# Patient Record
Sex: Female | Born: 1950 | Race: White | Hispanic: No | Marital: Married | Smoking: Former smoker
Health system: Southern US, Community
[De-identification: ages and names within clinical notes are randomized; demographics above are authoritative.]

## PROBLEM LIST (undated history)

## (undated) DIAGNOSIS — J309 Allergic rhinitis, unspecified: Secondary | ICD-10-CM

## (undated) DIAGNOSIS — F329 Major depressive disorder, single episode, unspecified: Secondary | ICD-10-CM

## (undated) DIAGNOSIS — G4700 Insomnia, unspecified: Secondary | ICD-10-CM

## (undated) DIAGNOSIS — I480 Paroxysmal atrial fibrillation: Secondary | ICD-10-CM

## (undated) DIAGNOSIS — E7800 Pure hypercholesterolemia, unspecified: Secondary | ICD-10-CM

## (undated) DIAGNOSIS — K449 Diaphragmatic hernia without obstruction or gangrene: Secondary | ICD-10-CM

## (undated) DIAGNOSIS — M1990 Unspecified osteoarthritis, unspecified site: Secondary | ICD-10-CM

## (undated) DIAGNOSIS — D649 Anemia, unspecified: Secondary | ICD-10-CM

## (undated) DIAGNOSIS — I272 Pulmonary hypertension, unspecified: Secondary | ICD-10-CM

## (undated) DIAGNOSIS — L981 Factitial dermatitis: Secondary | ICD-10-CM

## (undated) DIAGNOSIS — E785 Hyperlipidemia, unspecified: Secondary | ICD-10-CM

## (undated) DIAGNOSIS — M810 Age-related osteoporosis without current pathological fracture: Secondary | ICD-10-CM

## (undated) DIAGNOSIS — R079 Chest pain, unspecified: Secondary | ICD-10-CM

## (undated) DIAGNOSIS — F17200 Nicotine dependence, unspecified, uncomplicated: Secondary | ICD-10-CM

## (undated) DIAGNOSIS — E039 Hypothyroidism, unspecified: Secondary | ICD-10-CM

## (undated) DIAGNOSIS — M51379 Other intervertebral disc degeneration, lumbosacral region without mention of lumbar back pain or lower extremity pain: Secondary | ICD-10-CM

## (undated) DIAGNOSIS — N390 Urinary tract infection, site not specified: Secondary | ICD-10-CM

## (undated) DIAGNOSIS — J189 Pneumonia, unspecified organism: Secondary | ICD-10-CM

## (undated) DIAGNOSIS — D126 Benign neoplasm of colon, unspecified: Secondary | ICD-10-CM

## (undated) DIAGNOSIS — F419 Anxiety disorder, unspecified: Secondary | ICD-10-CM

## (undated) DIAGNOSIS — F3289 Other specified depressive episodes: Secondary | ICD-10-CM

## (undated) DIAGNOSIS — G894 Chronic pain syndrome: Secondary | ICD-10-CM

## (undated) DIAGNOSIS — R319 Hematuria, unspecified: Secondary | ICD-10-CM

## (undated) DIAGNOSIS — L281 Prurigo nodularis: Secondary | ICD-10-CM

## (undated) DIAGNOSIS — R609 Edema, unspecified: Secondary | ICD-10-CM

## (undated) DIAGNOSIS — R739 Hyperglycemia, unspecified: Secondary | ICD-10-CM

## (undated) DIAGNOSIS — E559 Vitamin D deficiency, unspecified: Secondary | ICD-10-CM

## (undated) DIAGNOSIS — IMO0002 Reserved for concepts with insufficient information to code with codable children: Secondary | ICD-10-CM

## (undated) DIAGNOSIS — Z87442 Personal history of urinary calculi: Secondary | ICD-10-CM

## (undated) DIAGNOSIS — K219 Gastro-esophageal reflux disease without esophagitis: Secondary | ICD-10-CM

## (undated) DIAGNOSIS — M5137 Other intervertebral disc degeneration, lumbosacral region: Secondary | ICD-10-CM

## (undated) DIAGNOSIS — M4120 Other idiopathic scoliosis, site unspecified: Secondary | ICD-10-CM

## (undated) DIAGNOSIS — M47817 Spondylosis without myelopathy or radiculopathy, lumbosacral region: Secondary | ICD-10-CM

## (undated) DIAGNOSIS — T148XXA Other injury of unspecified body region, initial encounter: Secondary | ICD-10-CM

## (undated) HISTORY — DX: Other idiopathic scoliosis, site unspecified: M41.20

## (undated) HISTORY — DX: Other specified depressive episodes: F32.89

## (undated) HISTORY — DX: Anxiety disorder, unspecified: F41.9

## (undated) HISTORY — DX: Edema, unspecified: R60.9

## (undated) HISTORY — DX: Paroxysmal atrial fibrillation: I48.0

## (undated) HISTORY — DX: Pulmonary hypertension, unspecified: I27.20

## (undated) HISTORY — DX: Spondylosis without myelopathy or radiculopathy, lumbosacral region: M47.817

## (undated) HISTORY — DX: Personal history of urinary calculi: Z87.442

## (undated) HISTORY — PX: CATARACT EXTRACTION, BILATERAL: SHX1313

## (undated) HISTORY — DX: Benign neoplasm of colon, unspecified: D12.6

## (undated) HISTORY — DX: Pneumonia, unspecified organism: J18.9

## (undated) HISTORY — PX: EYE SURGERY: SHX253

## (undated) HISTORY — DX: Other intervertebral disc degeneration, lumbosacral region: M51.37

## (undated) HISTORY — PX: OTHER SURGICAL HISTORY: SHX169

## (undated) HISTORY — DX: Vitamin D deficiency, unspecified: E55.9

## (undated) HISTORY — DX: Factitial dermatitis: L98.1

## (undated) HISTORY — DX: Hyperlipidemia, unspecified: E78.5

## (undated) HISTORY — DX: Anemia, unspecified: D64.9

## (undated) HISTORY — DX: Other injury of unspecified body region, initial encounter: T14.8XXA

## (undated) HISTORY — DX: Other intervertebral disc degeneration, lumbosacral region without mention of lumbar back pain or lower extremity pain: M51.379

## (undated) HISTORY — DX: Reserved for concepts with insufficient information to code with codable children: IMO0002

## (undated) HISTORY — DX: Hematuria, unspecified: R31.9

## (undated) HISTORY — DX: Gastro-esophageal reflux disease without esophagitis: K21.9

## (undated) HISTORY — DX: Insomnia, unspecified: G47.00

## (undated) HISTORY — DX: Age-related osteoporosis without current pathological fracture: M81.0

## (undated) HISTORY — DX: Unspecified osteoarthritis, unspecified site: M19.90

## (undated) HISTORY — DX: Urinary tract infection, site not specified: N39.0

## (undated) HISTORY — DX: Hyperglycemia, unspecified: R73.9

## (undated) HISTORY — DX: Pure hypercholesterolemia, unspecified: E78.00

## (undated) HISTORY — DX: Prurigo nodularis: L28.1

## (undated) HISTORY — DX: Chronic pain syndrome: G89.4

## (undated) HISTORY — DX: Hypothyroidism, unspecified: E03.9

## (undated) HISTORY — DX: Diaphragmatic hernia without obstruction or gangrene: K44.9

## (undated) HISTORY — PX: ABDOMINAL HYSTERECTOMY: SHX81

## (undated) HISTORY — DX: Chest pain, unspecified: R07.9

## (undated) HISTORY — DX: Major depressive disorder, single episode, unspecified: F32.9

## (undated) HISTORY — DX: Nicotine dependence, unspecified, uncomplicated: F17.200

## (undated) HISTORY — DX: Allergic rhinitis, unspecified: J30.9

---
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
## 1978-02-24 HISTORY — PX: CERVICAL SPINE SURGERY: SHX589

## 1978-02-24 HISTORY — PX: OTHER SURGICAL HISTORY: SHX169

## 1988-02-25 HISTORY — PX: VAGINAL HYSTERECTOMY: SHX2639

## 2004-02-25 HISTORY — PX: BUNIONECTOMY: SHX129

## 2009-01-08 ENCOUNTER — Ambulatory Visit: Payer: Self-pay | Admitting: Anesthesiology

## 2009-01-12 ENCOUNTER — Ambulatory Visit: Payer: Self-pay | Admitting: Anesthesiology

## 2009-01-29 ENCOUNTER — Ambulatory Visit: Payer: Self-pay | Admitting: Anesthesiology

## 2009-03-01 ENCOUNTER — Ambulatory Visit: Payer: Self-pay | Admitting: Anesthesiology

## 2009-03-30 ENCOUNTER — Ambulatory Visit: Payer: Self-pay | Admitting: Anesthesiology

## 2009-04-17 LAB — HM DEXA SCAN

## 2009-04-25 ENCOUNTER — Ambulatory Visit: Payer: Self-pay | Admitting: Family Medicine

## 2009-04-27 ENCOUNTER — Ambulatory Visit: Payer: Self-pay | Admitting: Anesthesiology

## 2009-05-15 ENCOUNTER — Ambulatory Visit: Payer: Self-pay | Admitting: Anesthesiology

## 2009-06-21 ENCOUNTER — Ambulatory Visit: Payer: Self-pay | Admitting: Anesthesiology

## 2009-07-02 ENCOUNTER — Ambulatory Visit: Payer: Self-pay | Admitting: Anesthesiology

## 2009-07-12 ENCOUNTER — Ambulatory Visit: Payer: Self-pay | Admitting: Family Medicine

## 2009-08-02 ENCOUNTER — Ambulatory Visit: Payer: Self-pay | Admitting: General Practice

## 2009-08-02 ENCOUNTER — Encounter: Payer: Self-pay | Admitting: Cardiovascular Disease

## 2009-08-08 ENCOUNTER — Encounter: Payer: Self-pay | Admitting: Cardiovascular Disease

## 2009-08-10 ENCOUNTER — Encounter: Payer: Self-pay | Admitting: Cardiovascular Disease

## 2009-08-10 ENCOUNTER — Ambulatory Visit: Payer: Self-pay | Admitting: Specialist

## 2009-08-22 ENCOUNTER — Ambulatory Visit: Payer: Self-pay | Admitting: Anesthesiology

## 2009-08-23 ENCOUNTER — Ambulatory Visit: Payer: Self-pay | Admitting: Specialist

## 2009-09-26 ENCOUNTER — Encounter: Payer: Self-pay | Admitting: Cardiovascular Disease

## 2009-10-04 ENCOUNTER — Ambulatory Visit: Payer: Self-pay | Admitting: Anesthesiology

## 2009-10-22 ENCOUNTER — Ambulatory Visit: Payer: Self-pay | Admitting: Family Medicine

## 2009-10-22 ENCOUNTER — Encounter: Payer: Self-pay | Admitting: Cardiovascular Disease

## 2009-11-13 ENCOUNTER — Ambulatory Visit: Payer: Self-pay | Admitting: Cardiovascular Disease

## 2009-11-13 DIAGNOSIS — R609 Edema, unspecified: Secondary | ICD-10-CM

## 2009-11-13 DIAGNOSIS — R079 Chest pain, unspecified: Secondary | ICD-10-CM

## 2009-11-13 DIAGNOSIS — R0602 Shortness of breath: Secondary | ICD-10-CM | POA: Insufficient documentation

## 2009-11-13 DIAGNOSIS — R5383 Other fatigue: Secondary | ICD-10-CM

## 2009-11-13 DIAGNOSIS — R5381 Other malaise: Secondary | ICD-10-CM | POA: Insufficient documentation

## 2009-11-13 DIAGNOSIS — E785 Hyperlipidemia, unspecified: Secondary | ICD-10-CM | POA: Insufficient documentation

## 2009-11-14 ENCOUNTER — Ambulatory Visit: Payer: Self-pay | Admitting: Family Medicine

## 2009-11-14 ENCOUNTER — Ambulatory Visit: Payer: Self-pay | Admitting: Anesthesiology

## 2009-12-03 ENCOUNTER — Ambulatory Visit: Payer: Self-pay | Admitting: Specialist

## 2009-12-20 ENCOUNTER — Ambulatory Visit: Payer: Self-pay | Admitting: Anesthesiology

## 2010-01-29 ENCOUNTER — Ambulatory Visit: Payer: Self-pay | Admitting: Family Medicine

## 2010-02-11 ENCOUNTER — Ambulatory Visit: Payer: Self-pay

## 2010-02-24 HISTORY — PX: OTHER SURGICAL HISTORY: SHX169

## 2010-03-26 NOTE — Letter (Signed)
Summary: [facility] - CT Abdomen & Pelvis W/WO  [facility] - CT Abdomen & Pelvis W/WO   Imported By: Marylou Mccoy 11/28/2009 12:01:11  _____________________________________________________________________  External Attachment:    Type:   Image     Comment:   External Document

## 2010-03-26 NOTE — Letter (Signed)
Summary: [facility] - [facility] Chest PA and LAT  [facility] - [facility] Chest PA and LAT   Imported By: Marylou Mccoy 11/28/2009 11:58:16  _____________________________________________________________________  External Attachment:    Type:   Image     Comment:   External Document

## 2010-03-26 NOTE — Letter (Signed)
Summary: [name] - Stress   [facility] - Stress   Imported By: Marylou Mccoy 11/28/2009 12:00:03  _____________________________________________________________________  External Attachment:    Type:   Image     Comment:   External Document

## 2010-03-26 NOTE — Assessment & Plan Note (Signed)
Summary: NEW PT   Visit Type:  Initial Consult Primary Provider:  Krisit Smith,M.D.  CC:  c/o swelling in legs and feet and ankles with shortness of breath.  Has chest pain that awakes her in Sonya night. .  History of Present Illness: Sonya [name] is a 60 year old woman with a long history of smoking who stopped 4 years ago, chronic back pain with Harrington rods placed, anemia with iron deficiency, emphysema seen on CT scan, recent pneumonia x2 in May and August of this year per her report with recent symptoms of swelling in her legs, previous workup with echocardiogram and stress test who presents for evaluation for fatigue, shortness of breath and edema.  She reports that her edema has improved on Lasix b.i.d. She does drink a significant amount of fluid. She's had a difficult year. She is seen by [facility] pain clinic and has cortisone shots in her back and neck. Her weight is up 30 pounds from her usual weight. She is very fatigued, has shortness of breath with exertion. She's also had rare episodes of chest pain lasting sometimes up to 10 minutes to come on while she is sleeping. She does not have any symptoms of chest pain with exertion. She reports having PFTs in Sonya past with Dr. Meredeth Ide though she is uncertain of Sonya results.  She does not do very much in Sonya daytime. When asked if she might be depressed, she certainly says that that could be possible.  Recent labs from August 3 showed total cholesterol 144, HDL 49, LDL 79, triglycerides 79  EKG shows normal sinus rhythm with rate of 82 beats per minute, no significant ST-T wave changes  stress test September 26 2009 showed no ischemia, poor exercise tolerance for her age, ejection fraction 71%  Echocardiogram done June 2011 shows normal systolic function, mild MR, normal right systolic function and pressure    Preventive Screening-Counseling & Management  Alcohol-Tobacco     Smoking Status: quit  Caffeine-Diet-Exercise     Does [name]  Exercise: yes  Current Medications (verified): 1)  Boniva 150 Mg Tabs (Ibandronate Sodium) .... Once A Month 2)  Synthroid 88 Mcg Tabs (Levothyroxine Sodium) .... One Tablet Once Daily 3)  Nexium 40 Mg Cpdr (Esomeprazole Magnesium) .... One Tablet Once Daily 4)  Furosemide 40 Mg Tabs (Furosemide) .... Two Tablets Once Daily 5)  Crestor 40 Mg Tabs (Rosuvastatin Calcium) .... One Tablet At Bedtime 6)  Klor-Con M20 20 Meq Cr-Tabs (Potassium Chloride Crys Cr) .... One Tablet Once Daily 7)  Iron 325 (65 Fe) Mg Tabs (Ferrous Sulfate) .... One Tablet Once Daily 8)  Caltrate 600+d 600-400 Mg-Unit Chew (Calcium Carbonate-Vitamin D) .... One Tablet Once Daily 9)  Stool Softener 100 Mg Caps (Docusate Sodium) .... One Tablet Once Daily 10)  Lyrica 75 Mg Caps (Pregabalin) .... One Tablet At Bedtime 11)  Klonopin 1 Mg Tabs (Clonazepam) .... Three Times A Day or Every 8 Hours 12)  Ultram 50 Mg Tabs (Tramadol Hcl) .... Two Tablets Four Times A Day 13)  Oxycontin 30 Mg Xr12h-Tab (Oxycodone Hcl) .... One Tablet Three Times A Day For Back Pain 14)  Amirix 15 Mg  Allergies (verified): 1)  ! Sulfa 2)  ! Levaquin  Past History:  Family History: Last updated: November 19, 2009 Father: CHF age 44 deceased Mother:living;pacemaker and CHF.   Siblings: 4 sisters living; 1 has a stent.                4 brothers living; 3 hx. of  CAD; 1 with CABG x 4 and Sonya others all have stents.  Social History: Last updated: 11/13/2009 Retired  Married  Tobacco Use - Former. quit Nov.2007. Regular Exercise - yes--recently not able to. Alcohol Use - no  Risk Factors: Exercise: yes (11/13/2009)  Risk Factors: Smoking Status: quit (11/13/2009)  Past Medical History: Hyperlipidemia emphysema anxiety arthritis GERD anemia hiatal hernia frequent UTI's  Past Surgical History: hysterectomy two harrington rods in back 1980  Family History: Father: CHF age 22 deceased Mother:living;pacemaker and  CHF.   Siblings: 4 sisters living; 1 has a stent.                4 brothers living; 3 hx. of CAD; 1 with CABG x 4 and Sonya others all have stents.  Social History: Retired  Married  Tobacco Use - Former. quit Nov.2007. Regular Exercise - yes--recently not able to. Alcohol Use - no Smoking Status:  quit Does [name] Exercise:  yes  Review of Systems       Sonya [name] complains of weight gain, chest pain, dyspnea on exertion, and peripheral edema.  Sonya [name] denies fever, weight loss, vision loss, decreased hearing, hoarseness, syncope, prolonged cough, abdominal pain, incontinence, muscle weakness, depression, and enlarged lymph nodes.    Vital Signs:  [name] profile:   60 year old [name] Height:      63 inches Weight:      179 pounds BMI:     31.82 Pulse rate:   83 / minute BP sitting:   117 / 79  (left arm) Cuff size:   regular  Vitals Entered By: Bishop Dublin, CMA (November 13, 2009 3:20 PM)  Physical Exam  General:  Well developed, well nourished, in no acute distress. Head:  normocephalic and atraumatic Neck:  Neck supple, no JVD. No masses, thyromegaly or abnormal cervical nodes. Lungs:  Clear bilaterally to auscultation and percussion. Heart:  Non-displaced PMI, chest non-tender; regular rate and rhythm, S1, S2 with I-II/VI SEM RSB, no rubs or gallops. Carotid upstroke normal, no bruit.  Pedals normal pulses. No edema, no varicosities. Abdomen:  abdomen soft and non-tender without masses Msk:  Back normal, normal gait. Muscle strength and tone normal. Pulses:  pulses normal in all 4 extremities Extremities:  No clubbing or cyanosis. Neurologic:  Alert and oriented x 3. Skin:  Intact without lesions or rashes. Psych:  Normal affect.   Impression & Recommendations:  Problem # 1:  FATIGUE / MALAISE (ICD-780.79) after a long discussion of her symptoms, her previous history and her care over Sonya past year, I suspect that there is no underlying cardiac issues  that needs to be addressed.  I am concerned about her weight gain of 30 pounds and how this has affected her conditioning, her breathing in Sonya setting of underlying emphysema. She is not exercising. She has had long periods of sedentary activity given her recent pneumonias. I've encouraged her to increase her activity as tolerated now that Sonya weather has improved. I believe Sonya hot weather may have also affected her breathing. She does have some symptoms concerning for depression and I will defer this to Dr. Katrinka Blazing.  Her cholesterol is well controlled on Crestor 40 mg daily. She did mention that she would like to change back to Lipitor. We will start her on aspirin 81 mg daily.   Problem # 2:  CHEST PAIN-UNSPECIFIED (ICD-786.50) etiology of her chest pain is likely noncardiac. She has no symptoms with exertion. She has had rare episodes of chest  pain coming on at rest waking her from sleep it lasted for up to 10 minutes. We have given her a prescription for nitroglycerin to be taken p.r.n. Asked her to contact me if she has more frequent  episodes of Chest discomfort.  Her updated medication list for this problem includes:    Nitrostat 0.4 Mg Subl (Nitroglycerin) .Marland Kitchen... 1 tablet under tongue at onset of chest pain; you may repeat every 5 minutes for up to 3 doses.    Aspirin 81 Mg Tbec (Aspirin) .Marland Kitchen... Take one tablet by mouth daily  Problem # 3:  EDEMA (ICD-782.3) she does drink a significant amount of fluid and I states that she cut back on her walker intake. Currently she has no edema today. She made be able to take one Lasix today with a second Lasix p.r.n. for edema  Problem # 4:  DYSPNEA (ICD-786.05) I did talk to her about her shortness of breath. I feel she is deconditioned, has underlying emphysema, has had a 30 pound weight gain which is affecting her conditioning. I've encouraged her to start walking more now that Sonya weather is improved. She may need a long-acting steroid or beta  inhaler.  Her updated medication list for this problem includes:    Furosemide 40 Mg Tabs (Furosemide) .Marland Kitchen..Marland Kitchen Two tablets once daily    Aspirin 81 Mg Tbec (Aspirin) .Marland Kitchen... Take one tablet by mouth daily  [name] Instructions: 1)  Your physician has recommended you make Sonya following change in your medication: Nitro for chest pain, START aspirin 81mg   2)  Your physician wants you to follow-up in:   6 months You will receive a reminder letter in Sonya mail two months in advance. If you don't receive a letter, please call our office to schedule Sonya follow-up appointment. Prescriptions: NITROSTAT 0.4 MG SUBL (NITROGLYCERIN) 1 tablet under tongue at onset of chest pain; you may repeat every 5 minutes for up to 3 doses.  #25 x 2   Entered by:   Benedict Needy, RN   Authorized by:   Dossie Arbour MD   Signed by:   Benedict Needy, RN on 11/13/2009   Method used:   Print then Give to [name]   RxID:   [id number] CRESTOR 40 MG TABS (ROSUVASTATIN CALCIUM) one tablet at bedtime  #30 x 12   Entered by:   Benedict Needy, RN   Authorized by:   Dossie Arbour MD   Signed by:   Benedict Needy, RN on 11/13/2009   Method used:   Print then Give to [name]   RxID:   [id number]

## 2010-03-26 NOTE — Letter (Signed)
Summary: [facility] - [place] Color Flow Doppler Low Extrem  [facility] - [place] Color Flow Doppler Low Extrem   Imported By: Marylou Mccoy 11/28/2009 11:59:11  _____________________________________________________________________  External Attachment:    Type:   Image     Comment:   External Document

## 2010-03-26 NOTE — Letter (Signed)
Summary: [name] - [place]  [facility] - [place]   Imported By: Marylou Mccoy 11/28/2009 14:30:00  _____________________________________________________________________  External Attachment:    Type:   Image     Comment:   External Document

## 2010-03-27 ENCOUNTER — Ambulatory Visit: Payer: Self-pay | Admitting: Specialist

## 2010-04-11 LAB — HM PAP SMEAR

## 2010-04-30 ENCOUNTER — Ambulatory Visit: Payer: Self-pay | Admitting: Family Medicine

## 2010-06-24 ENCOUNTER — Ambulatory Visit: Payer: Self-pay | Admitting: Unknown Physician Specialty

## 2010-09-18 ENCOUNTER — Encounter: Payer: Self-pay | Admitting: Cardiovascular Disease

## 2011-06-16 ENCOUNTER — Ambulatory Visit: Payer: Self-pay | Admitting: Physical Medicine and Rehabilitation

## 2011-07-31 LAB — HM DEXA SCAN

## 2011-07-31 LAB — HEMOGLOBIN A1C: Hgb A1c MFr Bld: 5.9 % (ref 4.0–6.0)

## 2011-07-31 LAB — LIPID PANEL
Cholesterol: 180 mg/dL (ref 0–200)
HDL: 52 mg/dL (ref 35–70)

## 2011-09-04 ENCOUNTER — Ambulatory Visit: Payer: Self-pay | Admitting: Family Medicine

## 2011-09-04 LAB — HM MAMMOGRAPHY

## 2011-11-07 ENCOUNTER — Encounter: Payer: Self-pay | Admitting: *Deleted

## 2011-11-10 ENCOUNTER — Encounter: Payer: Self-pay | Admitting: Family Medicine

## 2011-11-10 ENCOUNTER — Ambulatory Visit (INDEPENDENT_AMBULATORY_CARE_PROVIDER_SITE_OTHER): Payer: BC Managed Care – PPO | Admitting: Family Medicine

## 2011-11-10 VITALS — BP 140/83 | HR 84 | Temp 97.8°F | Resp 18 | Ht 62.0 in | Wt 189.0 lb

## 2011-11-10 DIAGNOSIS — J309 Allergic rhinitis, unspecified: Secondary | ICD-10-CM

## 2011-11-10 DIAGNOSIS — M8580 Other specified disorders of bone density and structure, unspecified site: Secondary | ICD-10-CM

## 2011-11-10 DIAGNOSIS — R7302 Impaired glucose tolerance (oral): Secondary | ICD-10-CM

## 2011-11-10 DIAGNOSIS — E039 Hypothyroidism, unspecified: Secondary | ICD-10-CM

## 2011-11-10 DIAGNOSIS — L981 Factitial dermatitis: Secondary | ICD-10-CM

## 2011-11-10 DIAGNOSIS — Z23 Encounter for immunization: Secondary | ICD-10-CM

## 2011-11-10 DIAGNOSIS — J449 Chronic obstructive pulmonary disease, unspecified: Secondary | ICD-10-CM | POA: Insufficient documentation

## 2011-11-10 DIAGNOSIS — R609 Edema, unspecified: Secondary | ICD-10-CM

## 2011-11-10 DIAGNOSIS — E785 Hyperlipidemia, unspecified: Secondary | ICD-10-CM

## 2011-11-10 NOTE — Progress Notes (Signed)
Subjective:    Patient ID: Sonya [name], female    DOB: 09/10/1950, 61 y.o.   MRN: [id number]  HPIThis 61 y.o. female presents for evaluation of the following:  1.  Neurotic Excoriations:  Appointment with dermatology this week.  Started developing lesions on BLE.  Lucila Maine was climbing on patient over the weekend; wrapped legs last.  Feeling like getting worse.  Painful.  Initial consult with [facility].  Now has transferred to [facility].  Very tender and raw.   Concerned about melanoma.  Usually keeps arms wrapped.  When returned from [place] two weeks ago, L arm almost clear.  Will bump arms and large wound will develop.  Kendell Bane is closer.  Started six months to 12 months ago.   No itching.  +drainage.  Applying vaseline and then wrapping.  Not currently taking Doxycycline.   2.  Edema: swollen today.  No swelling yesterday.  +SOB this morning. Usually taking Lasix once daily; with bad day, will take second dose.   Did go out and mow yesterday.    3.  COPD:  Follow-up with Meredeth Ide on December 01, 2011.  Feels really stuffy and short of breath.  Agreeable to flu vaccine.  Followed every eight months.    4.  Constipation: having horrible constipation.  Did have small bowel movement today; first bm in four days.  Taking stool softener scheduled.  No laxative use.  Drinking plenty of water.   Chronic issue with constipation with recent worsening.  No abdominal pain but +bloating.  5. DDD Lumbar Spine:  Follow up with [facility] this week.  Followed every three months.  Stable.  6.  Anxiety and depression:  Next appointment with [facility] this month.  Every three months.  Stable.  7.  Hypercholesterolemia: three month follow-up; no change in management at last visit.  Good compliance with medication, good tolerance to medication; good symptom control.  Denies chest pain, palpitations, dizziness, focal weakness, or paresthesias.  8. 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      Review of Systems  Constitutional: Positive for fatigue. Negative for fever, chills and diaphoresis.  HENT: Positive for congestion, sneezing and postnasal drip. Negative for rhinorrhea.   Respiratory: Positive for cough and shortness of breath. Negative for wheezing.   Cardiovascular: Positive for leg swelling. Negative for chest pain and palpitations.  Gastrointestinal: Positive for constipation and abdominal distention. Negative for nausea, vomiting, abdominal pain and diarrhea.  Musculoskeletal: Positive for back pain.  Skin: Positive for color change, rash and wound.  Neurological: Negative for dizziness, facial asymmetry, light-headedness, numbness and headaches.  Hematological: Bruises/bleeds easily.  Psychiatric/Behavioral: The patient is nervous/anxious.        Objective:   Physical Exam  Nursing note and vitals reviewed. Constitutional: She is oriented to person, place, and time. She appears well-developed and well-nourished. No distress.  HENT:  Head: Normocephalic and atraumatic.  Eyes: Conjunctivae normal are normal. Pupils are equal, round, and reactive to light.  Neck: Normal range of motion. Neck supple. No thyromegaly present.  Cardiovascular: Normal rate, regular rhythm, normal heart sounds and intact distal pulses.   No murmur heard. Pulmonary/Chest: Effort normal and breath sounds normal. No respiratory distress. She has no wheezes. She has no rales.  Abdominal: Soft. Bowel sounds are normal. She exhibits no distension. There is no tenderness. There is no rebound and no guarding.  Lymphadenopathy:    She has no cervical adenopathy.  Neurological: She is alert and oriented to  person, place, and time. No cranial nerve deficit. She exhibits normal muscle tone.  Skin: Skin is warm and dry. She is not diaphoretic.       B FOREARMS:  2CM X 3CM AREA OF HEALING ESCHAR WITH MINIMAL ERYTHEMA PRESENT.  MULTIPLE SCATTERED LESIONS B FOREARMS.     LLE:  4 DISCRETE AREAS DIAMETER OF HEALING ESCHAR IN ANNULAR DISTRIBUTION.   RLE: LINEAR EXCORIATIONS MULTIPLE LATERALLY.  Psychiatric: She has a normal mood and affect. Her behavior is normal. Judgment and thought content normal.   FLU VACCINE ADMINISTERED.     Assessment & Plan:   1. Need for influenza vaccination  Flu vaccine greater than or equal to 3yo preservative free IM  2. COPD (chronic obstructive pulmonary disease)    3. Neurotic excoriations    4. Edema    5. Allergic rhinitis, cause unspecified    6.  Hypercholesterolemia. 7. Glucose Intolerance  1.  COPD:  Stable; followed by pulmonology; no change in management at this time; stable pulse oximetry at 95%.   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  Pt concerned of other etiology; insists that she is not scratching or picking skin. Follow up with dermatology this week. Continue to wrap forearms and legs.  3.  Edema: Chronic with worsening; to increase Lasix to bid for next several days.   4.  Allergic Rhinitis: worsening; to continue with Allegra and Flonase; to add nasal saline spray PRN. 5.  S/p influenza vaccine. 6.  Hypercholesterolemia: controlled at last visit; RTC three months for fasting labs. 7.  Glucose Intolerance:  Stable; repeat labs at next visit; continue with attempts at weight loss, exercise, low-carb food choices.

## 2011-11-11 ENCOUNTER — Encounter: Payer: Self-pay | Admitting: Family Medicine

## 2011-11-11 NOTE — Progress Notes (Signed)
Reviewed and agree.

## 2011-11-13 ENCOUNTER — Encounter: Payer: Self-pay | Admitting: *Deleted

## 2011-11-26 ENCOUNTER — Encounter: Payer: Self-pay | Admitting: Family Medicine

## 2012-01-01 ENCOUNTER — Telehealth: Payer: Self-pay

## 2012-01-01 MED ORDER — POTASSIUM CHLORIDE CRYS ER 20 MEQ PO TBCR: 20.0000 meq | EXTENDED_RELEASE_TABLET | Freq: Every day | ORAL | Status: DC

## 2012-01-01 NOTE — Telephone Encounter (Signed)
Rx sent 

## 2012-01-01 NOTE — Telephone Encounter (Signed)
PATIENT NEEDS A REFILL ON potassium chloride SA (K-DUR,KLOR-CON) 20 MEQ tablet TO FAX TO [facility]. PATIENT SEES DR.K SMITH. THANK YOU!

## 2012-01-03 NOTE — Telephone Encounter (Signed)
Advised pt that rx was sent in

## 2012-01-14 ENCOUNTER — Ambulatory Visit: Payer: Self-pay | Admitting: Orthopedic Surgery

## 2012-01-19 ENCOUNTER — Ambulatory Visit: Payer: Self-pay | Admitting: Specialist

## 2012-02-06 ENCOUNTER — Ambulatory Visit: Payer: Self-pay | Admitting: Orthopedic Surgery

## 2012-02-09 ENCOUNTER — Encounter: Payer: Self-pay | Admitting: Family Medicine

## 2012-02-09 ENCOUNTER — Ambulatory Visit (INDEPENDENT_AMBULATORY_CARE_PROVIDER_SITE_OTHER): Payer: BC Managed Care – PPO | Admitting: Family Medicine

## 2012-02-09 VITALS — BP 110/70 | HR 88 | Temp 98.2°F | Resp 16 | Ht 62.0 in | Wt 188.0 lb

## 2012-02-09 DIAGNOSIS — R7309 Other abnormal glucose: Secondary | ICD-10-CM

## 2012-02-09 DIAGNOSIS — R609 Edema, unspecified: Secondary | ICD-10-CM

## 2012-02-09 DIAGNOSIS — R7302 Impaired glucose tolerance (oral): Secondary | ICD-10-CM

## 2012-02-09 DIAGNOSIS — E7800 Pure hypercholesterolemia, unspecified: Secondary | ICD-10-CM

## 2012-02-09 DIAGNOSIS — J449 Chronic obstructive pulmonary disease, unspecified: Secondary | ICD-10-CM

## 2012-02-09 DIAGNOSIS — M1710 Unilateral primary osteoarthritis, unspecified knee: Secondary | ICD-10-CM

## 2012-02-09 DIAGNOSIS — M170 Bilateral primary osteoarthritis of knee: Secondary | ICD-10-CM

## 2012-02-09 DIAGNOSIS — E785 Hyperlipidemia, unspecified: Secondary | ICD-10-CM

## 2012-02-09 DIAGNOSIS — L981 Factitial dermatitis: Secondary | ICD-10-CM

## 2012-02-09 LAB — LIPID PANEL
HDL: 45 mg/dL (ref >39)
LDL Cholesterol: 106 mg/dL — ABNORMAL HIGH (ref 0–99)
Triglycerides: 145 mg/dL (ref <150)

## 2012-02-09 LAB — CBC WITH DIFFERENTIAL/PLATELET
Basophils Absolute: 0 10*3/uL (ref 0.0–0.1)
HCT: 42.1 % (ref 36.0–46.0)
Lymphocytes Relative: 19 % (ref 12–46)
Neutro Abs: 6.1 10*3/uL (ref 1.7–7.7)
Platelets: 343 10*3/uL (ref 150–400)
RBC: 4.73 MIL/uL (ref 3.87–5.11)
RDW: 13.9 % (ref 11.5–15.5)
WBC: 8.5 10*3/uL (ref 4.0–10.5)

## 2012-02-09 LAB — COMPREHENSIVE METABOLIC PANEL
ALT: 23 U/L (ref 0–35)
AST: 27 U/L (ref 0–37)
Albumin: 4.2 g/dL (ref 3.5–5.2)
Alkaline Phosphatase: 92 U/L (ref 39–117)
Potassium: 4.1 mEq/L (ref 3.5–5.3)
Sodium: 141 mEq/L (ref 135–145)
Total Protein: 6 g/dL (ref 6.0–8.3)

## 2012-02-09 MED ORDER — POTASSIUM CHLORIDE CRYS ER 20 MEQ PO TBCR: 20.0000 meq | EXTENDED_RELEASE_TABLET | Freq: Every day | ORAL | Status: DC

## 2012-02-09 MED ORDER — LEVOTHYROXINE SODIUM 88 MCG PO TABS: 88.0000 ug | ORAL_TABLET | Freq: Every day | ORAL | Status: DC

## 2012-02-09 MED ORDER — FUROSEMIDE 40 MG PO TABS: 40.0000 mg | ORAL_TABLET | Freq: Every day | ORAL | Status: DC

## 2012-02-09 MED ORDER — ESOMEPRAZOLE MAGNESIUM 40 MG PO CPDR: 40.0000 mg | DELAYED_RELEASE_CAPSULE | Freq: Every day | ORAL | Status: DC

## 2012-02-09 MED ORDER — ATORVASTATIN CALCIUM 40 MG PO TABS: 40.0000 mg | ORAL_TABLET | Freq: Every day | ORAL | Status: DC

## 2012-02-09 NOTE — Progress Notes (Signed)
[address]   [place], [place]  [address]   [phone]  Subjective:    Patient ID: Sonya [name], female    DOB: 08-Jul-1950, 61 y.o.   MRN: [id number]  HPIThis 61 y.o. female presents for three month follow-up:  1.  Hyperlipidemia:  Fasting.  Six month follow-up; no changes to management made at last visit.  Reports compliance with medication; good tolerance to medication; good symptom control. Denies chest pain, palpitations, worsening SOB, worsening leg swelling. Denies HA, dizziness, focal weakness, paresthesias.  2.  Glucose Intolerance:  Fasting.  Six month follow-up; continues to gain weight.    3. COPD:  S/p evaluation by Meredeth Ide in past three months; repeated sleep study; did nocturnal oximetry with concerning findings.  No evidence of OSA; no need for CPAP.  No changes to management made at pulmonology visit.  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  Healing wounds LLE, RLE.   S/p dermatology follow-up since last visit; has upcoming appointment as well.  5. DDD Lumbar:  Appointment with [facility] today at 12:45.  Issues stable.  6. Edema:  Stable since last visit without worsening.  Denies orthopnea, chest pain, palpitations.  7. OA knees:  Going to have TKR as soon as can be scheduled; also has torn meniscus.   S/p Synvisc in B knees.  Scared to have knee replacement by Rosita Kea.  Needs pre-operative clearance.  No chest pains, palpitations.  +SOB due to COPD; DOE depends on the day. Has steep steps at home; sixteen steps in home; double two story steps; able to walk up steps without stopping.  Recent follow-up with Meredeth Ide for COPD.  S/p MRI knees.  No spirometry since 02/2011.     Review of Systems  Constitutional: Negative for fever, chills, diaphoresis and fatigue.  Respiratory: Positive for shortness of breath. Negative for apnea, cough, chest tightness, wheezing and stridor.   Cardiovascular: Positive for leg swelling. Negative for chest  pain and palpitations.  Gastrointestinal: Positive for constipation. Negative for nausea, vomiting, abdominal pain, diarrhea, blood in stool, abdominal distention and anal bleeding.  Musculoskeletal: Positive for back pain and arthralgias.  Skin: Positive for rash and wound.        Past Medical History  Diagnosis Date  . HLD (hyperlipidemia)   . Emphysema   . Anxiety   . Arthritis   . GERD (gastroesophageal reflux disease)   . Anemia   . Hiatal hernia   . UTI (urinary tract infection)     frequent  . Unspecified hypothyroidism   . Hyperglycemia   . Neurotic excoriations   . Squamous cell carcinoma   . Depressive disorder, not elsewhere classified   . Bruising   . Benign neoplasm of colon   . Degeneration of lumbar or lumbosacral intervertebral disc   . Personal history of urinary calculi   . Scoliosis (and kyphoscoliosis), idiopathic   . Tobacco use disorder   . Osteoporosis, unspecified   . Insomnia, unspecified   . Edema   . Pure hypercholesterolemia   . Chronic pain syndrome   . Lumbosacral spondylosis without myelopathy   . Allergic rhinitis, cause unspecified   . Hematuria, unspecified   . Unspecified vitamin D deficiency   . Prurigo nodularis   . Pneumonia     Past Surgical History  Procedure Date  . Vaginal hysterectomy 1990  . 2 harrington rods in back 1980  . Tonsillectomy and adenoidectomy 1964  . [place]ervical spine surgery 1980  [place]  . [place] spine surgery 1980    [place]  . Squamous cell carcinoma resection 02/2010    [facility]  . Bunionectomy 2006    both feet    Prior to Admission medications   Medication Sig Start Date End Date Taking? Authorizing Provider  albuterol (PROVENTIL HFA;VENTOLIN HFA) 108 (90 BASE) MCG/ACT inhaler Inhale 2 puffs into the lungs every 6 (six) hours as needed.   Yes Historical Provider, MD  albuterol (PROVENTIL) (2.5 MG/3ML) 0.083% nebulizer solution Take 2.5 mg by nebulization every 6 (six) hours as needed.    Yes Historical Provider, MD  atorvastatin (LIPITOR) 40 MG tablet Take 40 mg by mouth at bedtime.   Yes Historical Provider, MD  clonazePAM (KLONOPIN) 1 MG tablet Take 1 mg by mouth 2 (two) times daily as needed.    Yes Historical Provider, MD  docusate sodium (COLACE) 100 MG capsule Take 100 mg by mouth daily.     Yes Historical Provider, MD  DULoxetine (CYMBALTA) 60 MG capsule Take 30 mg by mouth daily. 3 tabs qd   Yes Historical Provider, MD  esomeprazole (NEXIUM) 40 MG capsule Take 40 mg by mouth daily.     Yes Historical Provider, MD  fexofenadine (ALLEGRA) 180 MG tablet Take 180 mg by mouth daily.   Yes Historical Provider, MD  fluticasone (FLONASE) 50 MCG/ACT nasal spray Place 2 sprays into the nose daily.   Yes Historical Provider, MD  furosemide (LASIX) 40 MG tablet Take 40 mg by mouth daily.     Yes Historical Provider, MD  HYDROcodone-acetaminophen (NORCO) 10-325 MG per tablet Take 1 tablet by mouth every 6 (six) hours as needed.   Yes Historical Provider, MD  levothyroxine (SYNTHROID, LEVOTHROID) 88 MCG tablet Take 88 mcg by mouth daily.     Yes Historical Provider, MD  meloxicam (MOBIC) 15 MG tablet Take 15 mg by mouth daily.   Yes Historical Provider, MD  nitroGLYCERIN (NITROSTAT) 0.4 MG SL tablet Place 0.4 mg under the tongue every 5 (five) minutes as needed.     Yes Historical Provider, MD  OXYGEN-HELIUM IN Inhale 2 L into the lungs at bedtime.   Yes Historical Provider, MD  potassium chloride SA (K-DUR,KLOR-CON) 20 MEQ tablet Take 1 tablet (20 mEq total) by mouth daily. 01/01/12  Yes Chelle S Jeffery, PA-C  traMADol (ULTRAM) 50 MG tablet Take 100 mg by mouth 4 (four) times daily.     Yes Historical Provider, MD  zolpidem (AMBIEN) 10 MG tablet Take 10 mg by mouth at bedtime as needed.   Yes Historical Provider, MD  Calcium Carbonate-Vitamin D (CALTRATE 600+D) 600-400 MG-UNIT per chew tablet Chew 1 tablet by mouth daily.      Historical Provider, MD  cyclobenzaprine (AMRIX) 15 MG 24 hr  capsule Take 15 mg by mouth daily.      Historical Provider, MD  doxycycline (VIBRAMYCIN) 100 MG capsule Take 100 mg by mouth 2 (two) times daily.    Historical Provider, MD  ibandronate (BONIVA) 150 MG tablet Take 150 mg by mouth every 30 (thirty) days. Take in the morning with a full glass of water, on an empty stomach, and do not take anything else by mouth or lie down for the next 30 min.     Historical Provider, MD  oxycodone (OXYCONTIN) 30 MG TB12 Take 30 mg by mouth 3 (three) times daily.      Historical Provider, MD  pregabalin (LYRICA) 75 MG capsule Take 75 mg by mouth at bedtime.  Historical Provider, MD  rosuvastatin (CRESTOR) 40 MG tablet Take 40 mg by mouth at bedtime.      Historical Provider, MD  triamcinolone cream (KENALOG) 0.1 % Apply topically 2 (two) times daily.    Historical Provider, MD    Allergies  Allergen Reactions  . Levofloxacin   . Sulfonamide Derivatives     History   Social History  . Marital Status: Married    Spouse Name: N/A    Number of Children: 1  . Years of Education: 12   Occupational History  . disabled     24   DDD   Social History Main Topics  . Smoking status: Former Smoker -- 1.0 packs/day for 25 years    Types: Cigarettes  . Smokeless tobacco: Not on file     Comment: quit 11/07  . Alcohol Use: No  . Drug Use: Not on file  . Sexually Active: Not on file   Other Topics Concern  . Not on file   Social History Narrative   Married, retired, gets regular exercise - recently not able to. Lives with spouse;married 1992 (second marriage) happily married; no abuse. 1 daughter, 3 stepchildren, 8 grandchildren, 1 gg. Caffeine use cosumes a moderate amount 1 serving per day. Always uses seat belts. No guns in home. Smoke alarm and carbon monoxide detectors in the home. No Living Will.    Family History  Problem Relation Age of Onset  . Coronary artery disease Brother     stents    SIster stents also  . Heart failure Brother      CABG  AMI @ 58  . Heart failure Mother     pacemaker  . Heart failure Father     AMI  . Cancer Father     colon  . GI problems      Family history malignancy, GI tract    Objective:   Physical Exam  Nursing note and vitals reviewed. Constitutional: She is oriented to person, place, and time. She appears well-developed and well-nourished. No distress.  HENT:  Head: Normocephalic and atraumatic.  Mouth/Throat: Oropharynx is clear and moist.  Eyes: Conjunctivae normal and EOM are normal. Pupils are equal, round, and reactive to light.  Neck: Normal range of motion. Neck supple. No thyromegaly present.  Cardiovascular: Normal rate, regular rhythm, normal heart sounds and intact distal pulses.  Exam reveals no gallop and no friction rub.   No murmur heard. Pulmonary/Chest: Effort normal and breath sounds normal. She has no wheezes. She has no rales.  Abdominal: Soft. Bowel sounds are normal. She exhibits no distension and no mass. There is no tenderness. There is no rebound and no guarding.  Lymphadenopathy:    She has no cervical adenopathy.  Neurological: She is alert and oriented to person, place, and time. No cranial nerve deficit. She exhibits normal muscle tone. Coordination normal.  Skin: Skin is warm and dry. She is not diaphoretic.       Healing eschars B legs.  Psychiatric: She has a normal mood and affect. Her behavior is normal. Judgment and thought content normal.   EKG:  NSR; NO ST CHANGES.     Assessment & Plan:   1. Pure hypercholesterolemia  CK, Lipid panel  2. Other abnormal glucose  CBC with Differential, Comprehensive metabolic panel, Hemoglobin A1c  3. Osteoarthritis of both knees    4. Edema    5. COPD (chronic obstructive pulmonary disease)

## 2012-02-09 NOTE — Assessment & Plan Note (Signed)
Stable.  To receive clearance from [facility] for upcoming total knee replacement.

## 2012-02-09 NOTE — Assessment & Plan Note (Signed)
Stable; refills provided.

## 2012-02-09 NOTE — Assessment & Plan Note (Signed)
Persistent; followed closely by [facility] dermatology.

## 2012-02-09 NOTE — Assessment & Plan Note (Signed)
Stable; obtain labs.   Recommend dietary modification.  Continues to gain weight due to OA, DDD and lack of exercise.

## 2012-02-09 NOTE — Patient Instructions (Addendum)
1. Pure hypercholesterolemia  CK, Lipid panel  2. Other abnormal glucose  CBC with Differential, Comprehensive metabolic panel, Hemoglobin A1c

## 2012-02-09 NOTE — Assessment & Plan Note (Signed)
Worsening; to be scheduled for knee replacement by Rosita Kea.  Surgical clearance today; EKG stable; able to ambulate up two flights of stairs with moderate dyspnea.  To be further cleared from respiratory standpoint by [facility].  S/p recent sleep study.

## 2012-02-09 NOTE — Assessment & Plan Note (Signed)
Controlled; obtain labs; continue current medications. 

## 2012-02-10 LAB — HEMOGLOBIN A1C: Hgb A1c MFr Bld: 5.8 % — ABNORMAL HIGH (ref <5.7)

## 2012-03-01 ENCOUNTER — Telehealth: Payer: Self-pay

## 2012-03-01 ENCOUNTER — Ambulatory Visit: Payer: Self-pay | Admitting: Orthopedic Surgery

## 2012-03-01 LAB — CBC
HCT: 39 % (ref 35.0–47.0)
MCH: 30.8 pg (ref 26.0–34.0)
MCHC: 33.5 g/dL (ref 32.0–36.0)
RBC: 4.24 10*6/uL (ref 3.80–5.20)
RDW: 14.4 % (ref 11.5–14.5)
WBC: 7 10*3/uL (ref 3.6–11.0)

## 2012-03-01 LAB — BASIC METABOLIC PANEL
BUN: 14 mg/dL (ref 7–18)
Chloride: 108 mmol/L — ABNORMAL HIGH (ref 98–107)
Co2: 28 mmol/L (ref 21–32)
Creatinine: 0.93 mg/dL (ref 0.60–1.30)
EGFR (African American): >60
EGFR (Non-African Amer.): >60
Glucose: 90 mg/dL (ref 65–99)
Osmolality: 287 (ref 275–301)

## 2012-03-01 LAB — MRSA PCR SCREENING

## 2012-03-01 LAB — SEDIMENTATION RATE: Erythrocyte Sed Rate: 10 mm/hr (ref 0–30)

## 2012-03-01 NOTE — Telephone Encounter (Signed)
[facility] FROM [facility] STATES THEY ARE A PART OF [facility], SO THEY NEED [place] TO FAX PT'S RECORDS TO THEM BECAUSE SHE IS HAVING SURGERY NEXT WEEK. PLEASE FAX TO [phone] AND THE PHONE NUMBER IS [phone]

## 2012-03-01 NOTE — Telephone Encounter (Signed)
Records sent thru Epic °

## 2012-03-16 ENCOUNTER — Inpatient Hospital Stay: Payer: Self-pay | Admitting: Orthopedic Surgery

## 2012-03-16 HISTORY — PX: JOINT REPLACEMENT: SHX530

## 2012-03-17 LAB — BASIC METABOLIC PANEL
Anion Gap: 7 (ref 7–16)
BUN: 7 mg/dL (ref 7–18)
Calcium, Total: 8.3 mg/dL — ABNORMAL LOW (ref 8.5–10.1)
Co2: 27 mmol/L (ref 21–32)
Creatinine: 0.85 mg/dL (ref 0.60–1.30)
EGFR (Non-African Amer.): >60
Glucose: 120 mg/dL — ABNORMAL HIGH (ref 65–99)
Osmolality: 280 (ref 275–301)

## 2012-03-17 LAB — HEMOGLOBIN: HGB: 11 g/dL — ABNORMAL LOW (ref 12.0–16.0)

## 2012-03-17 LAB — PLATELET COUNT: Platelet: 217 10*3/uL (ref 150–440)

## 2012-03-18 LAB — URINALYSIS, COMPLETE
Bilirubin,UR: NEGATIVE
Glucose,UR: NEGATIVE mg/dL (ref 0–75)
Ketone: NEGATIVE
Nitrite: NEGATIVE
WBC UR: 4 /HPF (ref 0–5)

## 2012-03-19 LAB — CBC WITH DIFFERENTIAL/PLATELET
Basophil #: 0.1 10*3/uL (ref 0.0–0.1)
Basophil %: 0.8 %
HCT: 29.8 % — ABNORMAL LOW (ref 35.0–47.0)
Lymphocyte #: 1.3 10*3/uL (ref 1.0–3.6)
MCH: 30.3 pg (ref 26.0–34.0)
MCV: 90 fL (ref 80–100)

## 2012-03-19 LAB — RAPID INFLUENZA A&B ANTIGENS

## 2012-03-20 LAB — CBC WITH DIFFERENTIAL/PLATELET
Basophil #: 0 10*3/uL (ref 0.0–0.1)
Eosinophil #: 0.1 10*3/uL (ref 0.0–0.7)
Eosinophil %: 1.2 %
HCT: 26.8 % — ABNORMAL LOW (ref 35.0–47.0)
Lymphocyte #: 1.2 10*3/uL (ref 1.0–3.6)
Lymphocyte %: 14.9 %
MCH: 30.9 pg (ref 26.0–34.0)
MCHC: 34.2 g/dL (ref 32.0–36.0)
MCV: 90 fL (ref 80–100)
Monocyte %: 8.5 %
Neutrophil %: 75.1 %
Platelet: 209 10*3/uL (ref 150–440)
WBC: 8 10*3/uL (ref 3.6–11.0)

## 2012-03-22 LAB — CBC WITH DIFFERENTIAL/PLATELET
Basophil %: 1 %
Eosinophil #: 0.2 10*3/uL (ref 0.0–0.7)
HCT: 25.8 % — ABNORMAL LOW (ref 35.0–47.0)
Lymphocyte %: 12.9 %
MCH: 30.2 pg (ref 26.0–34.0)
MCHC: 33.4 g/dL (ref 32.0–36.0)
Monocyte %: 7.9 %
Neutrophil %: 76.4 %
Platelet: 229 10*3/uL (ref 150–440)
RBC: 2.85 10*6/uL — ABNORMAL LOW (ref 3.80–5.20)
RDW: 14.1 % (ref 11.5–14.5)

## 2012-03-22 LAB — URINALYSIS, COMPLETE
Bacteria: NONE SEEN
Bilirubin,UR: NEGATIVE
Glucose,UR: NEGATIVE mg/dL (ref 0–75)
Ketone: NEGATIVE
Nitrite: NEGATIVE
Protein: NEGATIVE
RBC,UR: <1 /HPF (ref 0–5)
Squamous Epithelial: 1

## 2012-03-22 LAB — BASIC METABOLIC PANEL
Anion Gap: 12 (ref 7–16)
BUN: 8 mg/dL (ref 7–18)
Chloride: 103 mmol/L (ref 98–107)
Co2: 22 mmol/L (ref 21–32)
Creatinine: 0.64 mg/dL (ref 0.60–1.30)
EGFR (African American): >60
EGFR (Non-African Amer.): >60
Glucose: 103 mg/dL — ABNORMAL HIGH (ref 65–99)
Osmolality: 272 (ref 275–301)
Potassium: 3.5 mmol/L (ref 3.5–5.1)

## 2012-03-25 LAB — CULTURE, BLOOD (SINGLE)

## 2012-03-27 LAB — STOOL CULTURE

## 2012-04-13 ENCOUNTER — Encounter: Payer: Self-pay | Admitting: Family Medicine

## 2012-06-01 ENCOUNTER — Ambulatory Visit: Payer: Self-pay | Admitting: Orthopedic Surgery

## 2012-06-14 ENCOUNTER — Encounter: Payer: Self-pay | Admitting: Family Medicine

## 2012-06-14 ENCOUNTER — Ambulatory Visit (INDEPENDENT_AMBULATORY_CARE_PROVIDER_SITE_OTHER): Payer: BC Managed Care – PPO | Admitting: Family Medicine

## 2012-06-14 VITALS — BP 120/72 | HR 77 | Temp 98.4°F | Resp 16 | Ht 62.0 in | Wt 189.4 lb

## 2012-06-14 DIAGNOSIS — I878 Other specified disorders of veins: Secondary | ICD-10-CM

## 2012-06-14 DIAGNOSIS — M170 Bilateral primary osteoarthritis of knee: Secondary | ICD-10-CM

## 2012-06-14 DIAGNOSIS — I872 Venous insufficiency (chronic) (peripheral): Secondary | ICD-10-CM

## 2012-06-14 DIAGNOSIS — J449 Chronic obstructive pulmonary disease, unspecified: Secondary | ICD-10-CM

## 2012-06-14 DIAGNOSIS — E7800 Pure hypercholesterolemia, unspecified: Secondary | ICD-10-CM

## 2012-06-14 DIAGNOSIS — L981 Factitial dermatitis: Secondary | ICD-10-CM

## 2012-06-14 DIAGNOSIS — R7309 Other abnormal glucose: Secondary | ICD-10-CM

## 2012-06-14 LAB — CK: Total CK: 43 U/L (ref 7–177)

## 2012-06-14 LAB — CBC WITH DIFFERENTIAL/PLATELET
Basophils Absolute: 0 10*3/uL (ref 0.0–0.1)
Basophils Relative: 0 % (ref 0–1)
Eosinophils Relative: 1 % (ref 0–5)
HCT: 39.9 % (ref 36.0–46.0)
MCHC: 33.1 g/dL (ref 30.0–36.0)
Monocytes Absolute: 0.5 10*3/uL (ref 0.1–1.0)
Neutro Abs: 5 10*3/uL (ref 1.7–7.7)
RDW: 14.8 % (ref 11.5–15.5)

## 2012-06-14 LAB — COMPREHENSIVE METABOLIC PANEL
ALT: 18 U/L (ref 0–35)
Alkaline Phosphatase: 93 U/L (ref 39–117)
Glucose, Bld: 99 mg/dL (ref 70–99)
Sodium: 137 mEq/L (ref 135–145)
Total Bilirubin: 0.5 mg/dL (ref 0.3–1.2)
Total Protein: 6.1 g/dL (ref 6.0–8.3)

## 2012-06-14 LAB — LIPID PANEL
LDL Cholesterol: 95 mg/dL (ref 0–99)
Total CHOL/HDL Ratio: 5.1 Ratio
Triglycerides: 224 mg/dL — ABNORMAL HIGH (ref <150)
VLDL: 45 mg/dL — ABNORMAL HIGH (ref 0–40)

## 2012-06-14 MED ORDER — ALBUTEROL SULFATE HFA 108 (90 BASE) MCG/ACT IN AERS: 2.0000 | INHALATION_SPRAY | Freq: Four times a day (QID) | RESPIRATORY_TRACT | Status: DC | PRN

## 2012-06-14 NOTE — Progress Notes (Signed)
[address]   [place], [place]  [address]   [phone]  Subjective:    Patient ID: Sonya [name], female    DOB: 03/26/1950, 62 y.o.   MRN: [id number]  HPI This 62 y.o. female presents for evaluation of   1.  OA knee: s/p L TKR 03/16/12; did really well; used walker in hospital; contracted fever so had to stay 4 days.  [facility] consulted; concerned about PNA.  Used walker to get into house and never used it again since.  Home PT evaluated and went to PT daily.  Husband provided care.  R knee is scheduled for Jun 29, 2012.   Severe pain; pain as severe as back surgery pain.  Very painful.  Morphine did not touch pain.  Nucynta rx two tablets 75mg .  Sitting on chair with IPAD.   Unable to garden again on knees.  Has returned to gardening; trying to clean up flower bed in front.  Has not returned to walking yet.  Has not walked for exercise for past year.  Foot fracture.   Dreading next surgery.  2.  COPD: stable; followed by Meredeth Ide every six months; seeing this week.  Ventolin; needs rx; uses twice per week.  No other rx; has oxygen at nighttime; has nebulizer but has not needed it.    3.  Rash:  Diffuse scars on forearms; sees dermatologist this week; much better.  Legs are good.  Has new lesions on B anterior shins.    4.  Venous stasis:  Stable; not too bad for decreased activity; did wear compression stockings.  Staples removed at 2 weeks; one month later, able to d/c stockings.  If on feet swelling.  Taking one Lasix daily; will take second pill sometimes.    5.  Depression/anxiety: seeing [facility] every three months; stable.  Really wants to go to [place] in 09/2012.    6. Hyperlipidemia:  Stable; no changes to management made at last visit; reports compliance with medication; good tolerance to medication; good symptom control.    Review of Systems  Constitutional: Negative for fever, chills and fatigue.  Respiratory: Positive for shortness of breath and wheezing. Negative for cough  and stridor.   Cardiovascular: Positive for leg swelling. Negative for chest pain and palpitations.  Musculoskeletal: Positive for myalgias, back pain, joint swelling and arthralgias.  Skin: Positive for color change, rash and wound.  Neurological: Negative for dizziness, tremors, seizures, syncope, facial asymmetry, speech difficulty, weakness, light-headedness, numbness and headaches.  Psychiatric/Behavioral: Positive for dysphoric mood. Negative for suicidal ideas, sleep disturbance and self-injury. The patient is nervous/anxious.    Past Medical History  Diagnosis Date  . HLD (hyperlipidemia)   . Emphysema   . Anxiety   . Arthritis   . GERD (gastroesophageal reflux disease)   . Anemia   . Hiatal hernia   . UTI (urinary tract infection)     frequent  . Unspecified hypothyroidism   . Hyperglycemia   . Neurotic excoriations   . Squamous cell carcinoma   . Depressive disorder, not elsewhere classified   . Bruising   . Benign neoplasm of colon   . Degeneration of lumbar or lumbosacral intervertebral disc   . Personal history of urinary calculi   . Scoliosis (and kyphoscoliosis), idiopathic   . Tobacco use disorder   . Osteoporosis, unspecified   . Insomnia, unspecified   . Edema   . Pure hypercholesterolemia   . Chronic pain syndrome   . Lumbosacral spondylosis without myelopathy   .  Allergic rhinitis, cause unspecified   . Hematuria, unspecified   . Unspecified vitamin D deficiency   . Prurigo nodularis   . Pneumonia    Past Surgical History  Procedure Laterality Date  . Vaginal hysterectomy  1990  . 2 harrington rods in back  1980  . Tonsillectomy and adenoidectomy  1964  . Cervical spine surgery  1980    [place]  . Lumbar spine surgery  1980    [place]  . Squamous cell carcinoma resection  02/2010    [facility]  . Bunionectomy  2006    both feet  . Joint replacement  03/16/12    L TKR   Current Outpatient Prescriptions on File Prior to Visit  Medication  Sig Dispense Refill  . albuterol (PROVENTIL HFA;VENTOLIN HFA) 108 (90 BASE) MCG/ACT inhaler Inhale 2 puffs into the lungs every 6 (six) hours as needed.      Marland Kitchen albuterol (PROVENTIL) (2.5 MG/3ML) 0.083% nebulizer solution Take 2.5 mg by nebulization every 6 (six) hours as needed.      Marland Kitchen atorvastatin (LIPITOR) 40 MG tablet Take 1 tablet (40 mg total) by mouth at bedtime.  90 tablet  3  . clonazePAM (KLONOPIN) 1 MG tablet Take 1 mg by mouth 2 (two) times daily as needed.       . docusate sodium (COLACE) 100 MG capsule Take 100 mg by mouth daily.        . DULoxetine (CYMBALTA) 60 MG capsule Take 30 mg by mouth daily. 3 tabs qd      . esomeprazole (NEXIUM) 40 MG capsule Take 1 capsule (40 mg total) by mouth daily.  90 capsule  3  . fexofenadine (ALLEGRA) 180 MG tablet Take 180 mg by mouth daily.      . fluticasone (FLONASE) 50 MCG/ACT nasal spray Place 2 sprays into the nose daily.      . furosemide (LASIX) 40 MG tablet Take 1 tablet (40 mg total) by mouth daily.  90 tablet  3  . HYDROcodone-acetaminophen (NORCO) 10-325 MG per tablet Take 1 tablet by mouth every 6 (six) hours as needed.      Marland Kitchen levothyroxine (SYNTHROID, LEVOTHROID) 88 MCG tablet Take 1 tablet (88 mcg total) by mouth daily.  90 tablet  3  . meloxicam (MOBIC) 15 MG tablet Take 15 mg by mouth daily.      . OXYGEN-HELIUM IN Inhale 2 L into the lungs at bedtime.      . potassium chloride SA (K-DUR,KLOR-CON) 20 MEQ tablet Take 1 tablet (20 mEq total) by mouth daily.  90 tablet  3  . traMADol (ULTRAM) 50 MG tablet Take 100 mg by mouth 4 (four) times daily.        Marland Kitchen zolpidem (AMBIEN) 10 MG tablet Take 10 mg by mouth at bedtime as needed.      . Calcium Carbonate-Vitamin D (CALTRATE 600+D) 600-400 MG-UNIT per chew tablet Chew 1 tablet by mouth daily.        . cyclobenzaprine (AMRIX) 15 MG 24 hr capsule Take 15 mg by mouth daily.        Marland Kitchen doxycycline (VIBRAMYCIN) 100 MG capsule Take 100 mg by mouth 2 (two) times daily.      Marland Kitchen ibandronate  (BONIVA) 150 MG tablet Take 150 mg by mouth every 30 (thirty) days. Take in the morning with a full glass of water, on an empty stomach, and do not take anything else by mouth or lie down for the next 30 min.       Marland Kitchen  nitroGLYCERIN (NITROSTAT) 0.4 MG SL tablet Place 0.4 mg under the tongue every 5 (five) minutes as needed.        Marland Kitchen oxycodone (OXYCONTIN) 30 MG TB12 Take 30 mg by mouth 3 (three) times daily.        . pregabalin (LYRICA) 75 MG capsule Take 75 mg by mouth at bedtime.        . rosuvastatin (CRESTOR) 40 MG tablet Take 40 mg by mouth at bedtime.        . triamcinolone cream (KENALOG) 0.1 % Apply topically 2 (two) times daily.       No current facility-administered medications on file prior to visit.       Objective:   Physical Exam  Nursing note and vitals reviewed. Constitutional: She is oriented to person, place, and time. She appears well-developed and well-nourished. No distress.  HENT:  Head: Normocephalic and atraumatic.  Mouth/Throat: Oropharynx is clear and moist.  Eyes: Conjunctivae and EOM are normal. Pupils are equal, round, and reactive to light.  Neck: Normal range of motion. Neck supple. No JVD present. No thyromegaly present.  Cardiovascular: Normal rate, regular rhythm, normal heart sounds and intact distal pulses.  Exam reveals no gallop and no friction rub.   No murmur heard. Pulmonary/Chest: Effort normal and breath sounds normal. She has no wheezes. She has no rales.  Lymphadenopathy:    She has no cervical adenopathy.  Neurological: She is alert and oriented to person, place, and time. No cranial nerve deficit. She exhibits normal muscle tone. Coordination normal.  Skin: She is not diaphoretic.  Well healed scars B forearms; scattered healing lesions B anterior legs.  Psychiatric: She has a normal mood and affect. Her behavior is normal. Judgment and thought content normal.       Assessment & Plan:  Pure hypercholesterolemia - Plan: CBC with  Differential, CK, Lipid panel  Other abnormal glucose - Plan: CBC with Differential, Comprehensive metabolic panel, Hemoglobin A1c  Venous stasis   1. Hypercholesterolemia:  Controlled; obtain labs; continue current medications. 2.  Glucose intolerance; stable; obtain labs; continue with dietary modifications. 3.  Venous Stasis: stable; continue Lasix; obtain labs. 4.  OA knees B:  Improving; s/p L TKR; scheduled for R TKR in upcoming month.   5.  COPD; stable; followed by Meredeth Ide; no recent issues. 6. Anxiety with depression: worsening with recent surgery and decreased independence yet coping well. 7. Neurotic Excoriations; improved since last visit; appointment this week with dermatology.  Meds ordered this encounter  Medications  . albuterol (PROVENTIL HFA;VENTOLIN HFA) 108 (90 BASE) MCG/ACT inhaler    Sig: Inhale 2 puffs into the lungs every 6 (six) hours as needed for wheezing (cough, shortness of breath or wheezing.).    Dispense:  3 Inhaler    Refill:  3

## 2012-06-22 ENCOUNTER — Ambulatory Visit: Payer: Self-pay | Admitting: Orthopedic Surgery

## 2012-06-22 LAB — BASIC METABOLIC PANEL
Anion Gap: 8 (ref 7–16)
BUN: 7 mg/dL (ref 7–18)
Calcium, Total: 8.9 mg/dL (ref 8.5–10.1)
Chloride: 103 mmol/L (ref 98–107)
Co2: 27 mmol/L (ref 21–32)
Creatinine: 0.67 mg/dL (ref 0.60–1.30)
Glucose: 111 mg/dL — ABNORMAL HIGH (ref 65–99)
Potassium: 3 mmol/L — ABNORMAL LOW (ref 3.5–5.1)
Sodium: 138 mmol/L (ref 136–145)

## 2012-06-22 LAB — CBC
MCH: 28.8 pg (ref 26.0–34.0)
MCV: 86 fL (ref 80–100)
Platelet: 236 10*3/uL (ref 150–440)

## 2012-06-22 LAB — APTT: Activated PTT: 31.5 secs (ref 23.6–35.9)

## 2012-06-22 LAB — MRSA PCR SCREENING

## 2012-06-28 ENCOUNTER — Encounter: Payer: Self-pay | Admitting: Family Medicine

## 2012-07-01 ENCOUNTER — Inpatient Hospital Stay: Payer: Self-pay | Admitting: Orthopedic Surgery

## 2012-07-02 LAB — HEMOGLOBIN: HGB: 11.1 g/dL — ABNORMAL LOW (ref 12.0–16.0)

## 2012-07-02 LAB — PLATELET COUNT: Platelet: 203 10*3/uL (ref 150–440)

## 2012-07-02 LAB — BASIC METABOLIC PANEL
Anion Gap: 6 — ABNORMAL LOW (ref 7–16)
BUN: 5 mg/dL — ABNORMAL LOW (ref 7–18)
Chloride: 106 mmol/L (ref 98–107)
Co2: 26 mmol/L (ref 21–32)
Osmolality: 274 (ref 275–301)

## 2012-12-20 ENCOUNTER — Ambulatory Visit (INDEPENDENT_AMBULATORY_CARE_PROVIDER_SITE_OTHER): Payer: BC Managed Care – PPO | Admitting: Family Medicine

## 2012-12-20 ENCOUNTER — Encounter: Payer: Self-pay | Admitting: Family Medicine

## 2012-12-20 VITALS — BP 120/82 | HR 76 | Temp 98.3°F | Resp 16 | Ht 62.0 in | Wt 191.4 lb

## 2012-12-20 DIAGNOSIS — Z0000 Encounter for general adult medical examination without abnormal findings: Secondary | ICD-10-CM

## 2012-12-20 DIAGNOSIS — Z01419 Encounter for gynecological examination (general) (routine) without abnormal findings: Secondary | ICD-10-CM

## 2012-12-20 DIAGNOSIS — E7800 Pure hypercholesterolemia, unspecified: Secondary | ICD-10-CM

## 2012-12-20 DIAGNOSIS — Z23 Encounter for immunization: Secondary | ICD-10-CM

## 2012-12-20 LAB — POCT URINALYSIS DIPSTICK
Bilirubin, UA: NEGATIVE
Glucose, UA: NEGATIVE
Leukocytes, UA: NEGATIVE
Nitrite, UA: NEGATIVE
Protein, UA: NEGATIVE
Urobilinogen, UA: 0.2
pH, UA: 6

## 2012-12-20 LAB — LIPID PANEL
HDL: 28 mg/dL — ABNORMAL LOW (ref >39)
Total CHOL/HDL Ratio: 9 Ratio
Triglycerides: 858 mg/dL — ABNORMAL HIGH (ref <150)

## 2012-12-20 LAB — CBC WITH DIFFERENTIAL/PLATELET
Basophils Absolute: 0 10*3/uL (ref 0.0–0.1)
Basophils Relative: 0 % (ref 0–1)
Eosinophils Absolute: 0.2 10*3/uL (ref 0.0–0.7)
Eosinophils Relative: 3 % (ref 0–5)
Lymphocytes Relative: 42 % (ref 12–46)
MCHC: 32.8 g/dL (ref 30.0–36.0)
MCV: 84.7 fL (ref 78.0–100.0)
Platelets: 241 10*3/uL (ref 150–400)
RDW: 15.2 % (ref 11.5–15.5)
WBC: 4.5 10*3/uL (ref 4.0–10.5)

## 2012-12-20 LAB — COMPREHENSIVE METABOLIC PANEL
AST: 26 U/L (ref 0–37)
Alkaline Phosphatase: 107 U/L (ref 39–117)
BUN: 15 mg/dL (ref 6–23)
Creat: 0.89 mg/dL (ref 0.50–1.10)
Glucose, Bld: 85 mg/dL (ref 70–99)
Total Bilirubin: 0.3 mg/dL (ref 0.3–1.2)

## 2012-12-20 LAB — HEMOGLOBIN A1C
Hgb A1c MFr Bld: 5.9 % — ABNORMAL HIGH (ref <5.7)
Mean Plasma Glucose: 123 mg/dL — ABNORMAL HIGH (ref <117)

## 2012-12-20 LAB — TSH: TSH: 1.858 u[IU]/mL (ref 0.350–4.500)

## 2012-12-20 MED ORDER — FEXOFENADINE HCL 180 MG PO TABS: 180.0000 mg | ORAL_TABLET | Freq: Every day | ORAL | Status: DC

## 2012-12-20 NOTE — Progress Notes (Signed)
[address]   [place], [place]  [address]   [phone]  Subjective:    Patient ID: Sonya [name], female    DOB: [birth year], 62 y.o.   MRN: [id number]  HPI This 62 y.o. female presents for Complete Physical Exam. Patient presents today for CPE Last CPE- 6/13 PAP- 2012 (total hysterectomy) WNL. Endometriosis; ovaries removed.  No cervical or uterine cancer. Mammo-6/13 [facility]) Dexa- 6/13 Colonoscopy- 2010, due 2015 for repeat. Tdap- 2012 Pneumovax 2011 Flu- today Shingles vaccine: never; recommended Eye exam- will go 1/15, wears reading glasses Dentist- usually every 6 months, missed this year due to knee surgery/travel  03/16/12-left knee replacement 07/01/12- right knee replacement  Still having a lot of skin issues- lesions that aren't healing. Has blood blisters that pop, then skin is fragile. Been seen by two dermatologists. Dermatologist says from swelling. Last saw Dr. Cheree Ditto 9/14. Has been taking zinc supplements and ester c- seeing some improvement. Has tried several other creams without improvement.  Had URI with sore throat several weeks ago. Negative strep. Was wheezing. Referred from [facility]- treated with zpack and prednisone.  Saw Dr. Mayo Ao last week who ordered overnight O2 level testing. Not able to come off of home O2. Sats in low 80s.   Recently saw Dr. Council Mechanic. No change in meds. Started walking 3x week with a neighbor.  Psychiatrist has recently suggested she see a therapist. Patient doesn't want to see another doctor.   Stopped Lipitor, started fish oil. Interested in see lipid levels. Feels better off Lipitor. Did better on Crestor, but insurance wouldn't pay.  Review of Systems  Constitutional: Negative for fever, chills, diaphoresis and fatigue.  HENT: Positive for hearing loss. Negative for ear pain, facial swelling, mouth sores, nosebleeds, postnasal drip, rhinorrhea, sinus pressure, sneezing, sore throat,  tinnitus, trouble swallowing and voice change.   Eyes: Negative for photophobia and visual disturbance.  Respiratory: Negative for cough, shortness of breath, wheezing and stridor.   Cardiovascular: Positive for palpitations and leg swelling. Negative for chest pain.  Gastrointestinal: Positive for constipation. Negative for nausea, vomiting, abdominal pain, diarrhea, blood in stool, abdominal distention, anal bleeding and rectal pain.  Endocrine: Negative for cold intolerance, heat intolerance, polydipsia, polyphagia and polyuria.  Genitourinary: Negative for dysuria, urgency, frequency, hematuria, vaginal bleeding, vaginal discharge, genital sores, vaginal pain and pelvic pain.  Musculoskeletal: Positive for arthralgias, back pain, joint swelling, myalgias, neck pain and neck stiffness. Negative for gait problem.  Skin: Positive for color change, rash and wound. Negative for pallor.  Neurological: Negative for dizziness, tremors, seizures, syncope, facial asymmetry, speech difficulty, weakness, light-headedness, numbness and headaches.  Hematological: Negative for adenopathy. Does not bruise/bleed easily.  Psychiatric/Behavioral: Positive for sleep disturbance and dysphoric mood. The patient is nervous/anxious.    Left ear decreased hearing (chronic), no ringing in ears, no dizziness, no headache, no mouth sores, no neck swelling, no CP, no cough. Neck pain same. Takes stool softener every day and Miralax if no BM in 2-3 days. Nocturia- 3x, unchanged. Abdominal pain unchanged. Doesn't sleep well. Didn't sleep well last night.    Past Medical History  Diagnosis Date  . HLD (hyperlipidemia)   . Emphysema   . Anxiety   . Arthritis   . GERD (gastroesophageal reflux disease)   . Anemia   . Hiatal hernia   . UTI (urinary tract infection)     frequent  . Unspecified hypothyroidism   . Hyperglycemia   . Neurotic excoriations   .  Squamous cell carcinoma   . Depressive disorder, not  elsewhere classified   . Bruising   . Benign neoplasm of colon   . Degeneration of lumbar or lumbosacral intervertebral disc   . Personal history of urinary calculi   . Scoliosis (and kyphoscoliosis), idiopathic   . Tobacco use disorder   . Osteoporosis, unspecified   . Insomnia, unspecified   . Edema   . Pure hypercholesterolemia   . Chronic pain syndrome   . Lumbosacral spondylosis without myelopathy   . Allergic rhinitis, cause unspecified   . Hematuria, unspecified   . Unspecified vitamin D deficiency   . Prurigo nodularis   . Pneumonia    Past Surgical History  Procedure Laterality Date  . Vaginal hysterectomy  1990  . 2 harrington rods in back  1980  . Tonsillectomy and adenoidectomy  1964  . [place]ervical spine surgery  1980    [place]  . Lumbar spine surgery  1980    [place]  . Squamous cell carcinoma resection  02/2010    [facility]  . Bunionectomy  2006    both feet  . Abdominal hysterectomy      endometriosis; ovaries resected; no cancer.  . Joint replacement  03/16/12    L TKR; R TKR   Allergies  Allergen Reactions  . Levofloxacin   . Sulfonamide Derivatives    Current Outpatient Prescriptions on File Prior to Visit  Medication Sig Dispense Refill  . albuterol (PROVENTIL HFA;VENTOLIN HFA) 108 (90 BASE) MCG/ACT inhaler Inhale 2 puffs into the lungs every 6 (six) hours as needed.      Marland Kitchen albuterol (PROVENTIL HFA;VENTOLIN HFA) 108 (90 BASE) MCG/ACT inhaler Inhale 2 puffs into the lungs every 6 (six) hours as needed for wheezing (cough, shortness of breath or wheezing.).  3 Inhaler  3  . albuterol (PROVENTIL) (2.5 MG/3ML) 0.083% nebulizer solution Take 2.5 mg by nebulization every 6 (six) hours as needed.      . clonazePAM (KLONOPIN) 1 MG tablet Take 1 mg by mouth 2 (two) times daily as needed.       . docusate sodium (COLACE) 100 MG capsule Take 100 mg by mouth daily.        . DULoxetine (CYMBALTA) 60 MG capsule Take 30 mg by mouth daily. 3 tabs qd      .  esomeprazole (NEXIUM) 40 MG capsule Take 1 capsule (40 mg total) by mouth daily.  90 capsule  3  . fluticasone (FLONASE) 50 MCG/ACT nasal spray Place 2 sprays into the nose daily.      . furosemide (LASIX) 40 MG tablet Take 1 tablet (40 mg total) by mouth daily.  90 tablet  3  . HYDROcodone-acetaminophen (NORCO) 10-325 MG per tablet Take 1 tablet by mouth every 6 (six) hours as needed.      Marland Kitchen levothyroxine (SYNTHROID, LEVOTHROID) 88 MCG tablet Take 1 tablet (88 mcg total) by mouth daily.  90 tablet  3  . meloxicam (MOBIC) 15 MG tablet Take 15 mg by mouth daily.      . nitroGLYCERIN (NITROSTAT) 0.4 MG SL tablet Place 0.4 mg under the tongue every 5 (five) minutes as needed.        . OXYGEN-HELIUM IN Inhale 2 L into the lungs at bedtime.      . potassium chloride SA (K-DUR,KLOR-CON) 20 MEQ tablet Take 1 tablet (20 mEq total) by mouth daily.  90 tablet  3  . traMADol (ULTRAM) 50 MG tablet Take 100 mg by mouth 4 (four)  times daily.        Marland Kitchen zolpidem (AMBIEN) 10 MG tablet Take 10 mg by mouth at bedtime as needed.      Marland Kitchen atorvastatin (LIPITOR) 40 MG tablet Take 1 tablet (40 mg total) by mouth at bedtime.  90 tablet  3  . Calcium Carbonate-Vitamin D (CALTRATE 600+D) 600-400 MG-UNIT per chew tablet Chew 1 tablet by mouth daily.        . cyclobenzaprine (AMRIX) 15 MG 24 hr capsule Take 15 mg by mouth daily.        Marland Kitchen ibandronate (BONIVA) 150 MG tablet Take 150 mg by mouth every 30 (thirty) days. Take in the morning with a full glass of water, on an empty stomach, and do not take anything else by mouth or lie down for the next 30 min.       Marland Kitchen oxycodone (OXYCONTIN) 30 MG TB12 Take 30 mg by mouth 3 (three) times daily.        . pregabalin (LYRICA) 75 MG capsule Take 75 mg by mouth at bedtime.        . rosuvastatin (CRESTOR) 40 MG tablet Take 40 mg by mouth at bedtime.        . triamcinolone cream (KENALOG) 0.1 % Apply topically 2 (two) times daily.       No current facility-administered medications on file  prior to visit.   History   Social History  . Marital Status: Married    Spouse Name: N/A    Number of Children: 1  . Years of Education: 12   Occupational History  . disabled     2   DDD   Social History Main Topics  . Smoking status: Former Smoker -- 1.00 packs/day for 25 years    Types: Cigarettes  . Smokeless tobacco: Not on file     Comment: quit 11/07  . Alcohol Use: No  . Drug Use: Not on file  . Sexual Activity: Yes   Other Topics Concern  . Not on file   Social History Narrative   Married, retired, gets regular exercise - recently not able to. Lives with spouse;married 1992 (second marriage) happily married; no abuse. 1 daughter, 3 stepchildren, 8 grandchildren, 1 gg. Caffeine use cosumes a moderate amount 1 serving per day. Always uses seat belts. No guns in home. Smoke alarm and carbon monoxide detectors in the home. No Living Will.   Family History  Problem Relation Age of Onset  . Coronary artery disease Brother     stents    SIster stents also  . Heart failure Brother     CABG  AMI @ 36  . Heart failure Mother     pacemaker  . Heart failure Father     AMI  . Cancer Father     colon  . GI problems      Family history malignancy, GI tract  . Cancer Maternal Grandmother     Objective:   Physical Exam  Nursing note and vitals reviewed. Constitutional: She is oriented to person, place, and time. She appears well-developed and well-nourished. No distress.  HENT:  Head: Normocephalic and atraumatic.  Right Ear: Tympanic membrane, external ear and ear canal normal.  Left Ear: Tympanic membrane, external ear and ear canal normal.  Nose: Nose normal.  Mouth/Throat: Oropharynx is clear and moist.  Eyes: Conjunctivae and EOM are normal. Pupils are equal, round, and reactive to light. Right eye exhibits no discharge. Left eye exhibits no discharge.  Neck: Normal  range of motion. Neck supple. No JVD present. No thyromegaly present.  Cardiovascular: Normal  rate, regular rhythm, normal heart sounds and intact distal pulses.  Exam reveals no gallop and no friction rub.   No murmur heard. Pulmonary/Chest: Effort normal and breath sounds normal. No respiratory distress. She has no wheezes. She has no rales. She exhibits no tenderness.  Abdominal: Soft. Bowel sounds are normal.  Genitourinary: Vagina normal. No breast swelling, tenderness, discharge or bleeding. There is no rash, tenderness or lesion on the right labia. There is no rash, tenderness or lesion on the left labia. No vaginal discharge found.  Musculoskeletal: Normal range of motion. She exhibits no edema and no tenderness.  Lymphadenopathy:    She has no cervical adenopathy.  Neurological: She is alert and oriented to person, place, and time. She has normal reflexes.  Skin: Skin is warm and dry. Rash noted. She is not diaphoretic.  Scars B forearms; multiple abrasions/excoriations forearms and lower legs B.  Psychiatric: She has a normal mood and affect. Her behavior is normal. Judgment and thought content normal.      Results for orders placed in visit on 12/20/12  POCT URINALYSIS DIPSTICK      Result Value Range   Color, UA yellow     Clarity, UA clear     Glucose, UA neg     Bilirubin, UA neg     Ketones, UA neg     Spec Grav, UA 1.020     Blood, UA neg     pH, UA 6.0     Protein, UA neg     Urobilinogen, UA 0.2     Nitrite, UA neg     Leukocytes, UA Negative     EKG: NSR Assessment & Plan:  Routine general medical examination at a health care facility - Plan: CBC with Differential, CK, Comprehensive metabolic panel, Lipid panel, TSH, Vitamin B12, Vit D  25 hydroxy (rtn osteoporosis monitoring), Hemoglobin A1c, POCT urinalysis dipstick, EKG 12-Lead  Routine gynecological examination - Plan: MM Digital Screening  Pure hypercholesterolemia - Plan: Lipid panel  Need for prophylactic vaccination and inoculation against influenza - Plan: Flu Vaccine QUAD 36+ mos IM   1.   CPE: anticipatory guidance --- weight loss, exercise.  Pap smear UTD in 2012 and no longer warranted.  Refer for mammogram at [facility]. Bone Density scan UTD.  Colonoscopy UTD.  Immunizations UTD; to check with insurance regarding Zostavax coverage.  S/p flu vaccine.  Obtain labs.  2.  Gynecological exam: completed; refer for mammogram; Pap smear UTD 2012. 3.  S/p flu vaccine 4. Hypercholesterolemia: uncontrolled due to non-compliance with Lipitor.  Obtain labs.  5.  COPD: stable; followed by Meredeth Ide. 6.  Anxiety with depression: stable; followed by psychiatry who has recommended psychotherapy. 7.  Chronic Lower back pain: stable.   Meds ordered this encounter  Medications  . fexofenadine (ALLEGRA) 180 MG tablet    Sig: Take 1 tablet (180 mg total) by mouth daily.    Dispense:  90 tablet    Refill:  3

## 2013-04-26 ENCOUNTER — Encounter: Payer: Self-pay | Admitting: Family Medicine

## 2013-04-26 ENCOUNTER — Ambulatory Visit (INDEPENDENT_AMBULATORY_CARE_PROVIDER_SITE_OTHER): Payer: BC Managed Care – PPO | Admitting: Family Medicine

## 2013-04-26 VITALS — BP 123/76 | HR 83 | Temp 98.1°F | Resp 16 | Ht 62.0 in

## 2013-04-26 DIAGNOSIS — R0609 Other forms of dyspnea: Secondary | ICD-10-CM

## 2013-04-26 DIAGNOSIS — J029 Acute pharyngitis, unspecified: Secondary | ICD-10-CM

## 2013-04-26 DIAGNOSIS — R0989 Other specified symptoms and signs involving the circulatory and respiratory systems: Secondary | ICD-10-CM

## 2013-04-26 DIAGNOSIS — E039 Hypothyroidism, unspecified: Secondary | ICD-10-CM

## 2013-04-26 DIAGNOSIS — E7800 Pure hypercholesterolemia, unspecified: Secondary | ICD-10-CM

## 2013-04-26 DIAGNOSIS — G4726 Circadian rhythm sleep disorder, shift work type: Secondary | ICD-10-CM

## 2013-04-26 DIAGNOSIS — J309 Allergic rhinitis, unspecified: Secondary | ICD-10-CM

## 2013-04-26 DIAGNOSIS — F341 Dysthymic disorder: Secondary | ICD-10-CM

## 2013-04-26 DIAGNOSIS — R5381 Other malaise: Secondary | ICD-10-CM

## 2013-04-26 DIAGNOSIS — R0789 Other chest pain: Secondary | ICD-10-CM

## 2013-04-26 DIAGNOSIS — R5383 Other fatigue: Secondary | ICD-10-CM

## 2013-04-26 DIAGNOSIS — R7309 Other abnormal glucose: Secondary | ICD-10-CM

## 2013-04-26 LAB — COMPLETE METABOLIC PANEL WITH GFR
ALT: 26 U/L (ref 0–35)
AST: 24 U/L (ref 0–37)
Albumin: 4.1 g/dL (ref 3.5–5.2)
Alkaline Phosphatase: 96 U/L (ref 39–117)
BUN: 14 mg/dL (ref 6–23)
CHLORIDE: 103 meq/L (ref 96–112)
CO2: 27 meq/L (ref 19–32)
CREATININE: 0.75 mg/dL (ref 0.50–1.10)
Calcium: 9.1 mg/dL (ref 8.4–10.5)
GFR, EST NON AFRICAN AMERICAN: 86 mL/min
GLUCOSE: 105 mg/dL — AB (ref 70–99)
Potassium: 4.4 mEq/L (ref 3.5–5.3)
Sodium: 138 mEq/L (ref 135–145)
Total Bilirubin: 0.5 mg/dL (ref 0.2–1.2)
Total Protein: 6.6 g/dL (ref 6.0–8.3)

## 2013-04-26 LAB — CBC WITH DIFFERENTIAL/PLATELET
Basophils Absolute: 0 10*3/uL (ref 0.0–0.1)
Basophils Relative: 0 % (ref 0–1)
EOS PCT: 2 % (ref 0–5)
Eosinophils Absolute: 0.1 10*3/uL (ref 0.0–0.7)
HEMATOCRIT: 41.7 % (ref 36.0–46.0)
Hemoglobin: 14 g/dL (ref 12.0–15.0)
LYMPHS ABS: 1.2 10*3/uL (ref 0.7–4.0)
LYMPHS PCT: 21 % (ref 12–46)
MCH: 29.5 pg (ref 26.0–34.0)
MCHC: 33.6 g/dL (ref 30.0–36.0)
MCV: 88 fL (ref 78.0–100.0)
MONO ABS: 0.5 10*3/uL (ref 0.1–1.0)
MONOS PCT: 8 % (ref 3–12)
Neutro Abs: 3.9 10*3/uL (ref 1.7–7.7)
Neutrophils Relative %: 69 % (ref 43–77)
Platelets: 266 10*3/uL (ref 150–400)
RBC: 4.74 MIL/uL (ref 3.87–5.11)
RDW: 14.2 % (ref 11.5–15.5)
WBC: 5.7 10*3/uL (ref 4.0–10.5)

## 2013-04-26 LAB — HEMOGLOBIN A1C
Hgb A1c MFr Bld: 5.1 % (ref <5.7)
Mean Plasma Glucose: 100 mg/dL (ref <117)

## 2013-04-26 LAB — LIPID PANEL
Cholesterol: 173 mg/dL (ref 0–200)
HDL: 47 mg/dL (ref >39)
LDL Cholesterol: 100 mg/dL — ABNORMAL HIGH (ref 0–99)
TRIGLYCERIDES: 130 mg/dL (ref <150)
Total CHOL/HDL Ratio: 3.7 Ratio
VLDL: 26 mg/dL (ref 0–40)

## 2013-04-26 LAB — TSH: TSH: 0.458 u[IU]/mL (ref 0.350–4.500)

## 2013-04-26 MED ORDER — LEVOTHYROXINE SODIUM 88 MCG PO TABS: 88.0000 ug | ORAL_TABLET | Freq: Every day | ORAL | Status: DC

## 2013-04-26 MED ORDER — FEXOFENADINE HCL 180 MG PO TABS: 180.0000 mg | ORAL_TABLET | Freq: Every day | ORAL | Status: DC

## 2013-04-26 MED ORDER — ROSUVASTATIN CALCIUM 20 MG PO TABS: 20.0000 mg | ORAL_TABLET | Freq: Every day | ORAL | Status: DC

## 2013-04-26 MED ORDER — POTASSIUM CHLORIDE CRYS ER 20 MEQ PO TBCR: 20.0000 meq | EXTENDED_RELEASE_TABLET | Freq: Every day | ORAL | Status: DC

## 2013-04-26 MED ORDER — ESOMEPRAZOLE MAGNESIUM 40 MG PO CPDR: 40.0000 mg | DELAYED_RELEASE_CAPSULE | Freq: Every day | ORAL | Status: DC

## 2013-04-26 MED ORDER — FUROSEMIDE 40 MG PO TABS: 40.0000 mg | ORAL_TABLET | Freq: Every day | ORAL | Status: DC

## 2013-04-26 MED ORDER — FLUTICASONE PROPIONATE 50 MCG/ACT NA SUSP: 2.0000 | Freq: Every day | NASAL | Status: DC

## 2013-04-26 NOTE — Progress Notes (Signed)
This chart was scribed for Wardell Honour, MD by Eston Mould, ED Scribe. This patient was seen in room Room/bed 25 and the patient's care was started at 10:34 AM. Subjective:    Patient ID: Sonya [name], female    DOB: Aug 28, 1950, 63 y.o.   MRN: [id number]  04/26/2013  Follow-up and Medication Refill  HPI Sonya [name] is a 63 y.o. female who presents to the [facility] for 6 month F/U appointment for high cholesterol, chronic venous stasis and hypothyroidism. At her physical in 2013, her Vit D level was low and cholesterol was extremely elevated. I recommend her increase her Vit D supplement by 800 units daily. I also recommend to restart Lipitor half tablet daily, 40 mg. I scheduled her for mammogram and I do no see the results.  Pt states she has not had a good past 6 months. She states she is still concerned and unsure why her triglycerides and cholesterol were elevated during her last lab work. Pt states she has had an increase in fatigue and sates she is no longer taking narcotics. She believes she stopped taking her narcotics December 2014/January 2015. She states she is still in pain but states she does not want to take her medications. Pt states her Tramadol was recently changed. Pt states she would like to be taken off of  Klonopin. She states she tried to discontinue the medication on her own and states she was going through strong withdrawals and states she feels her body was going to shut down on her. She suspects her increase in fatigue is due to her thyroid. Pt states she is frustrated and is unsure what to do next with her health. Pt states she has had a sleep study done that was borderline and states she is unsure when the study was done. Pt states she will be seeing "him" this month but is unsure of the specific date.  Pt states she has been feeling drained for a long duration of time but states she has noticed an increase in fatigue and feeling drained since December  2014. She denies having any recent changes. Pt states she still has intermittent pain and complications with bilateral knees. She states she "wishes she never had her surgery". Pt states she drinks apple with cinnamon in her water daily.   Pt states she can get very emotional very quickly. She states she believes due to the increase of fatigue, her emotions can change even quicker. Pt states she has been resting enough and reports getting adequate sleep. Pt denies waking up in her sleep due to pain or any other reason. Pt states she is being seen by all of her doctors in March 2015 due to being in [place] in April 2015.  Pt denies taking or increasing Vitamin D intake. She states "she forgot". Pt states she is taking One-a-Day Vitamin and EsterC. She denies having 3 servings a day. Pt states she takes stool softeners and Miralax PRN. Pt states she has been taking honey and other natural supplements. She states she has been trying natural supplement and remedies to avoid using medications. Pt states her sisters husband recently died unexpectedly in 01-Apr-2013 and she states she hates seeing her sisters sad. She states she took a trip to [facility] with her sister shortly after his death.  Pt states she feels she has noticed "a knot" in her throat that has caused her throat to be sore for the past month. She states she has been congested and  slight cough with an increase of sputum. She reports having an increase of sputum that is above normal for her. Pt states she feels "tight today" and believes this is due to the weather. She states her indigestion is normal as long as she is taking her Nexium. She states she has been using her Flonase. Pt denies taking Allegra. Pt denies fever, rhinorrhea, nausea, and emesis. Pt states she drinks plenty of fluids. She states she "was drinking an increase amount of Pepsi but states she has reduced it to about 1 a week".  Pt states she had her eye checked yesterday evening. She  states she is concerned for her skin sores she gets periodically that are now spreading.  Pt states she has had CP. She states prior to going to [facility] with her sister 1 year ago. She states she had a stabbing sensation to her back and states she was unable to breath. Pt states her family checked her BP and states she began to take oxygen. She states the pain lasted for about 15 mins. Pt states she had a short-lived moment of CP recently. She states she was informed by Cardiologist to F/U in 1 year.   Review of Systems  Constitutional: Positive for fatigue. Negative for fever and appetite change.  HENT: Positive for postnasal drip, sore throat and voice change. Negative for mouth sores and rhinorrhea.   Respiratory: Positive for cough.   Cardiovascular: Positive for chest pain.  Gastrointestinal: Negative for nausea, vomiting, diarrhea and constipation.  Musculoskeletal: Negative for back pain and neck pain.  Skin: Positive for color change.  Neurological: Negative for dizziness, weakness and numbness.  Psychiatric/Behavioral: Negative for sleep disturbance.    Past Medical History  Diagnosis Date  . HLD (hyperlipidemia)   . Emphysema   . Anxiety   . Arthritis   . GERD (gastroesophageal reflux disease)   . Anemia   . Hiatal hernia   . UTI (urinary tract infection)     frequent  . Unspecified hypothyroidism   . Hyperglycemia   . Neurotic excoriations   . Squamous cell carcinoma   . Depressive disorder, not elsewhere classified   . Bruising   . Benign neoplasm of colon   . Degeneration of lumbar or lumbosacral intervertebral disc   . Personal history of urinary calculi   . Scoliosis (and kyphoscoliosis), idiopathic   . Tobacco use disorder   . Osteoporosis, unspecified   . Insomnia, unspecified   . Edema   . Pure hypercholesterolemia   . Chronic pain syndrome   . Lumbosacral spondylosis without myelopathy   . Allergic rhinitis, cause unspecified   . Hematuria, unspecified   .  Unspecified vitamin D deficiency   . Prurigo nodularis   . Pneumonia    Allergies  Allergen Reactions  . Levofloxacin   . Sulfonamide Derivatives    Current Outpatient Prescriptions  Medication Sig Dispense Refill  . albuterol (PROVENTIL HFA;VENTOLIN HFA) 108 (90 BASE) MCG/ACT inhaler Inhale 2 puffs into the lungs every 6 (six) hours as needed.    Marland Kitchen albuterol (PROVENTIL HFA;VENTOLIN HFA) 108 (90 BASE) MCG/ACT inhaler Inhale 2 puffs into the lungs every 6 (six) hours as needed for wheezing (cough, shortness of breath or wheezing.). 3 Inhaler 3  . clonazePAM (KLONOPIN) 1 MG tablet Take 1 mg by mouth 2 (two) times daily as needed.     . docusate sodium (COLACE) 100 MG capsule Take 100 mg by mouth daily.      . DULoxetine (CYMBALTA) 60  MG capsule Take 30 mg by mouth daily. 3 tabs qd    . esomeprazole (NEXIUM) 40 MG capsule Take 1 capsule (40 mg total) by mouth daily. 90 capsule 3  . fexofenadine (ALLEGRA) 180 MG tablet Take 1 tablet (180 mg total) by mouth daily. 90 tablet 3  . fluticasone (FLONASE) 50 MCG/ACT nasal spray Place 2 sprays into both nostrils daily. 16 g 11  . furosemide (LASIX) 40 MG tablet Take 1 tablet (40 mg total) by mouth daily. 90 tablet 3  . levothyroxine (SYNTHROID, LEVOTHROID) 88 MCG tablet Take 1 tablet (88 mcg total) by mouth daily. 90 tablet 3  . meloxicam (MOBIC) 15 MG tablet Take 15 mg by mouth daily.    . OXYGEN-HELIUM IN Inhale 2 L into the lungs at bedtime.    . potassium chloride SA (K-DUR,KLOR-CON) 20 MEQ tablet Take 1 tablet (20 mEq total) by mouth daily. 90 tablet 3  . traMADol (ULTRAM) 50 MG tablet Take 100 mg by mouth 4 (four) times daily.      . celecoxib (CELEBREX) 200 MG capsule Take 200 mg by mouth daily.    . nitroGLYCERIN (NITROSTAT) 0.4 MG SL tablet Place 1 tablet (0.4 mg total) under the tongue every 5 (five) minutes as needed. 25 tablet 6  . ranitidine (ZANTAC) 150 MG tablet Take 1 tablet (150 mg total) by mouth at bedtime. 30 tablet 2  .  rosuvastatin (CRESTOR) 20 MG tablet Take 1 tablet (20 mg total) by mouth daily. 90 tablet 3  . Tapentadol HCl 75 MG TABS 1 po qid prn    . zoster vaccine live, PF, (ZOSTAVAX) 40086 UNT/0.65ML injection Inject 19,400 Units into the skin once. 0.65 mL 0   No current facility-administered medications for this visit.       Objective:    BP 123/76 mmHg  Pulse 83  Temp(Src) 98.1 F (36.7 C)  Resp 16  Ht 5' 2"  (1.575 m)  Wt   SpO2 97%  Physical Exam  Nursing note and vitals reviewed. Constitutional: She is oriented to person, place, and time. She appears well-developed and well-nourished. No distress.  HENT:  Head: Normocephalic and atraumatic.  Right Ear: External ear normal.  Left Ear: External ear normal.  Drainage to back of throat and bilateral nares.  Eyes: Conjunctivae and EOM are normal. Pupils are equal, round, and reactive to light.  Neck: Normal range of motion. Neck supple. No tracheal deviation present. No thyromegaly present.  Cardiovascular: Normal rate, regular rhythm and normal heart sounds.  Exam reveals no gallop and no friction rub.   No murmur heard. Pulmonary/Chest: Effort normal and breath sounds normal. No respiratory distress. She has no wheezes. She has no rales. She exhibits no tenderness.  Lungs are clear.  Abdominal: Soft. Bowel sounds are normal. She exhibits no mass. There is no tenderness. There is no rebound and no guarding.  Musculoskeletal: Normal range of motion. She exhibits no tenderness.  Lymphadenopathy:    She has no cervical adenopathy.  Neurological: She is alert and oriented to person, place, and time. She has normal reflexes.  Skin: Skin is warm and dry. She is not diaphoretic.  Psychiatric: She has a normal mood and affect. Her behavior is normal. Judgment and thought content normal.   Results for orders placed or performed in visit on 04/26/13  CBC with Differential  Result Value Ref Range   WBC 5.7 4.0 - 10.5 K/uL   RBC 4.74 3.87 -  5.11 MIL/uL   Hemoglobin 14.0  12.0 - 15.0 g/dL   HCT 41.7 36.0 - 46.0 %   MCV 88.0 78.0 - 100.0 fL   MCH 29.5 26.0 - 34.0 pg   MCHC 33.6 30.0 - 36.0 g/dL   RDW 14.2 11.5 - 15.5 %   Platelets 266 150 - 400 K/uL   Neutrophils Relative % 69 43 - 77 %   Neutro Abs 3.9 1.7 - 7.7 K/uL   Lymphocytes Relative 21 12 - 46 %   Lymphs Abs 1.2 0.7 - 4.0 K/uL   Monocytes Relative 8 3 - 12 %   Monocytes Absolute 0.5 0.1 - 1.0 K/uL   Eosinophils Relative 2 0 - 5 %   Eosinophils Absolute 0.1 0.0 - 0.7 K/uL   Basophils Relative 0 0 - 1 %   Basophils Absolute 0.0 0.0 - 0.1 K/uL   Smear Review Criteria for review not met   COMPLETE METABOLIC PANEL WITH GFR  Result Value Ref Range   Sodium 138 135 - 145 mEq/L   Potassium 4.4 3.5 - 5.3 mEq/L   Chloride 103 96 - 112 mEq/L   CO2 27 19 - 32 mEq/L   Glucose, Bld 105 (H) 70 - 99 mg/dL   BUN 14 6 - 23 mg/dL   Creat 0.75 0.50 - 1.10 mg/dL   Total Bilirubin 0.5 0.2 - 1.2 mg/dL   Alkaline Phosphatase 96 39 - 117 U/L   AST 24 0 - 37 U/L   ALT 26 0 - 35 U/L   Total Protein 6.6 6.0 - 8.3 g/dL   Albumin 4.1 3.5 - 5.2 g/dL   Calcium 9.1 8.4 - 10.5 mg/dL   GFR, Est African American >89 mL/min   GFR, Est Non African American 86 mL/min  Lipid panel  Result Value Ref Range   Cholesterol 173 0 - 200 mg/dL   Triglycerides 130 <150 mg/dL   HDL 47 >39 mg/dL   Total CHOL/HDL Ratio 3.7 Ratio   VLDL 26 0 - 40 mg/dL   LDL Cholesterol 100 (H) 0 - 99 mg/dL  Hemoglobin A1c  Result Value Ref Range   Hgb A1c MFr Bld 5.1 <5.7 %   Mean Plasma Glucose 100 <117 mg/dL  TSH  Result Value Ref Range   TSH 0.458 0.350 - 4.500 uIU/mL       Assessment & Plan:  Hypothyroidism - Plan: CBC with Differential, COMPLETE METABOLIC PANEL WITH GFR, TSH  Pure hypercholesterolemia - Plan: CBC with Differential, COMPLETE METABOLIC PANEL WITH GFR, Lipid panel  Other abnormal glucose - Plan: Hemoglobin A1c  Other malaise and fatigue  Allergic rhinitis, cause unspecified  Sore  throat  Other chest pain   1. Hypothyroidism: controlled; obtain labs; refill of medication provided; no change in management. 2.  Hyperlipidemia: uncontrolled at last visit; repeat labs today; adjust medication as indicated. 3.  Glucose intolerance: stable; non-compliance with low sugar diet; obtain labs; recommend weight loss, exercise, low-carb and low-sugar food choices. 4.  Malaise and fatigue: New. Obtain labs; likely secondary to weaning off of chronic narcotics in past two months; now suffering with more pain which has affected patient emotionally and physically. 5.  Sore throat: new. Secondary to PND; recommend adding Allegra to Flonase.   6.  Venous stasis/leg swelling: stable; refill of Lasix provided. 7. Chest pain atypical: New since last visit; recommend follow-up with cardiology.  Pain free today; no exertional component.  If recurs before cardiology evaluation, present to ED. 8. Anxiety and depression: worsening due to worsening pain; recommend close  follow-up with psyciatry.    Meds ordered this encounter  Medications  . esomeprazole (NEXIUM) 40 MG capsule    Sig: Take 1 capsule (40 mg total) by mouth daily.    Dispense:  90 capsule    Refill:  3  . fexofenadine (ALLEGRA) 180 MG tablet    Sig: Take 1 tablet (180 mg total) by mouth daily.    Dispense:  90 tablet    Refill:  3  . fluticasone (FLONASE) 50 MCG/ACT nasal spray    Sig: Place 2 sprays into both nostrils daily.    Dispense:  16 g    Refill:  11    Disp: 3 month supply  . furosemide (LASIX) 40 MG tablet    Sig: Take 1 tablet (40 mg total) by mouth daily.    Dispense:  90 tablet    Refill:  3  . levothyroxine (SYNTHROID, LEVOTHROID) 88 MCG tablet    Sig: Take 1 tablet (88 mcg total) by mouth daily.    Dispense:  90 tablet    Refill:  3  . potassium chloride SA (K-DUR,KLOR-CON) 20 MEQ tablet    Sig: Take 1 tablet (20 mEq total) by mouth daily.    Dispense:  90 tablet    Refill:  3    Order Specific  Question:  Supervising Provider    Answer:  DOOLITTLE, ROBERT P [[id number]]  . rosuvastatin (CRESTOR) 20 MG tablet    Sig: Take 1 tablet (20 mg total) by mouth daily.    Dispense:  90 tablet    Refill:  3    Return in about 1 month (around 05/27/2013) for recheck FATIGUE, SORE THROAT.   I personally performed the services described in this documentation, which was scribed in my presence.  The recorded information has been reviewed and is accurate.  Reginia Forts, M.D.  [facility] [address] [place], [place]  [address] [phone] phone [phone] fax

## 2013-04-26 NOTE — Patient Instructions (Addendum)
1.  START VITAMIN D 1000 UNITS DAILY. 2.  RECOMMEND 3 SERVINGS OF DAIRY DAILY OR CALCIUM 600MG  ONE TABLET TWICE DAILY. 3.  CALL [facility] FOR A MAMMOGRAM. 4.  CALL DR. GOLLAN OF [facility] FOR APPOINTMENT.

## 2013-04-27 ENCOUNTER — Encounter: Payer: Self-pay | Admitting: Family Medicine

## 2013-04-29 ENCOUNTER — Encounter: Payer: Self-pay | Admitting: Family Medicine

## 2013-05-10 ENCOUNTER — Ambulatory Visit (INDEPENDENT_AMBULATORY_CARE_PROVIDER_SITE_OTHER): Payer: BC Managed Care – PPO | Admitting: Cardiovascular Disease

## 2013-05-10 ENCOUNTER — Encounter: Payer: Self-pay | Admitting: Cardiovascular Disease

## 2013-05-10 VITALS — BP 110/78 | HR 80 | Ht 62.0 in | Wt 189.8 lb

## 2013-05-10 DIAGNOSIS — J449 Chronic obstructive pulmonary disease, unspecified: Secondary | ICD-10-CM

## 2013-05-10 DIAGNOSIS — R0602 Shortness of breath: Secondary | ICD-10-CM

## 2013-05-10 DIAGNOSIS — J4489 Other specified chronic obstructive pulmonary disease: Secondary | ICD-10-CM

## 2013-05-10 DIAGNOSIS — R079 Chest pain, unspecified: Secondary | ICD-10-CM

## 2013-05-10 DIAGNOSIS — E785 Hyperlipidemia, unspecified: Secondary | ICD-10-CM

## 2013-05-10 MED ORDER — NITROGLYCERIN 0.4 MG SL SUBL: 0.4000 mg | SUBLINGUAL_TABLET | SUBLINGUAL | Status: DC | PRN

## 2013-05-10 NOTE — Progress Notes (Signed)
Patient ID: Sonya [name], female    DOB: [birth year], 63 y.o.   MRN: [id number]  HPI Comments: Sonya [name] is a 63 year old woman with a long history of smoking who stopped 4 years ago, chronic back pain with Harrington rods placed, anemia with iron deficiency, emphysema seen on CT scan, recent pneumonia x2 in May and August 2011, symptoms of swelling in her legs, workup with echocardiogram and stress test, previous symptoms of fatigue, shortness of breath and edema. She presents today for followup after episodes of chest pain.  In general she has been well managed. No significant diabetes, no smoking for many years, with relatively well-controlled cholesterol on a statin. She has chronic back pain, history of total knee replacements bilaterally in 2014, shortness of breath with exertion, chronic fatigue. She reports 2 episodes of chest pain one February 16 and 17th lasting for 20 minutes after she got out of bed associated with diaphoresis, back pain, facial numbness. Symptoms resolved without intervention. Second episode of chest pain several weeks ago again while at rest described as a stabbing pain with no other associated symptoms.   EKG shows normal sinus rhythm with rate of 80 beats per minute, no significant ST-T wave changes   stress test September 26 2009 showed no ischemia, poor exercise tolerance for her age, ejection fraction 71%   Echocardiogram done June 2011 shows normal systolic function, mild MR, normal right systolic function and pressure     Outpatient Encounter Prescriptions as of 05/10/2013  Medication Sig  . albuterol (PROVENTIL HFA;VENTOLIN HFA) 108 (90 BASE) MCG/ACT inhaler Inhale 2 puffs into the lungs every 6 (six) hours as needed.  Marland Kitchen albuterol (PROVENTIL HFA;VENTOLIN HFA) 108 (90 BASE) MCG/ACT inhaler Inhale 2 puffs into the lungs every 6 (six) hours as needed for wheezing (cough, shortness of breath or wheezing.).  Marland Kitchen clonazePAM (KLONOPIN) 1 MG tablet Take 1 mg by  mouth 2 (two) times daily as needed.   . docusate sodium (COLACE) 100 MG capsule Take 100 mg by mouth daily.    . DULoxetine (CYMBALTA) 60 MG capsule Take 30 mg by mouth daily. 3 tabs qd  . esomeprazole (NEXIUM) 40 MG capsule Take 1 capsule (40 mg total) by mouth daily.  . fexofenadine (ALLEGRA) 180 MG tablet Take 1 tablet (180 mg total) by mouth daily.  . fluticasone (FLONASE) 50 MCG/ACT nasal spray Place 2 sprays into both nostrils daily.  . furosemide (LASIX) 40 MG tablet Take 1 tablet (40 mg total) by mouth daily.  Marland Kitchen levothyroxine (SYNTHROID, LEVOTHROID) 88 MCG tablet Take 1 tablet (88 mcg total) by mouth daily.  . meloxicam (MOBIC) 15 MG tablet Take 15 mg by mouth daily.  . nitroGLYCERIN (NITROSTAT) 0.4 MG SL tablet Place 0.4 mg under the tongue every 5 (five) minutes as needed.    . OXYGEN-HELIUM IN Inhale 2 L into the lungs at bedtime.  . potassium chloride SA (K-DUR,KLOR-CON) 20 MEQ tablet Take 1 tablet (20 mEq total) by mouth daily.  . rosuvastatin (CRESTOR) 20 MG tablet Take 1 tablet (20 mg total) by mouth daily.  . traMADol (ULTRAM) 50 MG tablet Take 100 mg by mouth 4 (four) times daily.      Review of Systems  Constitutional: Positive for fatigue.  HENT: Negative.   Eyes: Negative.   Respiratory: Positive for chest tightness and shortness of breath.   Cardiovascular: Positive for chest pain.  Gastrointestinal: Negative.   Endocrine: Negative.   Musculoskeletal: Positive for gait problem.  Skin: Negative.   Allergic/Immunologic:  Negative.   Neurological: Negative.   Hematological: Negative.   Psychiatric/Behavioral: Negative.   All other systems reviewed and are negative.    BP 110/78  Pulse 80  Ht 5\' 2"  (1.575 m)  Wt 189 lb 12 oz (86.07 kg)  BMI 34.70 kg/m2  Physical Exam  Nursing note and vitals reviewed. Constitutional: She is oriented to person, place, and time. She appears well-developed and well-nourished.  HENT:  Head: Normocephalic.  Nose: Nose normal.   Mouth/Throat: Oropharynx is clear and moist.  Eyes: Conjunctivae are normal. Pupils are equal, round, and reactive to light.  Neck: Normal range of motion. Neck supple. No JVD present.  Cardiovascular: Normal rate, regular rhythm, S1 normal, S2 normal, normal heart sounds and intact distal pulses.  Exam reveals no gallop and no friction rub.   No murmur heard. Pulmonary/Chest: Effort normal and breath sounds normal. No respiratory distress. She has no wheezes. She has no rales. She exhibits no tenderness.  Abdominal: Soft. Bowel sounds are normal. She exhibits no distension. There is no tenderness.  Musculoskeletal: Normal range of motion. She exhibits no edema and no tenderness.  Lymphadenopathy:    She has no cervical adenopathy.  Neurological: She is alert and oriented to person, place, and time. Coordination normal.  Skin: Skin is warm and dry. No rash noted. No erythema.  Psychiatric: She has a normal mood and affect. Her behavior is normal. Judgment and thought content normal.    Assessment and Plan

## 2013-05-10 NOTE — Assessment & Plan Note (Signed)
Cholesterol is at goal on the current lipid regimen. No changes to the medications were made.  

## 2013-05-10 NOTE — Assessment & Plan Note (Signed)
I suspect her shortness of breath is likely multifactorial including underlying lung disease, deconditioning and obesity

## 2013-05-10 NOTE — Assessment & Plan Note (Signed)
She reports having significant snoring at nighttime, fatigued when she wakes up. She currently sleeps with oxygen

## 2013-05-10 NOTE — Patient Instructions (Addendum)
You are doing well. No medication changes were made.  We will schedule you for a lexiscan stress test for chest pain and shortness of breath  Please call us if you have new issues that need to be addressed before your next appt.   [place]  Your caregiver has ordered a Stress Test with nuclear imaging. The purpose of this test is to evaluate the blood supply to your heart muscle. This procedure is referred to as a "Non-Invasive Stress Test." This is because other than having an IV started in your vein, nothing is inserted or "invades" your body. Cardiac stress tests are done to find areas of poor blood flow to the heart by determining the extent of coronary artery disease (CAD). Some patients exercise on a treadmill, which naturally increases the blood flow to your heart, while others who are  unable to walk on a treadmill due to physical limitations have a pharmacologic/chemical stress agent called Lexiscan . This medicine will mimic walking on a treadmill by temporarily increasing your coronary blood flow.   Please note: these test may take anywhere between 2-4 hours to complete  PLEASE REPORT TO [facility] AT THE FIRST DESK WILL DIRECT YOU WHERE TO GO  Date of Procedure:_____Thursday, March 19___________  Arrival Time for Procedure:______7:30am______________  PLEASE NOTIFY THE OFFICE AT LEAST 24 HOURS IN ADVANCE IF YOU ARE UNABLE TO KEEP YOUR APPOINTMENT.  [phone] AND  PLEASE NOTIFY NUCLEAR MEDICINE AT [facility] AT LEAST 24 HOURS IN ADVANCE IF YOU ARE UNABLE TO KEEP YOUR APPOINTMENT. [phone]  How to prepare for your Myoview test:  1. Do not eat or drink after midnight 2. No caffeine for 24 hours prior to test 3. No smoking 24 hours prior to test. 4. Your medication may be taken with water.  If your doctor stopped a medication because of this test, do not take that medication. 5. Ladies, please do not wear dresses.  Skirts or pants are  appropriate. Please wear a short sleeve shirt. 6. No perfume, cologne or lotion. 7. Wear comfortable walking shoes. No heels!

## 2013-05-10 NOTE — Assessment & Plan Note (Signed)
Atypical chest pain, in general well managed on her medications. No significant EKG changes. She reports that she is unable to treadmill given her back pain, knee pain bilaterally, shortness of breath with exertion. As she is unable to exercise, we will order a lexiscan Myoview. She is concerned as she has significant family history of CAD

## 2013-05-11 ENCOUNTER — Ambulatory Visit: Payer: Self-pay | Admitting: Family Medicine

## 2013-05-12 ENCOUNTER — Ambulatory Visit: Payer: Self-pay | Admitting: Cardiovascular Disease

## 2013-05-12 ENCOUNTER — Other Ambulatory Visit: Payer: Self-pay

## 2013-05-12 DIAGNOSIS — R079 Chest pain, unspecified: Secondary | ICD-10-CM

## 2013-05-12 DIAGNOSIS — R0602 Shortness of breath: Secondary | ICD-10-CM

## 2013-05-20 ENCOUNTER — Encounter: Payer: Self-pay | Admitting: *Deleted

## 2013-05-20 DIAGNOSIS — Z1231 Encounter for screening mammogram for malignant neoplasm of breast: Secondary | ICD-10-CM

## 2013-06-01 ENCOUNTER — Ambulatory Visit: Payer: BC Managed Care – PPO | Admitting: Family Medicine

## 2013-06-20 ENCOUNTER — Ambulatory Visit: Payer: BC Managed Care – PPO | Admitting: Family Medicine

## 2013-07-04 DIAGNOSIS — E559 Vitamin D deficiency, unspecified: Secondary | ICD-10-CM | POA: Insufficient documentation

## 2013-07-05 DIAGNOSIS — M47814 Spondylosis without myelopathy or radiculopathy, thoracic region: Secondary | ICD-10-CM | POA: Insufficient documentation

## 2013-07-05 DIAGNOSIS — M47816 Spondylosis without myelopathy or radiculopathy, lumbar region: Secondary | ICD-10-CM | POA: Insufficient documentation

## 2013-07-14 ENCOUNTER — Encounter: Payer: Self-pay | Admitting: Family Medicine

## 2013-09-29 ENCOUNTER — Telehealth: Payer: Self-pay

## 2013-09-29 NOTE — Telephone Encounter (Signed)
SMITH - Pt is requesting an antibiotic for the infection in her body that is causing her to have skins sores all over her. I asked her if you had treated her for this and she says no.  However, she says you are aware of the situation and that you know about her going to the determatologist.  She says she will not be going back to them and that they are about to go out of town.  Can you call her in something for this?  [phone]

## 2013-09-29 NOTE — Telephone Encounter (Signed)
Spoke to pt, she states she will not return to derm. " they dont know what they are talking about".  Sh e states she will be going out of town on Monday.  i explained she would need to be seen in office for this issue before we could treat her, she was given Dr. Darliss Ridgel hours for 09/29/2013, and stated she would come ion to be seen today.

## 2013-10-26 ENCOUNTER — Other Ambulatory Visit: Payer: Self-pay | Admitting: Family Medicine

## 2013-10-26 ENCOUNTER — Ambulatory Visit (INDEPENDENT_AMBULATORY_CARE_PROVIDER_SITE_OTHER): Payer: BC Managed Care – PPO | Admitting: Family Medicine

## 2013-10-26 ENCOUNTER — Encounter: Payer: Self-pay | Admitting: Family Medicine

## 2013-10-26 VITALS — BP 120/90 | HR 81 | Temp 97.8°F | Resp 16 | Ht 62.0 in | Wt 186.8 lb

## 2013-10-26 DIAGNOSIS — E7800 Pure hypercholesterolemia, unspecified: Secondary | ICD-10-CM

## 2013-10-26 DIAGNOSIS — Z23 Encounter for immunization: Secondary | ICD-10-CM

## 2013-10-26 DIAGNOSIS — K219 Gastro-esophageal reflux disease without esophagitis: Secondary | ICD-10-CM

## 2013-10-26 DIAGNOSIS — R21 Rash and other nonspecific skin eruption: Secondary | ICD-10-CM

## 2013-10-26 DIAGNOSIS — E039 Hypothyroidism, unspecified: Secondary | ICD-10-CM

## 2013-10-26 DIAGNOSIS — R7309 Other abnormal glucose: Secondary | ICD-10-CM

## 2013-10-26 MED ORDER — ZOSTER VACCINE LIVE 19400 UNT/0.65ML ~~LOC~~ SOLR: 0.6500 mL | Freq: Once | SUBCUTANEOUS | Status: DC

## 2013-10-26 MED ORDER — RANITIDINE HCL 150 MG PO TABS: 150.0000 mg | ORAL_TABLET | Freq: Every day | ORAL | Status: DC

## 2013-10-26 NOTE — Progress Notes (Signed)
Subjective:    Patient ID: Sonya [name], female    DOB: 1951/01/04, 63 y.o.   MRN: [id number] This chart was scribed for Wardell Honour, MD by Cathie Hoops, ED Scribe. The patient was seen in Room 22. The patient's care was started at 11:25 AM.   10/26/2013  Follow-up  HPI HPI Comments: Sonya [name] is a 63 y.o. female who presents to the [facility] for a six month follow-up visit.  Neurotic Excoriations:  Pt states she is having a rash on her upper and lower extremities bilaterally onset 4 years ago. Pt notes it starts as a small bump and notes her rash doesn't heal well. Pt notes significant scarring associated with her rash. Pt is concerned if her Tramadol is causing her symptoms. Pt states she wants to see Dr. Ella Bodo for endocrinology. Pt states she was told by two dermatologist that she is scratching herself. Pt notes knee inflammation bilaterally s/p TKR B in 2014. Pt states she saw a surgeon and he saw inflammation but does not want to do surgery due to a weak immune system. Pt reports subjective fever but denies fever when she checks her body temperature. Pt also reports lower abdominal pain, indigestion, constipation, diarrhea or nausea. Pt denies vomiting or blood in stool. Pt notes she sees pain management every 3 months. Pt states she sees the knee doctor/ortho as needed. She sees psychiatry every 6 months. Pt denies seeing dermatologist recently.   Sonya [name] is a 6 month follow-up. I saw her on 04/26/13. Her cholesterol is under good control, thyroid function was normal, glucose was normal. She started Vitamin D 1000 units daily, 3 servings of calcium daily at her last visit, I recommended she call [facility] for her mammogram and to call Dr. Ida Rogue of cardiology. She saw cardiology on 3/17 and he ordered a stress test that was negative. She had her mammogram in March of this year.   Review of Systems  Constitutional: Positive  for fever. Negative for chills, diaphoresis and fatigue.  Eyes: Negative for visual disturbance.  Respiratory: Negative for cough, shortness of breath and wheezing.   Cardiovascular: Negative for chest pain, palpitations and leg swelling.  Gastrointestinal: Positive for nausea, abdominal pain, diarrhea and constipation. Negative for vomiting and blood in stool.  Endocrine: Negative for cold intolerance, heat intolerance, polydipsia, polyphagia and polyuria.  Musculoskeletal: Positive for arthralgias and back pain.  Skin: Positive for rash.  Neurological: Negative for dizziness, tremors, seizures, syncope, facial asymmetry, speech difficulty, weakness, light-headedness, numbness and headaches.  Psychiatric/Behavioral: Positive for dysphoric mood. Negative for suicidal ideas and self-injury. The patient is nervous/anxious.     Past Medical History  Diagnosis Date  . HLD (hyperlipidemia)   . Emphysema   . Anxiety   . Arthritis   . GERD (gastroesophageal reflux disease)   . Anemia   . Hiatal hernia   . UTI (urinary tract infection)     frequent  . Unspecified hypothyroidism   . Hyperglycemia   . Neurotic excoriations   . Squamous cell carcinoma   . Depressive disorder, not elsewhere classified   . Bruising   . Benign neoplasm of colon   . Degeneration of lumbar or lumbosacral intervertebral disc   . Personal history of urinary calculi   . Scoliosis (and kyphoscoliosis), idiopathic   . Tobacco use disorder   . Osteoporosis, unspecified   . Insomnia, unspecified   . Edema   . Pure hypercholesterolemia   .  Chronic pain syndrome   . Lumbosacral spondylosis without myelopathy   . Allergic rhinitis, cause unspecified   . Hematuria, unspecified   . Unspecified vitamin D deficiency   . Prurigo nodularis   . Pneumonia    Past Surgical History  Procedure Laterality Date  . Vaginal hysterectomy  1990  . 2 harrington rods in back  1980  . Tonsillectomy and adenoidectomy  1964  .  Cervical spine surgery  [place]  . Lumbar spine surgery  [place]  . Squamous cell carcinoma resection  02/2010    [name]  . Bunionectomy  2006    both feet  . Abdominal hysterectomy      endometriosis; ovaries resected; no cancer.  . Joint replacement  03/16/12    L TKR; R TKR   Allergies  Allergen Reactions  . Levofloxacin   . Sulfonamide Derivatives    Current Outpatient Prescriptions  Medication Sig Dispense Refill  . albuterol (PROVENTIL HFA;VENTOLIN HFA) 108 (90 BASE) MCG/ACT inhaler Inhale 2 puffs into the lungs every 6 (six) hours as needed.      Marland Kitchen albuterol (PROVENTIL HFA;VENTOLIN HFA) 108 (90 BASE) MCG/ACT inhaler Inhale 2 puffs into the lungs every 6 (six) hours as needed for wheezing (cough, shortness of breath or wheezing.).  3 Inhaler  3  . celecoxib (CELEBREX) 200 MG capsule Take 200 mg by mouth daily.      . clonazePAM (KLONOPIN) 1 MG tablet Take 1 mg by mouth 2 (two) times daily as needed.       . docusate sodium (COLACE) 100 MG capsule Take 100 mg by mouth daily.        . DULoxetine (CYMBALTA) 60 MG capsule Take 30 mg by mouth daily. 3 tabs qd      . esomeprazole (NEXIUM) 40 MG capsule Take 1 capsule (40 mg total) by mouth daily.  90 capsule  3  . fexofenadine (ALLEGRA) 180 MG tablet Take 1 tablet (180 mg total) by mouth daily.  90 tablet  3  . fluticasone (FLONASE) 50 MCG/ACT nasal spray Place 2 sprays into both nostrils daily.  16 g  11  . furosemide (LASIX) 40 MG tablet Take 1 tablet (40 mg total) by mouth daily.  90 tablet  3  . levothyroxine (SYNTHROID, LEVOTHROID) 88 MCG tablet Take 1 tablet (88 mcg total) by mouth daily.  90 tablet  3  . nitroGLYCERIN (NITROSTAT) 0.4 MG SL tablet Place 1 tablet (0.4 mg total) under the tongue every 5 (five) minutes as needed.  25 tablet  6  . OXYGEN-HELIUM IN Inhale 2 L into the lungs at bedtime.      . potassium chloride SA (K-DUR,KLOR-CON) 20 MEQ tablet Take 1 tablet (20 mEq total) by mouth daily.  90  tablet  3  . rosuvastatin (CRESTOR) 20 MG tablet Take 1 tablet (20 mg total) by mouth daily.  90 tablet  3  . traMADol (ULTRAM) 50 MG tablet Take 100 mg by mouth 4 (four) times daily.        . meloxicam (MOBIC) 15 MG tablet Take 15 mg by mouth daily.      . ranitidine (ZANTAC) 150 MG tablet Take 1 tablet (150 mg total) by mouth at bedtime.  30 tablet  2  . [START ON 11/27/2013] Tapentadol HCl 75 MG TABS 1 po qid prn      . zoster vaccine live, PF, (ZOSTAVAX) 58527 UNT/0.65ML injection Inject 19,400 Units into the skin once.  0.65 mL  0   No current facility-administered medications for this visit.       Objective:  Triage Vitals: BP 120/90  Pulse 81  Temp(Src) 97.8 F (36.6 C) (Oral)  Resp 16  Ht 5\' 2"  (1.575 m)  Wt 186 lb 12.8 oz (84.732 kg)  BMI 34.16 kg/m2  SpO2 98%  Physical Exam  Nursing note and vitals reviewed. Constitutional: She is oriented to person, place, and time. She appears well-developed and well-nourished. No distress.  HENT:  Head: Normocephalic and atraumatic.  Right Ear: External ear normal.  Left Ear: External ear normal.  Nose: Nose normal.  Mouth/Throat: Oropharynx is clear and moist. No oropharyngeal exudate.  Eyes: Conjunctivae and EOM are normal. Pupils are equal, round, and reactive to light. Right eye exhibits no discharge. Left eye exhibits no discharge. No scleral icterus.  Neck: Normal range of motion. Neck supple. Carotid bruit is not present. No thyromegaly present.  Cardiovascular: Normal rate, regular rhythm, normal heart sounds and intact distal pulses.  Exam reveals no gallop and no friction rub.   No murmur heard. Pulmonary/Chest: Effort normal and breath sounds normal. No respiratory distress. She has no wheezes. She has no rales.  Abdominal: Soft. Bowel sounds are normal. She exhibits no distension and no mass. There is tenderness in the epigastric area and left upper quadrant. There is no rebound and no guarding.  Musculoskeletal: Normal  range of motion. She exhibits no edema and no tenderness.  Lymphadenopathy:    She has no cervical adenopathy.  Neurological: She is alert and oriented to person, place, and time. No cranial nerve deficit. Coordination normal.  Skin: Skin is warm and dry. Rash noted. She is not diaphoretic. No erythema. No pallor.  Scattered excoriations along B forearms and B legs.    Psychiatric: She has a normal mood and affect. Her behavior is normal.  Tearful.   INFLUENZA VACCINE ADMINISTERED IN OFFICE.  Assessment & Plan:   1. Pure hypercholesterolemia   2. Other abnormal glucose   3. Unspecified hypothyroidism   4. Need for shingles vaccine   5. Rash and nonspecific skin eruption   6. Need for prophylactic vaccination and inoculation against influenza   7. Gastroesophageal reflux disease without esophagitis    1.  Hypercholesterolemia: controlled; obtain labs; continue Crestor. 2.  Glucose Intolerance: stable; continue with dietary modifications; recommend weight loss, exercise, low-sugar and low-carbohydrate food choices. 3.  Hypothyroidism: controlled; obtain labs; continue current medication. Agreeable to refer patient to endocrinology.  4.  Rash/Neurotic Excoriations: persistent for four years; s/p two dermatology consultations without improvement; agreeable to refer pt to [facility] dermatology.   Obtain autoimmune labs to rule out evolving autoimmune process considering diffuse arthralgias.  Consider referral to rheumatology in future. 5. GERD: worsening; add Zantac 150mg  qhs for one month; continue Nexium. 6.  Rx for Zostavax provided. 7.  S/p flu vaccine in office. 8. Anxiety with depression: stable; followed by psychiatry. 9. Chronic lower back pain: stable; followed by ortho and physiatry.    Meds ordered this encounter  Medications  . celecoxib (CELEBREX) 200 MG capsule    Sig: Take 200 mg by mouth daily.  . Tapentadol HCl 75 MG TABS    Sig: 1 po qid prn  . ranitidine (ZANTAC) 150 MG  tablet    Sig: Take 1 tablet (150 mg total) by mouth at bedtime.    Dispense:  30 tablet    Refill:  2  . zoster vaccine live, PF, (ZOSTAVAX) 65035 UNT/0.65ML  injection    Sig: Inject 19,400 Units into the skin once.    Dispense:  0.65 mL    Refill:  0    Return in about 6 months (around 04/26/2014) for complete physical examiniation.   I personally performed the services described in this documentation, which was scribed in my presence.  The recorded information has been reviewed and is accurate.    Reginia Forts, M.D.  [facility] [address] [place], [place]  [address] [phone] phone [phone] fax

## 2013-10-27 LAB — CBC WITH DIFFERENTIAL/PLATELET
BASOS PCT: 0 % (ref 0–1)
Basophils Absolute: 0 10*3/uL (ref 0.0–0.1)
EOS PCT: 0 % (ref 0–5)
Eosinophils Absolute: 0 10*3/uL (ref 0.0–0.7)
HEMATOCRIT: 44 % (ref 36.0–46.0)
HEMOGLOBIN: 14.3 g/dL (ref 12.0–15.0)
Lymphocytes Relative: 15 % (ref 12–46)
Lymphs Abs: 1.1 10*3/uL (ref 0.7–4.0)
MCH: 28.1 pg (ref 26.0–34.0)
MCHC: 32.5 g/dL (ref 30.0–36.0)
MCV: 86.6 fL (ref 78.0–100.0)
MONO ABS: 0.4 10*3/uL (ref 0.1–1.0)
MONOS PCT: 5 % (ref 3–12)
Neutro Abs: 6.1 10*3/uL (ref 1.7–7.7)
Neutrophils Relative %: 80 % — ABNORMAL HIGH (ref 43–77)
Platelets: 244 10*3/uL (ref 150–400)
RBC: 5.08 MIL/uL (ref 3.87–5.11)
RDW: 14.4 % (ref 11.5–15.5)
WBC: 7.6 10*3/uL (ref 4.0–10.5)

## 2013-10-27 LAB — COMPLETE METABOLIC PANEL WITH GFR
ALBUMIN: 4.5 g/dL (ref 3.5–5.2)
ALK PHOS: 104 U/L (ref 39–117)
ALT: 24 U/L (ref 0–35)
AST: 25 U/L (ref 0–37)
BUN: 11 mg/dL (ref 6–23)
CO2: 25 mEq/L (ref 19–32)
Calcium: 9.2 mg/dL (ref 8.4–10.5)
Chloride: 103 mEq/L (ref 96–112)
Creat: 0.63 mg/dL (ref 0.50–1.10)
GFR, Est African American: >89 mL/min
GLUCOSE: 115 mg/dL — AB (ref 70–99)
POTASSIUM: 4.5 meq/L (ref 3.5–5.3)
Sodium: 137 mEq/L (ref 135–145)
Total Bilirubin: 0.5 mg/dL (ref 0.2–1.2)
Total Protein: 7 g/dL (ref 6.0–8.3)

## 2013-10-27 LAB — LIPID PANEL
Cholesterol: 157 mg/dL (ref 0–200)
HDL: 48 mg/dL (ref >39)
LDL CALC: 79 mg/dL (ref 0–99)
Total CHOL/HDL Ratio: 3.3 Ratio
Triglycerides: 152 mg/dL — ABNORMAL HIGH (ref <150)
VLDL: 30 mg/dL (ref 0–40)

## 2013-10-27 LAB — HEMOGLOBIN A1C
Hgb A1c MFr Bld: 5.9 % — ABNORMAL HIGH (ref <5.7)
Mean Plasma Glucose: 123 mg/dL — ABNORMAL HIGH (ref <117)

## 2013-10-27 LAB — TSH: TSH: 0.423 u[IU]/mL (ref 0.350–4.500)

## 2013-10-29 LAB — RHEUMATOID FACTOR: Rhuematoid fact SerPl-aCnc: <10 IU/mL (ref <=14)

## 2013-11-01 LAB — ANA: Anti Nuclear Antibody(ANA): NEGATIVE

## 2013-11-10 ENCOUNTER — Encounter: Payer: Self-pay | Admitting: Family Medicine

## 2013-12-01 ENCOUNTER — Emergency Department: Payer: Self-pay | Admitting: Emergency Medicine

## 2013-12-01 LAB — BASIC METABOLIC PANEL
ANION GAP: 6 — AB (ref 7–16)
BUN: 18 mg/dL (ref 7–18)
CHLORIDE: 104 mmol/L (ref 98–107)
CO2: 29 mmol/L (ref 21–32)
CREATININE: 0.79 mg/dL (ref 0.60–1.30)
Calcium, Total: 8.6 mg/dL (ref 8.5–10.1)
EGFR (African American): >60
EGFR (Non-African Amer.): >60
Glucose: 107 mg/dL — ABNORMAL HIGH (ref 65–99)
Osmolality: 280 (ref 275–301)
Potassium: 4.3 mmol/L (ref 3.5–5.1)
Sodium: 139 mmol/L (ref 136–145)

## 2013-12-01 LAB — URINALYSIS, COMPLETE
Bacteria: NONE SEEN
Bilirubin,UR: NEGATIVE
Blood: NEGATIVE
Glucose,UR: NEGATIVE mg/dL (ref 0–75)
KETONE: NEGATIVE
Leukocyte Esterase: NEGATIVE
Nitrite: NEGATIVE
PH: 5 (ref 4.5–8.0)
Protein: NEGATIVE
RBC,UR: <1 /HPF (ref 0–5)
Specific Gravity: 1.021 (ref 1.003–1.030)
Squamous Epithelial: <1
WBC UR: 1 /HPF (ref 0–5)

## 2013-12-01 LAB — CBC
HCT: 37.9 % (ref 35.0–47.0)
HGB: 11.8 g/dL — ABNORMAL LOW (ref 12.0–16.0)
MCH: 27.7 pg (ref 26.0–34.0)
MCHC: 31.1 g/dL — AB (ref 32.0–36.0)
MCV: 89 fL (ref 80–100)
PLATELETS: 206 10*3/uL (ref 150–440)
RBC: 4.27 10*6/uL (ref 3.80–5.20)
RDW: 14.7 % — ABNORMAL HIGH (ref 11.5–14.5)
WBC: 9 10*3/uL (ref 3.6–11.0)

## 2013-12-01 LAB — TROPONIN I

## 2013-12-01 LAB — CK TOTAL AND CKMB (NOT AT ARMC)
CK, Total: 56 U/L
CK-MB: 0.9 ng/mL (ref 0.5–3.6)

## 2014-01-18 ENCOUNTER — Ambulatory Visit (INDEPENDENT_AMBULATORY_CARE_PROVIDER_SITE_OTHER): Payer: BC Managed Care – PPO | Admitting: Family Medicine

## 2014-01-18 ENCOUNTER — Encounter: Payer: Self-pay | Admitting: Family Medicine

## 2014-01-18 VITALS — BP 120/82 | HR 73 | Temp 98.9°F | Resp 16 | Ht 61.0 in | Wt 191.8 lb

## 2014-01-18 DIAGNOSIS — R296 Repeated falls: Secondary | ICD-10-CM

## 2014-01-18 DIAGNOSIS — G894 Chronic pain syndrome: Secondary | ICD-10-CM

## 2014-01-18 DIAGNOSIS — L309 Dermatitis, unspecified: Secondary | ICD-10-CM

## 2014-01-18 DIAGNOSIS — S22000A Wedge compression fracture of unspecified thoracic vertebra, initial encounter for closed fracture: Secondary | ICD-10-CM

## 2014-01-18 NOTE — Progress Notes (Signed)
Subjective:    Patient ID: Anab Vivar, female    DOB: 08-06-1950, 63 y.o.   MRN: [id number]  01/18/2014  Back Pain; Referral; and Wrist Pain   HPI This 63 y.o. female presents for acute visit for the following:   1.  Fall: fell December 01, 2013.  Has fallen several times in past few months.  At 2:00am, went upstairs.  Was laying watching TV; got up to urinate and got up and was finishing watching something and fell flat.  Hit head and injured L wrist.  L wrist is very painful; has splint that wears.  L wrist xrayed in ED at [facility] on 12/01/13.  Fracture of R posterior Harrington rod at T10.  Mild compression fracture T9 which is old.  Pt laid in the floor for five hours; tried calling husband who did not hear phone in sleep.  Was able to get up and communicate with husband.  Prescribed Dilaudid and Oxycodone.  S/p L wrist xray negative, CT thoracic.  Dr. Thomasene Lot called NS and spine specialist/Henry Elsner.  Saw Dr. Ellene Route on 12/05/2013.  Dr. Ellene Route stated that he is not pain management but is NS. Elsner did not recommend surgery.  Lumbar spine films negative in ED.  Was to see Dr. Rudene Christians for wrist but cancelled due to severity of pain.  Went November 12 to see Rudene Christians.  Before that appointment, Dr. Marcelino Scot suggested that he could not write for ______; he would write for Dilaudid and Oxycodone.  He was reluctant to write even that.  Recommended referring to [facility] pain clinic.  Got an appointment for 12/23/13.  Husband did some research and husband does not want pt to go to [facility].  Discussed with Dr. Ellene Route; pt asked to return to [facility] and Elnser supportive of this.  Dr. Ellene Route recommended Oxycontin 12 hour bid for pain control instead of multiple medications.  Suggested other pain clinics: Dr. Nicholaus Bloom, 2 at his office.  Tried to get into [facility] but absolutely refused.  Dr. Marcelino Scot called Dr. Clarice Pole office stating that pt was asking for Oxycontin.  Needed prior authorization on  Oxycontin.  Pt bought Oxycontin #20 while awaiting prior authorization.  Pt told by pharmacist that pt needed a new rx.  [facility]ner refused to give another script.  Pt then contacted [facility] to provide with another rx while awaiting appointment with another pain clinic.  Both physicians told the pain clinic not to take pt.   Pt has been calling pain clinics everywhere; has appointment on 01/28/14.  Need a referral.  [facility] on [address]  Has 3 offices.  One is in [place], one is in [place], one somewhere else.  Preferred pain management and spine care Dr. Vira Blanco is preference but need referral.  Taking Oxycontin 20mg  bid.   First fall was in [place] at daughter's house.  Thinks falling because R knee is giving out.  2. Chronic pain syndrome: has been followed by physiatrist/Dr. Marcelino Scot.    3.  Skin rash: s/p second opinion at [facility] dermatology; s/p biopsy that was benign.     Review of Systems  Constitutional: Positive for activity change. Negative for fever, chills, diaphoresis, appetite change and fatigue.  Respiratory: Negative for cough and shortness of breath.   Cardiovascular: Negative for chest pain, palpitations and leg swelling.  Gastrointestinal: Negative for nausea, vomiting, abdominal pain and diarrhea.  Musculoskeletal: Positive for myalgias, back pain, joint swelling, arthralgias and gait problem.  Skin: Positive for color change and  rash.  Neurological: Negative for dizziness, tremors, seizures, syncope, facial asymmetry, speech difficulty, weakness, light-headedness, numbness and headaches.  Psychiatric/Behavioral: Positive for sleep disturbance and dysphoric mood. Negative for suicidal ideas and self-injury. The patient is nervous/anxious.     Past Medical History  Diagnosis Date  . HLD (hyperlipidemia)   . Emphysema   . Anxiety   . Arthritis   . GERD (gastroesophageal reflux disease)   . Anemia   . Hiatal hernia   . UTI (urinary tract infection)     frequent  .  Unspecified hypothyroidism   . Hyperglycemia   . Neurotic excoriations   . Squamous cell carcinoma   . Depressive disorder, not elsewhere classified   . Bruising   . Benign neoplasm of colon   . Degeneration of lumbar or lumbosacral intervertebral disc   . Personal history of urinary calculi   . Scoliosis (and kyphoscoliosis), idiopathic   . Tobacco use disorder   . Osteoporosis, unspecified   . Insomnia, unspecified   . Edema   . Pure hypercholesterolemia   . Chronic pain syndrome   . Lumbosacral spondylosis without myelopathy   . Allergic rhinitis, cause unspecified   . Hematuria, unspecified   . Unspecified vitamin D deficiency   . Prurigo nodularis   . Pneumonia    Past Surgical History  Procedure Laterality Date  . Vaginal hysterectomy  1990  . 2 harrington rods in back  1980  . Tonsillectomy and adenoidectomy  1964  . Cervical spine surgery  [place]  . Lumbar spine surgery  [place]  . Squamous cell carcinoma resection  02/2010    [name]  . Bunionectomy  2006    both feet  . Abdominal hysterectomy      endometriosis; ovaries resected; no cancer.  . Joint replacement  03/16/12    L TKR; R TKR   Allergies  Allergen Reactions  . Levofloxacin   . Sulfonamide Derivatives    Current Outpatient Prescriptions  Medication Sig Dispense Refill  . albuterol (PROVENTIL HFA;VENTOLIN HFA) 108 (90 BASE) MCG/ACT inhaler Inhale 2 puffs into the lungs every 6 (six) hours as needed.    Marland Kitchen albuterol (PROVENTIL HFA;VENTOLIN HFA) 108 (90 BASE) MCG/ACT inhaler Inhale 2 puffs into the lungs every 6 (six) hours as needed for wheezing (cough, shortness of breath or wheezing.). 3 Inhaler 3  . celecoxib (CELEBREX) 200 MG capsule Take 200 mg by mouth daily.    . clonazePAM (KLONOPIN) 1 MG tablet Take 1 mg by mouth 2 (two) times daily as needed.     . docusate sodium (COLACE) 100 MG capsule Take 100 mg by mouth daily.      Marland Kitchen doxycycline (VIBRA-TABS) 100 MG tablet   4    . DULoxetine (CYMBALTA) 60 MG capsule Take 30 mg by mouth daily. 3 tabs qd    . esomeprazole (NEXIUM) 40 MG capsule Take 1 capsule (40 mg total) by mouth daily. 90 capsule 3  . fexofenadine (ALLEGRA) 180 MG tablet Take 1 tablet (180 mg total) by mouth daily. 90 tablet 3  . fluticasone (FLONASE) 50 MCG/ACT nasal spray Place 2 sprays into both nostrils daily. 16 g 11  . furosemide (LASIX) 40 MG tablet Take 1 tablet (40 mg total) by mouth daily. 90 tablet 3  . HYDROmorphone (DILAUDID) 2 MG tablet Take 2 mg by mouth 2 (two) times daily as needed.  0  . levothyroxine (SYNTHROID, LEVOTHROID) 88 MCG tablet Take 1 tablet (88 mcg  total) by mouth daily. 90 tablet 3  . nitroGLYCERIN (NITROSTAT) 0.4 MG SL tablet Place 1 tablet (0.4 mg total) under the tongue every 5 (five) minutes as needed. 25 tablet 6  . oxyCODONE (OXY IR/ROXICODONE) 5 MG immediate release tablet   0  . OXYCONTIN 20 MG T12A 12 hr tablet Take 20 mg by mouth every 12 (twelve) hours.  0  . OXYGEN-HELIUM IN Inhale 2 L into the lungs at bedtime.    . potassium chloride SA (K-DUR,KLOR-CON) 20 MEQ tablet Take 1 tablet (20 mEq total) by mouth daily. 90 tablet 3  . ranitidine (ZANTAC) 150 MG tablet Take 1 tablet (150 mg total) by mouth at bedtime. 30 tablet 2  . rosuvastatin (CRESTOR) 20 MG tablet Take 1 tablet (20 mg total) by mouth daily. 90 tablet 3  . Tapentadol HCl 75 MG TABS 1 po qid prn    . traMADol (ULTRAM) 50 MG tablet Take 100 mg by mouth 4 (four) times daily.      Marland Kitchen zoster vaccine live, PF, (ZOSTAVAX) 81017 UNT/0.65ML injection Inject 19,400 Units into the skin once. 0.65 mL 0  . meloxicam (MOBIC) 15 MG tablet Take 15 mg by mouth daily.     No current facility-administered medications for this visit.       Objective:    BP 120/82 mmHg  Pulse 73  Temp(Src) 98.9 F (37.2 C) (Oral)  Resp 16  Ht 5\' 1"  (1.549 m)  Wt 191 lb 12.8 oz (87 kg)  BMI 36.26 kg/m2  SpO2 98% Physical Exam  Constitutional: She is oriented to person,  place, and time. She appears well-developed and well-nourished. No distress.  HENT:  Head: Normocephalic and atraumatic.  Eyes: Conjunctivae are normal. Pupils are equal, round, and reactive to light.  Neck: Normal range of motion. Neck supple.  Cardiovascular: Normal rate, regular rhythm and normal heart sounds.  Exam reveals no gallop and no friction rub.   No murmur heard. Pulmonary/Chest: Effort normal and breath sounds normal. She has no wheezes. She has no rales.  Neurological: She is alert and oriented to person, place, and time.  Skin: She is not diaphoretic.  Psychiatric: She has a normal mood and affect. Her behavior is normal.  Tearful.  Nursing note and vitals reviewed.       Assessment & Plan:   1. Chronic pain syndrome   2. Thoracic compression fracture, closed, initial encounter   3. Dermatitis   4. Falls frequently     1.  Thoracic compression fracture acute:  New to this provider; reviewed [facility] records during visit; requesting referral to new pain clinic.  Concerned with on-line reviews of [place] [facility].  Dr. Sharlet Salina not comfortable prescribing Oxycontin and Dilaudid to patient for pain control.  Pt reports that she is currently only taking Oxycontin 20mg  bid. S/p NS consultation by Elsner who did not recommend surgery.  2.  Chronic pain syndrome: worsening due to recent fall and broken Harrington rod from previous surgery; refer to pain clinic.  Records reviewed. 3. Dermatitis: chronic; s/p evaluation by [facility] dermatology; s/p two skin punch biopsies.  Undergoing treatment for staph infection currently. 4. Frequent falls: New. Pt feels that knee giving out and is the cause of recent falls; denies near syncope or syncope related to falls.  No chest pain or palpitations.      Meds ordered this encounter  Medications  . OXYCONTIN 20 MG T12A 12 hr tablet    Sig: Take 20 mg by mouth every 12 (  twelve) hours.    Refill:  0  . doxycycline (VIBRA-TABS) 100 MG tablet     Sig:     Refill:  4  . HYDROmorphone (DILAUDID) 2 MG tablet    Sig: Take 2 mg by mouth 2 (two) times daily as needed.    Refill:  0  . oxyCODONE (OXY IR/ROXICODONE) 5 MG immediate release tablet    Sig:     Refill:  0    No Follow-up on file.    Reginia Forts, M.D.  [facility] [address] [place], [place]  [address] [phone] phone [phone] fax

## 2014-01-24 ENCOUNTER — Encounter: Payer: Self-pay | Admitting: Family Medicine

## 2014-02-07 ENCOUNTER — Encounter: Payer: Self-pay | Admitting: Family Medicine

## 2014-04-26 ENCOUNTER — Encounter: Payer: BC Managed Care – PPO | Admitting: Family Medicine

## 2014-05-24 ENCOUNTER — Ambulatory Visit (INDEPENDENT_AMBULATORY_CARE_PROVIDER_SITE_OTHER): Payer: BLUE CROSS/BLUE SHIELD | Admitting: Family Medicine

## 2014-05-24 ENCOUNTER — Encounter: Payer: Self-pay | Admitting: Family Medicine

## 2014-05-24 VITALS — BP 128/100 | HR 98 | Temp 98.1°F | Resp 16 | Ht 61.0 in | Wt 180.8 lb

## 2014-05-24 DIAGNOSIS — Z0000 Encounter for general adult medical examination without abnormal findings: Secondary | ICD-10-CM | POA: Diagnosis not present

## 2014-05-24 DIAGNOSIS — E038 Other specified hypothyroidism: Secondary | ICD-10-CM | POA: Diagnosis not present

## 2014-05-24 DIAGNOSIS — I878 Other specified disorders of veins: Secondary | ICD-10-CM | POA: Diagnosis not present

## 2014-05-24 DIAGNOSIS — E034 Atrophy of thyroid (acquired): Secondary | ICD-10-CM | POA: Diagnosis not present

## 2014-05-24 DIAGNOSIS — M8580 Other specified disorders of bone density and structure, unspecified site: Secondary | ICD-10-CM

## 2014-05-24 DIAGNOSIS — R030 Elevated blood-pressure reading, without diagnosis of hypertension: Secondary | ICD-10-CM | POA: Diagnosis not present

## 2014-05-24 DIAGNOSIS — J301 Allergic rhinitis due to pollen: Secondary | ICD-10-CM | POA: Diagnosis not present

## 2014-05-24 DIAGNOSIS — K219 Gastro-esophageal reflux disease without esophagitis: Secondary | ICD-10-CM | POA: Diagnosis not present

## 2014-05-24 DIAGNOSIS — G894 Chronic pain syndrome: Secondary | ICD-10-CM | POA: Insufficient documentation

## 2014-05-24 DIAGNOSIS — R7302 Impaired glucose tolerance (oral): Secondary | ICD-10-CM | POA: Diagnosis not present

## 2014-05-24 DIAGNOSIS — E785 Hyperlipidemia, unspecified: Secondary | ICD-10-CM | POA: Diagnosis not present

## 2014-05-24 DIAGNOSIS — IMO0001 Reserved for inherently not codable concepts without codable children: Secondary | ICD-10-CM

## 2014-05-24 LAB — LIPID PANEL
CHOL/HDL RATIO: 4.3 ratio
CHOLESTEROL: 163 mg/dL (ref 0–200)
HDL: 38 mg/dL — ABNORMAL LOW (ref >=46)
LDL Cholesterol: 90 mg/dL (ref 0–99)
TRIGLYCERIDES: 173 mg/dL — AB (ref <150)
VLDL: 35 mg/dL (ref 0–40)

## 2014-05-24 LAB — CBC WITH DIFFERENTIAL/PLATELET
BASOS ABS: 0 10*3/uL (ref 0.0–0.1)
Basophils Relative: 0 % (ref 0–1)
Eosinophils Absolute: 0.1 10*3/uL (ref 0.0–0.7)
Eosinophils Relative: 1 % (ref 0–5)
HCT: 46.6 % — ABNORMAL HIGH (ref 36.0–46.0)
Hemoglobin: 15.5 g/dL — ABNORMAL HIGH (ref 12.0–15.0)
Lymphocytes Relative: 15 % (ref 12–46)
Lymphs Abs: 1.7 10*3/uL (ref 0.7–4.0)
MCH: 28.5 pg (ref 26.0–34.0)
MCHC: 33.3 g/dL (ref 30.0–36.0)
MCV: 85.8 fL (ref 78.0–100.0)
MONO ABS: 0.8 10*3/uL (ref 0.1–1.0)
MPV: 9 fL (ref 8.6–12.4)
Monocytes Relative: 7 % (ref 3–12)
NEUTROS PCT: 77 % (ref 43–77)
Neutro Abs: 8.6 10*3/uL — ABNORMAL HIGH (ref 1.7–7.7)
Platelets: 324 10*3/uL (ref 150–400)
RBC: 5.43 MIL/uL — ABNORMAL HIGH (ref 3.87–5.11)
RDW: 14.5 % (ref 11.5–15.5)
WBC: 11.2 10*3/uL — AB (ref 4.0–10.5)

## 2014-05-24 LAB — COMPREHENSIVE METABOLIC PANEL
ALT: 22 U/L (ref 0–35)
AST: 25 U/L (ref 0–37)
Albumin: 4.7 g/dL (ref 3.5–5.2)
Alkaline Phosphatase: 113 U/L (ref 39–117)
BUN: 10 mg/dL (ref 6–23)
CO2: 26 meq/L (ref 19–32)
Calcium: 10.4 mg/dL (ref 8.4–10.5)
Chloride: 97 mEq/L (ref 96–112)
Creat: 0.71 mg/dL (ref 0.50–1.10)
Glucose, Bld: 96 mg/dL (ref 70–99)
Potassium: 3.7 mEq/L (ref 3.5–5.3)
Sodium: 140 mEq/L (ref 135–145)
TOTAL PROTEIN: 7.7 g/dL (ref 6.0–8.3)
Total Bilirubin: 0.7 mg/dL (ref 0.2–1.2)

## 2014-05-24 LAB — POCT URINALYSIS DIPSTICK
BILIRUBIN UA: NEGATIVE
GLUCOSE UA: NEGATIVE
Ketones, UA: NEGATIVE
LEUKOCYTES UA: NEGATIVE
Nitrite, UA: NEGATIVE
Protein, UA: NEGATIVE
RBC UA: NEGATIVE
Spec Grav, UA: 1.01
UROBILINOGEN UA: 0.2
pH, UA: 7

## 2014-05-24 MED ORDER — ROSUVASTATIN CALCIUM 20 MG PO TABS: 20.0000 mg | ORAL_TABLET | Freq: Every day | ORAL | Status: DC

## 2014-05-24 MED ORDER — AZELASTINE HCL 0.05 % OP SOLN: 1.0000 [drp] | Freq: Two times a day (BID) | OPHTHALMIC | Status: DC

## 2014-05-24 MED ORDER — LEVOTHYROXINE SODIUM 88 MCG PO TABS: 88.0000 ug | ORAL_TABLET | Freq: Every day | ORAL | Status: DC

## 2014-05-24 MED ORDER — FEXOFENADINE HCL 180 MG PO TABS: 180.0000 mg | ORAL_TABLET | Freq: Every day | ORAL | Status: DC

## 2014-05-24 MED ORDER — POTASSIUM CHLORIDE CRYS ER 20 MEQ PO TBCR: 20.0000 meq | EXTENDED_RELEASE_TABLET | Freq: Every day | ORAL | Status: DC

## 2014-05-24 MED ORDER — FUROSEMIDE 40 MG PO TABS
40.0000 mg | ORAL_TABLET | Freq: Every day | ORAL | Status: DC
Start: 2014-05-24 — End: 2015-09-14

## 2014-05-24 MED ORDER — ESOMEPRAZOLE MAGNESIUM 40 MG PO CPDR: 40.0000 mg | DELAYED_RELEASE_CAPSULE | Freq: Every day | ORAL | Status: DC

## 2014-05-24 NOTE — Progress Notes (Addendum)
Subjective:    Patient ID: Sonya [name], female    DOB: 01-27-1951, 64 y.o.   MRN: [id number]  05/24/2014  Annual Exam and Medication Refill   HPI This 64 y.o. female presents for Complete Physical Examination.  Last physical: 11/2012 Pap smear:  Hysterectomy. Mammogram:  05/11/2013; has been notified of follow-up appointment.  [facility]. Colonoscopy:  06/24/2010; repeat in 5 years. Bone density:  07/31/2011; [place]. TDAP:  2012 Pneumovax:  2011 Zostavax: 02/24/2014 Influenza:  Fall 2015 Eye exam:  +glasses; 2016; cataracts R early. Dental exam:  Every six months.  Allergic Rhinitis: taking Allegra and Flonase.    Chronic pain syndrome: followed by Dr. Vira Blanco and Dr. Andree Elk.  Going to perform procedure on 06/15/14; in severe pain.  Goes tomorrow morning for pain management.  Spinal cord stimulator.  Refuse to taken out broken rod.  No one will remove the broken rod.  Only comes once per week to [place].  Approval for stimulator.  If pain does not improve 50% for one week, then permanent stimulator will not be placed.  After one week, stimulator will be removed.  Does not know next step after that.  Daughter is getting married in June.  Not happy.    Review of Systems  Constitutional: Positive for activity change and appetite change. Negative for fever, chills, diaphoresis, fatigue and unexpected weight change.  HENT: Positive for congestion, postnasal drip, rhinorrhea, sinus pressure and sneezing. Negative for dental problem, drooling, ear discharge, ear pain, facial swelling, hearing loss, mouth sores, nosebleeds, sore throat, tinnitus, trouble swallowing and voice change.   Eyes: Negative for photophobia, pain, discharge, redness, itching and visual disturbance.  Respiratory: Positive for shortness of breath and wheezing. Negative for apnea, cough, choking, chest tightness and stridor.   Cardiovascular: Positive for leg swelling. Negative for chest pain and palpitations.  Gastrointestinal:  Positive for constipation. Negative for nausea, vomiting, abdominal pain, diarrhea, blood in stool, abdominal distention, anal bleeding and rectal pain.  Endocrine: Negative for cold intolerance, heat intolerance, polydipsia, polyphagia and polyuria.  Genitourinary: Negative for dysuria, urgency, frequency, hematuria, flank pain, decreased urine volume, vaginal bleeding, vaginal discharge, enuresis, difficulty urinating, genital sores, vaginal pain, menstrual problem, pelvic pain and dyspareunia.  Musculoskeletal: Positive for myalgias, back pain and arthralgias. Negative for joint swelling, gait problem, neck pain and neck stiffness.  Skin: Negative for color change, pallor, rash and wound.  Allergic/Immunologic: Positive for environmental allergies. Negative for food allergies and immunocompromised state.  Neurological: Negative for dizziness, tremors, seizures, syncope, facial asymmetry, speech difficulty, weakness, light-headedness, numbness and headaches.  Hematological: Negative for adenopathy. Does not bruise/bleed easily.  Psychiatric/Behavioral: Positive for sleep disturbance. Negative for suicidal ideas, hallucinations, behavioral problems, confusion, self-injury, dysphoric mood, decreased concentration and agitation. The patient is nervous/anxious. The patient is not hyperactive.     Past Medical History  Diagnosis Date  . HLD (hyperlipidemia)   . Emphysema   . Anxiety   . Arthritis   . GERD (gastroesophageal reflux disease)   . Anemia   . Hiatal hernia   . UTI (urinary tract infection)     frequent  . Unspecified hypothyroidism   . Hyperglycemia   . Neurotic excoriations   . Squamous cell carcinoma   . Depressive disorder, not elsewhere classified   . Bruising   . Benign neoplasm of colon   . Degeneration of lumbar or lumbosacral intervertebral disc   . Personal history of urinary calculi   . Scoliosis (and kyphoscoliosis), idiopathic   . Tobacco use  disorder   .  Osteoporosis, unspecified   . Insomnia, unspecified   . Edema   . Pure hypercholesterolemia   . Chronic pain syndrome   . Lumbosacral spondylosis without myelopathy   . Allergic rhinitis, cause unspecified   . Hematuria, unspecified   . Unspecified vitamin D deficiency   . Prurigo nodularis   . Pneumonia    Past Surgical History  Procedure Laterality Date  . Vaginal hysterectomy  1990  . 2 harrington rods in back  1980  . Tonsillectomy and adenoidectomy  1964  . Cervical spine surgery  [place]  . Lumbar spine surgery  [place]  . Squamous cell carcinoma resection  02/2010    [name]  . Bunionectomy  2006    both feet  . Joint replacement  03/16/12    L TKR; R TKR  . Abdominal hysterectomy      endometriosis; ovaries resected; no cancer.   Allergies  Allergen Reactions  . Levofloxacin   . Sulfonamide Derivatives    Current Outpatient Prescriptions  Medication Sig Dispense Refill  . albuterol (PROVENTIL HFA;VENTOLIN HFA) 108 (90 BASE) MCG/ACT inhaler Inhale 2 puffs into the lungs every 6 (six) hours as needed for wheezing (cough, shortness of breath or wheezing.). 3 Inhaler 3  . clonazePAM (KLONOPIN) 1 MG tablet Take 1 mg by mouth 2 (two) times daily as needed.     . doxycycline (VIBRA-TABS) 100 MG tablet   4  . DULoxetine (CYMBALTA) 60 MG capsule Take 30 mg by mouth daily. 3 tabs qd    . esomeprazole (NEXIUM) 40 MG capsule Take 1 capsule (40 mg total) by mouth daily. 90 capsule 3  . fexofenadine (ALLEGRA) 180 MG tablet Take 1 tablet (180 mg total) by mouth daily. 90 tablet 3  . fluticasone (FLONASE) 50 MCG/ACT nasal spray Place 2 sprays into both nostrils daily. 16 g 11  . furosemide (LASIX) 40 MG tablet Take 1 tablet (40 mg total) by mouth daily. 90 tablet 3  . levothyroxine (SYNTHROID, LEVOTHROID) 88 MCG tablet Take 1 tablet (88 mcg total) by mouth daily. 90 tablet 3  . nitroGLYCERIN (NITROSTAT) 0.4 MG SL tablet Place 1 tablet (0.4 mg total) under the  tongue every 5 (five) minutes as needed. 25 tablet 6  . oxyCODONE (OXY IR/ROXICODONE) 5 MG immediate release tablet   0  . OXYCONTIN 20 MG T12A 12 hr tablet Take 20 mg by mouth every 12 (twelve) hours.  0  . OXYGEN-HELIUM IN Inhale 2 L into the lungs at bedtime.    . ranitidine (ZANTAC) 150 MG tablet Take 1 tablet (150 mg total) by mouth at bedtime. 30 tablet 2  . rosuvastatin (CRESTOR) 20 MG tablet Take 1 tablet (20 mg total) by mouth daily. 90 tablet 3  . azelastine (OPTIVAR) 0.05 % ophthalmic solution Place 1 drop into both eyes 2 (two) times daily. 6 mL 12  . potassium chloride SA (K-DUR,KLOR-CON) 20 MEQ tablet Take 1 tablet (20 mEq total) by mouth daily. 90 tablet 3  . zoster vaccine live, PF, (ZOSTAVAX) 74163 UNT/0.65ML injection Inject 19,400 Units into the skin once. 0.65 mL 0   No current facility-administered medications for this visit.   History   Social History  . Marital Status: Married    Spouse Name: Kawana Hegel  . Number of Children: 1  . Years of Education: 12   Occupational History  . disabled     46   DDD  Social History Main Topics  . Smoking status: Former Smoker -- 1.00 packs/day for 25 years    Types: Cigarettes  . Smokeless tobacco: Not on file     Comment: quit 11/07  . Alcohol Use: No  . Drug Use: No  . Sexual Activity:    Partners: Male   Other Topics Concern  . Not on file   Social History Narrative   Marital status:  Married since 1992; second marriage; happily married; no abuse.      Children:  1 daughter; 3 stepchildren; 8 grandchildren; 2 gg.   Daughter in [place].  Husband's children in [place].      Employment:  Retired/disabled due to DDD lumbar.       Lives:   Lives with spouse;      Caffeine use cosumes a moderate amount 1 serving per day.      Always uses seat belts.       No guns in home.       Smoke alarm and carbon monoxide detectors in the home.       No Living Will.   Family History  Problem Relation Age of Onset  .  Coronary artery disease Brother     stents    SIster stents also  . Heart disease Brother   . Heart failure Brother     CABG  AMI @ 55  . Heart disease Brother   . Heart failure Mother     pacemaker  . Heart disease Mother     pacemaker; CHF  . Hypertension Mother   . Heart failure Father     AMI  . Cancer Father 86    colon cancer  . Heart disease Father     mutliple AMIs  . GI problems      Family history malignancy, GI tract  . Cancer Maternal Grandmother     ovarian  . Heart disease Sister 31    cardiac stenting  . Heart disease Brother   . Heart disease Brother         Objective:    BP 128/100 mmHg  Pulse 98  Temp(Src) 98.1 F (36.7 C) (Oral)  Resp 16  Ht 5\' 1"  (1.549 m)  Wt 180 lb 12.8 oz (82.01 kg)  BMI 34.18 kg/m2  SpO2 96% Physical Exam  Constitutional: She is oriented to person, place, and time. She appears well-developed and well-nourished. No distress.  HENT:  Head: Normocephalic and atraumatic.  Right Ear: External ear normal.  Left Ear: External ear normal.  Nose: Nose normal.  Mouth/Throat: Oropharynx is clear and moist.  Eyes: EOM are normal. Pupils are equal, round, and reactive to light. Right conjunctiva is injected. Left conjunctiva is injected.  Neck: Normal range of motion and full passive range of motion without pain. Neck supple. No JVD present. Carotid bruit is not present. No thyromegaly present.  Cardiovascular: Normal rate, regular rhythm and normal heart sounds.  Exam reveals no gallop and no friction rub.   No murmur heard. Pulmonary/Chest: Effort normal and breath sounds normal. She has no wheezes. She has no rales. Right breast exhibits no inverted nipple, no mass, no nipple discharge, no skin change and no tenderness. Left breast exhibits no inverted nipple, no mass, no nipple discharge, no skin change and no tenderness. Breasts are symmetrical.  Abdominal: Soft. Bowel sounds are normal. She exhibits no distension and no mass.  There is no tenderness. There is no rebound and no guarding.  Genitourinary: Vagina normal. There  is no rash, tenderness or lesion on the right labia. There is no rash, tenderness or lesion on the left labia. Right adnexum displays no mass, no tenderness and no fullness. Left adnexum displays no mass, no tenderness and no fullness.  atrophic  Musculoskeletal:       Right shoulder: Normal.       Left shoulder: Normal.       Cervical back: Normal.  Lymphadenopathy:    She has no cervical adenopathy.  Neurological: She is alert and oriented to person, place, and time. She has normal reflexes. No cranial nerve deficit. She exhibits normal muscle tone. Coordination normal.  Skin: Skin is warm and dry. Rash noted. She is not diaphoretic. No erythema. No pallor.  Scattered excoriations B forearms and B lower legs.  Psychiatric: She has a normal mood and affect. Her behavior is normal. Judgment and thought content normal.  Nursing note and vitals reviewed.  Results for orders placed or performed in visit on 05/24/14  POCT urinalysis dipstick  Result Value Ref Range   Color, UA yellow    Clarity, UA clear    Glucose, UA neg    Bilirubin, UA neg    Ketones, UA neg    Spec Grav, UA 1.010    Blood, UA neg    pH, UA 7.0    Protein, UA neg    Urobilinogen, UA 0.2    Nitrite, UA neg    Leukocytes, UA Negative        Assessment & Plan:   1. Routine physical examination   2. Hyperlipidemia   3. Glucose intolerance (impaired glucose tolerance)   4. Hypothyroidism due to acquired atrophy of thyroid   5. Osteopenia   6. Allergic rhinitis due to pollen   7. Gastroesophageal reflux disease without esophagitis   8. Venous stasis   9. Chronic pain syndrome   10. Blood pressure elevated     1. Complete Physical Examination: anticipatory guidance --- exercise, weight loss, 3 servings of dairy daily.  No longer warrants pap smears due to hysterectomy status. Mammogram due; will schedule at next  visit.  Due for bone density scan.  Colonoscopy UTD.  Immunizations reviewed and UTD. 2.  Hyperlipidemia: controlled; obtain labs; refill of Crestor provided. 3.  Glucose intolerance: stable; obtain labs; recommend weight loss, exercise, dietary modification. 4.  Hypothyroidism: controlled; obtain labs; refill provided. 5.  Osteopenia: stable; due for repeat bone density; will schedule at next visit after pain better addressed. 6.  Allergic rhinitis/conjunctivitis: worsening/uncontrolled; rx for Optivar provided. Continue Allegra and Flonase daily. 7. GERD: controlled; refill of Nexium provided. 8. Venous stasis: controlled; refill of Lasix provided. 9. Chronic pain syndrome: worsening due to recent rod fracture after trauma.  Scheduled for stimulator in upcoming month. 10. Blood pressure elevated: New.  Likely secondary to poorly controlled pain; monitor BP at home and at pain management visits.  If remains elevated, contact office.   Meds ordered this encounter  Medications  . esomeprazole (NEXIUM) 40 MG capsule    Sig: Take 1 capsule (40 mg total) by mouth daily.    Dispense:  90 capsule    Refill:  3  . fexofenadine (ALLEGRA) 180 MG tablet    Sig: Take 1 tablet (180 mg total) by mouth daily.    Dispense:  90 tablet    Refill:  3  . furosemide (LASIX) 40 MG tablet    Sig: Take 1 tablet (40 mg total) by mouth daily.    Dispense:  90 tablet    Refill:  3  . rosuvastatin (CRESTOR) 20 MG tablet    Sig: Take 1 tablet (20 mg total) by mouth daily.    Dispense:  90 tablet    Refill:  3  . azelastine (OPTIVAR) 0.05 % ophthalmic solution    Sig: Place 1 drop into both eyes 2 (two) times daily.    Dispense:  6 mL    Refill:  12  . levothyroxine (SYNTHROID, LEVOTHROID) 88 MCG tablet    Sig: Take 1 tablet (88 mcg total) by mouth daily.    Dispense:  90 tablet    Refill:  3  . potassium chloride SA (K-DUR,KLOR-CON) 20 MEQ tablet    Sig: Take 1 tablet (20 mEq total) by mouth daily.     Dispense:  90 tablet    Refill:  3    Return in about 6 months (around 11/24/2014) for recheck high cholesterol, GERD, allergies.     Lynna Zamorano Elayne Guerin, M.D. [facility] [address] [place], [place]  [address] [phone] phone [phone] fax

## 2014-05-24 NOTE — Patient Instructions (Signed)

## 2014-05-25 ENCOUNTER — Encounter: Payer: Self-pay | Admitting: Family Medicine

## 2014-05-25 LAB — T4, FREE: Free T4: 1.57 ng/dL (ref 0.80–1.80)

## 2014-05-25 LAB — TSH: TSH: 0.268 u[IU]/mL — ABNORMAL LOW (ref 0.350–4.500)

## 2014-05-25 LAB — HEMOGLOBIN A1C
HEMOGLOBIN A1C: 5.9 % — AB (ref <5.7)
MEAN PLASMA GLUCOSE: 123 mg/dL — AB (ref <117)

## 2014-06-16 NOTE — Discharge Summary (Signed)
PATIENT NAME:  Sonya [name], Sonya [name] MR#:  [id number] DATE OF BIRTH:  30-Jan-1951  DATE OF ADMISSION:  07/01/2012 DATE OF DISCHARGE:  07/05/2012  ADMITTING DIAGNOSIS: Right knee osteoarthritis.   DISCHARGE DIAGNOSIS:  Right knee osteoarthritis.  OPERATION: On 07/01/2012, the patient had a right total knee arthroplasty done by Dr. Rudene Christians   ASSISTANT:  April Berndt , nurse practitioner.   ANESTHESIA: General.   IMPLANTS USED:  GMK primary total knee system from [place], size 2 patella, size right 3 tibial tray, size 3 x 14 mm ultra-congruent tibial insert, 3 narrow right standard femoral component, all components were cemented.   COMPLICATIONS: None.   ESTIMATED BLOOD LOSS: 200 mL  TOURNIQUET TIME: 71 minutes.   The patient was stabilized, brought to the recovery room then brought down to the orthopedic floor where she was treated for pain control and physical therapy.   HISTORY: The patient is a 63 year old female who presented for persistent knee pain. The patient had a total knee replacement done a few months ago and did quite well on the left knee. The patient is being set up for a total knee replacement on the right and has been refractory to conservative treatment.   PHYSICAL EXAMINATION: GENERAL: Alert female with an antalgic component.   LUNGS: Clear.   HEART: Regular rate and rhythm.   MUSCULOSKELETAL: In regard to the left lower extremity, the patient has full extension and 120 degrees of flexion. The patient has pain along the medial joint line with discomfort with motion. The patient does have moderate swelling. The patient has crepitus with motion.   HOSPITAL COURSE: After initial admission on 07/01/2012, the patient was brought to the orthopedic floor. On postoperative day one, the patient was having significant pain management issues and was placed on a Demerol PCA pump. The patient had used this for 2 days and got better pain control. The patient was changed to oral  OxyContin and Nucynta and did quite well. The patient did have labs drawn initially on postoperative day one which hemoglobin of 11.1 and she did well with vitals that were stable. The patient worked with physical therapy, initially bed to chair and progressed up to ambulating over 200 feet and was independent in the room.   DISCHARGE INSTRUCTIONS: The patient to follow up with [facility] orthopedics in 2 weeks. The patient will go home with home health physical therapy. The patient did physical therapy doing gait training and range of motion activities. The patient is weight bear as tolerated on the right leg. The patient will elevate her leg with 1 to 2 pillows. The patient will use thigh-high TED hose on both legs, removed at nighttime.  The patient will elevate her heels off the bed and try to do coughing and deep breathing. The patient will use a regular diet. The patient will do Polar Care to decrease swelling. The patient will keep her dressing clean and dry and try not to get it wet. The patient will have physical therapy, change dressing if needed. The patient will call the clinic if there is any bright red bleeding, calf pain, or bowel or bladder difficulty, or fever greater than 101.5.   DISCHARGE MEDICATIONS: Resume home medication. The patient will use Nucynta 1 tablet q.4-6h. as needed for pain.  Xarelto 1 tablet in the morning, that is 10 mg.  Oxycodone 10 mg extended-release 1 tablet every 12 hours.    ____________________________ Lenna Sciara. Reche Dixon, [place] [id number] D: 07/05/2012 07:21:56 ET T: 07/05/2012 12:31:11 ET  JOB#: [id number]  cc: J. Reche Dixon, [place], <Dictator> J Natoya Viscomi [facility] PA ELECTRONICALLY SIGNED 07/16/2012 7:19

## 2014-06-16 NOTE — Consult Note (Signed)
Assessment/Plan:  Assessment/Plan:   Assessment 1. Fever: Negative workup so far. Repeat CXR with PA and lateral. If neg would suspect viral illness that is resolving. 2. Bruising with small hematoma Left thigh: Likely 2nd to [place]. Zithromax may potentiate Jennye Moccasin so will d/c zithromax and monitor Hgb and leg. 3. Anemia: Monitor would not transfuse at this point. 4. COPD: O2 at night , incentive spirometry.   Electronic Signatures: Harrel Lemon D (MD)  (Signed 27-Jan-14 13:01)  Authored: Assessment/Plan   Last Updated: 27-Jan-14 13:01 by Baxter Hire (MD)

## 2014-06-16 NOTE — Discharge Summary (Signed)
PATIENT NAME:  Sonya [name], Sonya [name] MR#:  [id number] DATE OF BIRTH:  1951/02/17  DATE OF ADMISSION:  03/16/2012 DATE OF DISCHARGE:  03/23/2012    ADMITTING DIAGNOSIS: Left knee osteoarthritis.   DISCHARGE DIAGNOSIS: Left knee osteoarthritis.   PROCEDURE: Left total knee arthroplasty.   SURGEON: Laurene Footman, M.D.   ASSISTANT: Rachelle Hora, PA-C.   ANESTHESIA: General.   ESTIMATED BLOOD LOSS: 100 mL.  TOURNIQUET TIME: 60 minutes at 300 mmHg.  IMPLANTS: Medacta GMK primary femoral component 3 narrow left standard, tibial tray fixed left size 3, tibial insert 12 mm height size 3 ultra congruent, and a size 2 resurfacing patella.   HISTORY: The patient is a 64 year old female who has been having left knee pain for years. She has had 2 Synvisc injections in the right knee and now 1 Synvisc injection in the left knee. She localizes pain diffusely to the left knee but also has pain in the popliteal and lateral aspects of the knee. She has no paresthesias. She reports her recent Synvisc injections did not work at all. She does not use a cane or walker. She has had no previous surgery on the left knee. She has no problems or history with anesthesia, bleeding or blood clotting. She has no family history of problems with anesthesia, bleeding or blood clotting.   PHYSICAL EXAMINATION:  GENERAL: Well developed, well nourished female in no acute distress.  NEUROLOGIC: Sensation is intact in the left lower extremity.  HEENT: Normocephalic, atraumatic. Pupils equal, round and reactive to light and accommodation. No dentures or partials.  CARDIOVASCULAR: Regular rate and rhythm. No murmurs. Capillary refill brisk in the left lower extremity. No edema of the left lower extremity. Left dorsalis pedis pulse 2+.  RESPIRATORY: Lungs clear to auscultation. No wheezing, rhonchi or crackles. Chest expansion is symmetric bilaterally.  ABDOMEN: Soft, nontender, no masses or distention. Bowel sounds positive in  all 4 quadrants. No hepatomegaly or splenomegaly.  MUSCULOSKELETAL: Gait is even and steady. No gross deformity of the left leg. Knee range of motion is 0 to 100 and painful with flexion. She is tender to palpation over the lateral joint line. Minimally tender to palpation over the medial joint line. Ligamentous exam is stable. Negative McMurray's. Lower extremity muscle strength is 5 out of 5.  SKIN: No scars, rashes or lesions noted.   HOSPITAL COURSE: The patient was admitted to the hospital on 03/16/2012. She had surgery that same day and was brought to the orthopedic floor from the PACU in stable condition. The patient was consulted by pulmonology, as she had asthmatic bronchitis. On postop day 1, she had a hemoglobin of 11. This was a mild postop acute blood loss anemia. She was in moderate pain. By 03/18/2012, she had fevers overnight to 101. The patient was given Tylenol and fevers were controlled throughout the day. Due to moderate pain, she was switched to Nucynta, which did provide the patient with much better control. On 03/19/2012, the patient was noted to have a fever to 101.2 overnight. A UA was ordered and was negative. Chest x-ray showed infiltrates versus atelectasis in the left upper lobe. Blood cultures were ordered, which were also negative. Pulmonary started patient on Zithromax and Zosyn. On 03/20/2012, the patient continued to have overnight fevers. She was progressing well with physical therapy. On 03/21/2012, the patient's fevers did improve overnight. She had some slight diarrhea. On 03/22/2012, she was afebrile overnight. She continued to have diarrhea so C. diff was ordered, which was negative. It  was thought most likely that her fevers and diarrhea symptoms were most likely due to a viral illness and so some of the antibiotics were discontinued. She did continue on Ceftin p.o. On 03/23/2012, the patient was afebrile now for 48 hours. Her asthmatic bronchitis was stable and  hemoglobin and vital signs remained stable. She had progressed well with physical therapy, ambulating up to 200 feet. She was doing well and ready for discharge home with home health physical therapy.   DISCHARGE INSTRUCTIONS: The patient may gradually increase weight-bearing on the affected extremity, elevate the affected foot or leg on 1 or 2 pillows with the foot higher than the knee. Thigh-high TED hose on both legs and remove at bedtime. She is to use incentive spirometry every 1 hour while awake and encourage cough and deep breathing every 1 hour. She may resume a regular diet as tolerated. She needs to continue using PolarCare unit, maintaining temperature between 40 and 50 degrees. Do get the dressing or bandage wet or dirty. Call [place] if the dressing gets water under it. Leave the dressing on. Call [place] if any of the following occur: Bright red bleeding from the incision or wound, fever above 101.5 degrees, redness, swelling, or drainage at the incision. Call [place] if you experience increased leg pain, numbness or weakness in your legs or bowel or bladder symptoms. She is referred to home physical therapy. She needs to give [place] a call if a therapist has not contacted her within 48 hours of her return home. She has appointment with [facility] in 7 to 10 days as well as with Dr. Raul Del in 7 to 10 days.   MEDICATIONS: Ventolin HFA 4 times a day as needed, nitroglycerins 0.4 sublingual 1 sublingual as needed, Allegra 24-Hour oral tablet 1 tablet orally once a day, fluticasone 50 mcg inhalation powder 2 puffs inhaled once a day at bedtime, Klonopin 1 mg oral tablet 1 tablet orally 2 times a day, potassium chloride 20 mEq oral tablet 1 tablet orally once a day, DuoNeb 2.5 mg/0.5 mg 3 mL inhalation solution 3 mL inhaled 4 times a day as needed for shortness of breath, docusate sodium 100 mg oral capsule 2 caps orally once a  day as needed for constipation, Cymbalta 30 mL oral delayed-release capsule 3 capsules orally once a day, esomeprazole 40 mg oral delayed-release capsule 1 cap orally once a day, oxygen 2 liters at night (2 liters nasal once a day at bedtime), zolpidem 12.5 mg oral delayed-release extended tablet 1 tablet orally once a day at bedtime, atorvastatin 40 mg oral tablet 1 tablet orally once a day at bedtime, Lasix 40 mg oral tablet 1 tablet orally once a day at bedtime, Synthroid 88 mcg oral tablet 1 tablet orally once a day, albuterol 2 puffs inhaled every 6 hours as needed for shortness of breath, Tylenol 500 mg oral tablet 1 tablet orally every 4 hours as needed for pain or temperature greater than 100.4, Nucynta 75 mg oral tablet 1 tablet orally every 4 hours as needed for pain, milk of magnesia 30 mL oral 2 times a day as needed, Xarelto 10 mg oral tablet 1 tablet orally once a day for 6 days, bisacodyl 10 mg rectal suppository 1 suppository rectal once a day as needed for constipation, Senokot 50/8.6 mg oral tablet 1 tab orally 2 times a day.   ____________________________ Duanne Guess, PA-C tcg:jm D: 03/23/2012 12:55:48 ET T: 03/23/2012 13:28:05 ET JOB#:  [id number]  cc: Duanne Guess, PA-C, <Dictator> Duanne Guess [place] ELECTRONICALLY SIGNED 03/30/2012 10:38

## 2014-06-16 NOTE — Op Note (Signed)
PATIENT NAME:  Sonya [name], Sonya [name] MR#:  [id number] DATE OF BIRTH:  02-May-1950  DATE OF PROCEDURE:  07/01/2012  PREOPERATIVE DIAGNOSIS: Right knee osteoarthritis.   POSTOPERATIVE DIAGNOSIS: Right knee osteoarthritis.   PROCEDURE: Right total knee replacement.   SURGEON: Laurene Footman, MD   ASSISTANT: April Berndt, NP  ANESTHESIA: General.   DESCRIPTION OF PROCEDURE: The patient was brought to the operating room, and after adequate anesthesia was obtained the right leg was prepped and draped in the usual sterile fashion. After patient identification and timeout procedures were completed, appropriate patient identification and timeout procedures were completed. The leg was exsanguinated with an Esmarch, and a midline skin incision was made with the knee in flexion.  A medial parapatellar arthrotomy was carried out, and there was significant medial and patellofemoral arthritis with milder lateral compartment arthrosis.  The infrapatellar fat pad and ACL were excised along with the anterior horns of the menisci. The proximal tibia was exposed for application of the Medacta cutting block. After this, appropriate alignment was obtained with the block, proximal tibial cut carried out and the proximal tibia bone removed. It matched the bone model. The femur was approached in a similar fashion, first removing articular cartilage in the area of the block, and then performing the distal cut with 2 pins in the distal femur for rotation.  The 3   4-in-1 cutting block was applied to the distal femur.  After checking to make sure it would not notch the femur, anterior, posterior, and chamfer cuts were carried out. The trial components were replaced at this time after removing the posterior horns of the menisci with the proximal drill hole in the tibia and a V-cut to hold it in place and for rotational alignment, followed by a 10 mm spacer and the femoral component.  With the 10 mm implant, there was instability and  trials were made up to a 14, which gave excellent stability. The distal femoral drill holes were then made and the notch cut in the trochlea.  The patella was then cut with the guide, and it was drilled and measured a size 2. The trial components were removed at this time and Exparel was injected in the periarticular tissue. Next, the knee was thoroughly irrigated and bony  surfaces dried. The tibial component was cemented in place first, followed by the polyethylene component on the tibia with excess cement removed, followed by placement of the femoral component. The knee was held in extension as the patellar button was clamped into place. After the cement had set, excess cement was removed and the knee again thoroughly irrigated. The tourniquet was let down and hemostasis checked with electrocautery. The arthrotomy was repaired using a heavy quill suture, 2-0 quill subcutaneously and skin staples. Prior to closure of the wound, a combination of Marcaine, Toradol and morphine were injected into the periarticular tissue to aid in postop analgesia. After closing the skin with staples, Xeroform, 4 x 4's, ABD, Webril, Polar Care and Ace wrap were applied. The patient was sent to the recovery room in stable condition along with a knee immobilizer.   ESTIMATED BLOOD LOSS: 200 mL.  TOURNIQUET TIME: 71 minutes.   SPECIMENS: Cut ends of bone.   IMPLANTS: GMK primary total knee system from [place], size 2 patella, size right 3 tibia tray, a size 3,14 mm Ultracongruent tibial insert, and a 3 narrow right standard femoral component, all components cemented.   COMPLICATIONS: None.   CONDITION TO RECOVERY ROOM: Stable.   ____________________________  Laurene Footman, MD mjm:cb D: 07/01/2012 19:02:12 ET T: 07/01/2012 20:51:48 ET JOB#: [id number]  cc: Laurene Footman, MD, <Dictator> Laurene Footman MD ELECTRONICALLY SIGNED 07/02/2012 12:47

## 2014-06-16 NOTE — Consult Note (Signed)
PATIENT NAME:  Sonya [name], Sonya [name] MR#:  [id number] DATE OF BIRTH:  15-Feb-1951  DATE OF CONSULTATION:  03/22/2012  REFERRING PHYSICIAN:  Hessie Knows, MD   CONSULTING PHYSICIAN:  Baxter Hire, MD  REASON FOR CONSULTATION: Fever.   HISTORY OF PRESENT ILLNESS: This is a 64 year old female who was admitted on January 21st for a left total knee replacement. On the 23rd, she started spiking fevers up to 101.1 and has continued to spike fevers on and off, although now the fever spike is trending down. Her work-up was negative so far and included urinalysis, chest x-ray, blood cultures and stool for C. diff. She has not had a white blood cell count. She does have a history of COPD and coughs sometimes. She does have some chronic pain symptoms. She said she has had some body aches worse than usual. She started developing some diarrhea a couple of days ago, but it is now getting better with formed stools. She does complain of some vague abdominal pain but nothing specific.   PAST MEDICAL HISTORY: 1. GERD.  2. Hypercholesterolemia.  3. Depression.  4. History of kidney stones.  5. Hypothyroidism.  6. Osteoporosis.  7. COPD, on oxygen at night.  8. Sleep apnea.   PAST SURGICAL HISTORY: Back surgery with 2 rods placed, tonsillectomy, hysterectomy, the recent total left knee replacement.   ALLERGIES: LEVAQUIN AND SULFUR.   CURRENT MEDICATIONS: Tylenol Extra Strength 1000 mg every 4 hours p.r.n., Albuterol inhaler q. 6 p.r.n., Lipitor 40 mg daily, clonazepam 1 mg b.i.d., Dulcolax 10 mg p.r.n. daily,, Cymbalta 30 mg daily, Nexium 40 mg daily, Allegra 180 mg daily, Flonase nasal spray 2 sprays each nostril daily, Lasix 40 mg daily, Synthroid 88 mcg daily, morphine 1 to 4 every 2 to 4 p.r.n., nitroglycerin 0.4 mg sublingual p.r.n. chest pain, KCl 20 mEq b.i.d., Ambien 5 mg p.r.n. at bedtime, Xarelto 10 mg daily, Zithromax 500 mg IV q. 24, Zosyn 3.375 mg q. 8 hours, Nucynta 75 to 150 mg q. 4 hours p.r.n.  pain.   SOCIAL HISTORY: Does not smoke and does not drink alcohol.   FAMILY HISTORY: Significant for hyperlipidemia and coronary artery disease.   REVIEW OF SYSTEMS:  CONSTITUTIONAL: She has had fever and chills.  EYES: No blurred vision.  PULMONARY: No shortness of breath.  GASTROINTESTINAL: No nausea, vomiting, or diarrhea. She has had no nausea, but she has had diarrhea.  GENITOURINARY: No dysuria.  ENDOCRINE: No heat or cold intolerance.  INTEGUMENTARY:  She has had some rash on her upper arms.  NEUROLOGIC: No numbness or weakness.  HENT: No hearing loss.  CARDIOVASCULAR: No chest pain.   PHYSICAL EXAMINATION: VITAL SIGNS: Temperature is 97.2, pulse 93, respirations 18, blood pressure 121/66, O2 saturation is 100% on room air.  GENERAL: This is a well-nourished white female in no acute distress.  HEENT: The pupils are equal, round, and reactive to light. Sclerae are anicteric. Oral mucosa is moist. Oropharynx is clear. Nasopharynx is clear.  NECK: Supple. No JVD, lymphadenopathy. No thyromegaly.  CARDIOVASCULAR: Regular rate and rhythm. No murmurs, rubs, or gallops.  LUNGS: Clear to auscultation. No dullness to percussion. She is not using accessory muscles.  ABDOMEN: Soft and nondistended. Bowel sounds are positive. No hepatosplenomegaly. No masses. There is just some mild diffuse tenderness with no rebound or guarding.  EXTREMITIES: She has 1+ lower extremity edema on the right. The left has about 1 to 2+.  NEUROLOGIC: Cranial nerves II through XII are intact. She is alert and  oriented x4.  SKIN: The left lower extremity has ecchymosis on the shin and calf, a large area in the upper inner thigh with a very small indurated area that could possibly be a hematoma. Her forearms have flaky lesions that are red. She has no lymphadenopathy in the axillary or submandibular lymph nodes.   LABORATORY DATA: Stool is negative for C. diff. Urinalysis shows no sign of infection. White blood  cells are 8.6, hemoglobin is 8.6, platelets are 229. BUN is 8, creatinine 0.64, sodium 137, potassium 3.5. Blood cultures are negative x 36 hours.   ASSESSMENT AND PLAN: 1. Fever: At this point, I am not sure what the source is.  She has had extensive workup. Blood cultures are negative. Urinalysis was negative. Chest x-ray showed some atelectasis versus infiltrate, and that was 3 days ago. Her C. diff test was negative. Diarrhea is getting better. I am going to go ahead and repeat a chest x-ray with a PA and lateral to rule out any type of infiltrate. If that rules out, then I will consider stopping antibiotics as I do not see any other sign of infection.  This is acting more like a viral-type illness that appears to maybe be running its course as the fever curve has come down significantly. She did test negative for influenza, though.  2. Bruising with possible small hematoma in the left upper thigh: This may be some oozing from the Xarelto. She is also on Zithromax which, upon literature search, may possibly interact with Xarelto as far as potentiating its effect. She has already had 3 days of Zithromax. I am going to go ahead and discontinue that.  I will observe her leg and also watch her hemoglobin to see if this worsens any, otherwise it should resolve on its own.  3. Chronic obstructive pulmonary disease: I will continue her inhalers and her nightly O2.  4. Anemia of acute blood loss: At this point, I see no reason to transfuse her. I will watch her hemoglobin, especially with the underlying bruising we saw on her leg, for any further  developments.   TIME SPENT ON CONSULTATION: 45 minutes.   ____________________________ Baxter Hire, MD jdj:cb D: 03/22/2012 13:11:44 ET T: 03/22/2012 14:09:22 ET JOB#: [id number]  cc: Baxter Hire, MD, <Dictator> Laurene Footman, MD Renette Butters. Tamala Julian, MD Herbon E. Raul Del, MD Baxter Hire MD ELECTRONICALLY SIGNED 03/23/2012 17:21

## 2014-06-16 NOTE — Op Note (Signed)
PATIENT NAME:  Sonya [name], RIEDE MR#:  [id number] DATE OF BIRTH:  1950/10/27  DATE OF PROCEDURE:  03/16/2012  PREOPERATIVE DIAGNOSIS: Left knee osteoarthritis.   POSTOPERATIVE DIAGNOSIS: Left knee osteoarthritis.   PROCEDURE: Left total knee arthroplasty.   SURGEON: Laurene Footman, MD  ASSISTANT: Rachelle Hora, PA-C   ANESTHESIA: General.   DESCRIPTION OF PROCEDURE: The patient was brought to the Operating Room, and after adequate general anesthesia was obtained, the left leg was prepped and draped in the usual sterile fashion with a tourniquet applied to the upper thigh and an Alvarado legholder utilized. After appropriate patient identification and timeout procedures were completed, the leg was exsanguinated with an Esmarch and the tourniquet raised to 300 mmHg. A midline skin incision was made, followed by a medial parapatellar arthrotomy. There were significant areas of complete cartilage loss, particularly the femoral trochlea and patella, and extensive partial thickness cartilage loss to all the cartilage surfaces with abnormal-appearing articular cartilage. The fat pad and ACL were excised and proximal tibia exposure carried out to place the Medacta proximal tibia cutting block. After alignment was checked and pins placed to hold it in position, the proximal tibia cut was carried out with appropriate resection. Next, the proximal femur was adequately exposed and the cutting block applied. The distal cuts were then carried out, and a 3 cutting block applied, anterior, posterior and chamfer cuts made without notching. At this point, the residual posterior horns of the menisci were excised, and a 3 tibial trial tray was inserted, alignment based on the pin off the initial cutting block. Pins were placed, proximal drill hole  carried out, followed by the notch cut, and this was left in place for rotational stability and alignment, to hold the tibial tray in position. A 10 mm insert was placed,  followed by the femoral component. The knee could be placed in full extension and flexion with just slight opening to varus and valgus stress in mid flexion. Going up to the 12 mm insert, there was excellent stability, full extension and flexion, with good patellar tracking. There was a lateral plica band which was released, but without formal lateral release. At this point, with the appropriate positioning of the implants, the notch cut was made for the trochlear groove and distal femoral drill holes carried out. The patella was noted to have extensive wear. The patellar cutting guide applied and 10 mm resection carried out. The patella sized to a size 2, with 3 drill holes made for the pegs on the patella. Placed through a range of motion, again full extension and flexion with good patellar tracking with no-touch technique. At this point, the trial components were all removed and the bony surfaces thoroughly irrigated and dried. A combination of 10 mg of morphine and 30 mL of 0.25% Sensorcaine with epinephrine and 30 mg of Toradol along with saline was infiltrated into the posterior aspect of the capsule as well as along the medial arthrotomy for postop analgesia. The tibial component was cemented into place first, with excess cement removed, followed by placement of the tibial polyethylene component. The femoral component was impacted into place, and there was no overhang medial or lateral. The knee was held in extension as the patellar button was clamped into place, with all components being cemented. After the cement had set and excess cement had been removed, the tourniquet was let down and hemostasis checked with electrocautery. The arthrotomy was repaired using a heavy quill suture, followed by 2-0 quill subcutaneously and skin  staples. Xeroform, 4 x 4's, Webril, ABDs, Polar Care and Ace wrap were applied along with knee immobilizer, and the patient was sent to the recovery room in stable condition.    ESTIMATED BLOOD LOSS: 100 mL.   TOURNIQUET TIME: 60 minutes at 300 mmHg.   IMPLANTS: Medacta GMK Primary, femoral component 3, narrow, left, standard; tibial tray fixed, left, size 3; tibial insert 12 mm height, size 3, ultracongruent; and a size 2 resurfacing patella.   SPECIMEN: Cut ends of bone.   CONDITION: To recovery room, stable.   COMPLICATIONS: None.    ____________________________ Laurene Footman, MD mjm:OSi D: 03/16/2012 20:00:16 ET T: 03/17/2012 06:43:46 ET JOB#: [id number]  cc: Laurene Footman, MD, <Dictator> Laurene Footman MD ELECTRONICALLY SIGNED 03/17/2012 7:45

## 2014-06-19 ENCOUNTER — Encounter: Payer: Self-pay | Admitting: Family Medicine

## 2014-06-23 ENCOUNTER — Other Ambulatory Visit: Payer: Self-pay

## 2014-06-23 MED ORDER — ROSUVASTATIN CALCIUM 20 MG PO TABS: 20.0000 mg | ORAL_TABLET | Freq: Every day | ORAL | Status: DC

## 2014-10-19 ENCOUNTER — Ambulatory Visit: Payer: Self-pay | Admitting: Psychiatry

## 2014-10-23 IMAGING — CR DG THORACIC SPINE 2-3V
1 series · 2 of 2 positions shown · non-contrast
Comparison: Lumbar spine series of today's date and prior lumbar
spine series of January 29, 2010.

CLINICAL DATA: Status post fall now with pain in the lower in the
mid and lower back; previous Harrington rod placement.

EXAM:
THORACIC SPINE - 2 VIEW

[Series 1: dxr thoracic  ap and lateral · 0.14mm/px · 2 of 2 slices shown]
[im 1/2]
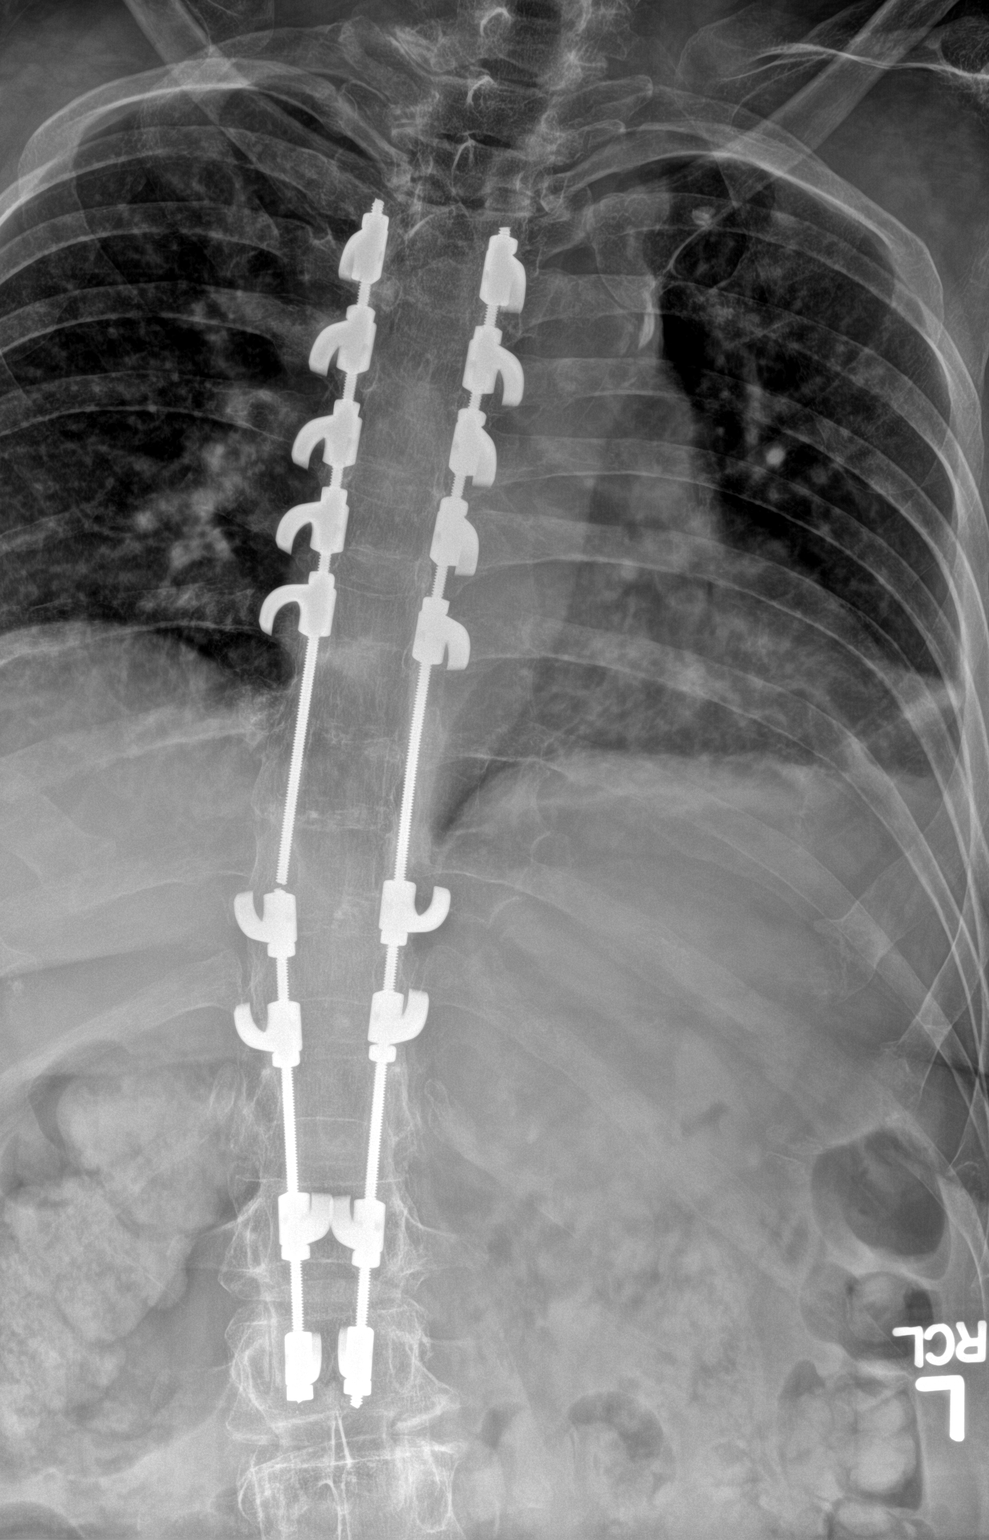
[im 2/2]
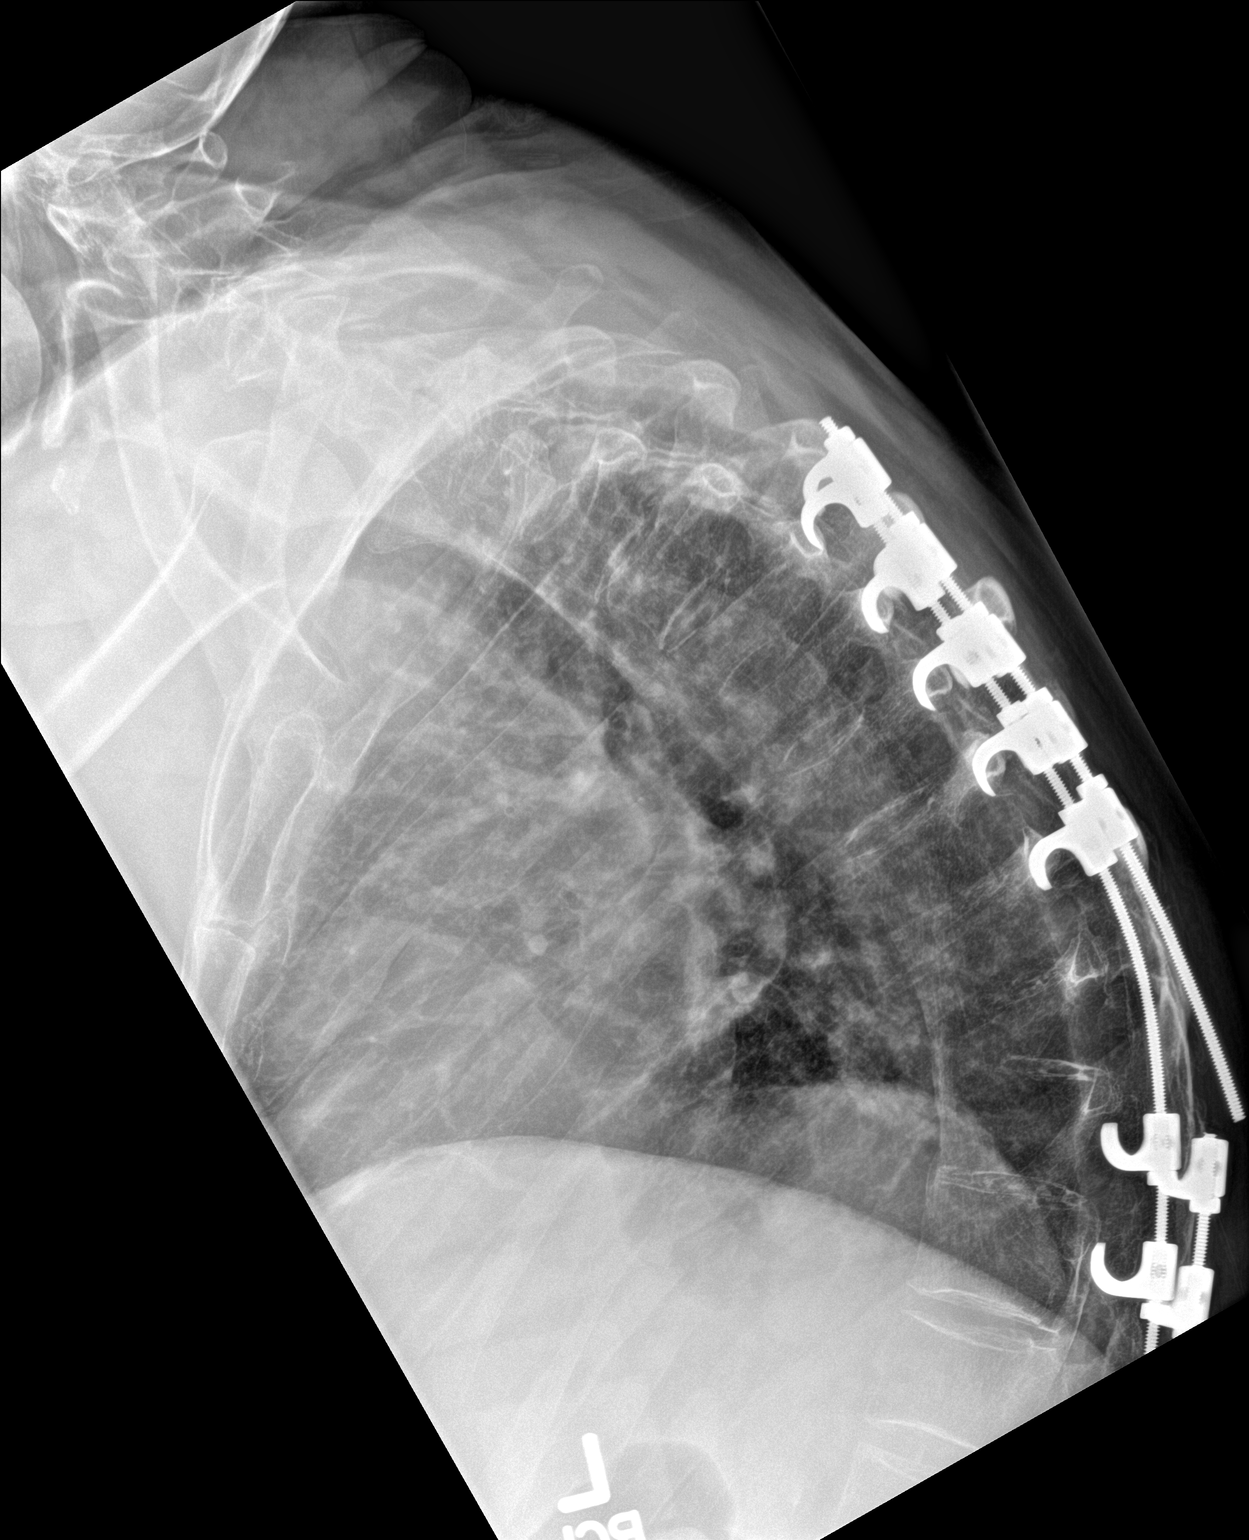

[2 of 2 positions shown; findings below may reference images not displayed]

FINDINGS: There are bilateral Harrington rods from T4-L2. There is a fracture
of the right-sided Harrington rod at T9-10 which is new since study
January 29, 2010. There is wedge compression of T9 with loss of
height anteriorly of approximately 50% which is unchanged since the
lateral chest x-ray dated March 22, 2012. Fine detail in this
region quite limited due to osteopenia. More superiorly no definite
acute fracture is demonstrated.
IMPRESSION: There is a fracture of the right-sided Harrington rod at the T9
level. There is stable wedge compression of the body of T9 amounting
to approximately 50%. This compression fracture is not well outlined
due to severe osteopenia.
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
## 2014-11-06 ENCOUNTER — Encounter: Payer: Self-pay | Admitting: Psychiatry

## 2014-11-06 ENCOUNTER — Ambulatory Visit (INDEPENDENT_AMBULATORY_CARE_PROVIDER_SITE_OTHER): Payer: BLUE CROSS/BLUE SHIELD | Admitting: Psychiatry

## 2014-11-06 VITALS — BP 118/72 | HR 98 | Temp 98.0°F | Ht 62.0 in | Wt 187.2 lb

## 2014-11-06 DIAGNOSIS — Z630 Problems in relationship with spouse or partner: Secondary | ICD-10-CM

## 2014-11-06 DIAGNOSIS — G4700 Insomnia, unspecified: Secondary | ICD-10-CM

## 2014-11-06 DIAGNOSIS — F331 Major depressive disorder, recurrent, moderate: Secondary | ICD-10-CM | POA: Diagnosis not present

## 2014-11-06 DIAGNOSIS — F411 Generalized anxiety disorder: Secondary | ICD-10-CM | POA: Diagnosis not present

## 2014-11-06 MED ORDER — CLONAZEPAM 1 MG PO TABS: 1.0000 mg | ORAL_TABLET | Freq: Two times a day (BID) | ORAL | Status: DC | PRN

## 2014-11-06 MED ORDER — DULOXETINE HCL 60 MG PO CPEP: 60.0000 mg | ORAL_CAPSULE | Freq: Every day | ORAL | Status: DC

## 2014-11-06 MED ORDER — ZOLPIDEM TARTRATE 10 MG PO TABS: 10.0000 mg | ORAL_TABLET | Freq: Every evening | ORAL | Status: DC | PRN

## 2014-11-06 NOTE — Progress Notes (Signed)
[facility] MD Progress Note  11/06/2014 1:25 PM Sonya [name]  MRN:  [id number] Subjective:  Follow-up for this patient followed for her depression and anxiety as well as sleep problems. Patient is having more conflict in her marriage. She is seriously considering moving to [place] to be with her family and leaving her husband. Feels sad about it but she feels it's the best decision. She is having trouble communicating with her husband. Mood still has intermittent periods of depression but is improved. No suicidal thoughts. Sleeping well with current medicine no signs of any side effects. Reviewed medical history. Pain is under reasonable control off of all narcotics. Principal Problem: @PPROB @ Diagnosis:  Major depression recurrent moderate, generalized anxiety disorder, insomnia, marital conflict Patient Active Problem List   Diagnosis Date Noted  . Gastroesophageal reflux disease without esophagitis [K21.9] 05/24/2014  . Venous stasis [I87.8] 05/24/2014  . Chronic pain syndrome [G89.4] 05/24/2014  . 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 Avitaminosis D [E55.9] 07/04/2013  . Other screening mammogram [Z12.31] 05/20/2013  . Osteoarthritis of both knees [M17.0] 02/09/2012  . Glucose intolerance (impaired glucose tolerance) [R73.02] 11/10/2011  . Allergic rhinitis [J30.9] 11/10/2011  . 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 COPD (chronic obstructive pulmonary disease) [J44.9] 11/10/2011  . Hypothyroidism [E03.9] 11/10/2011  . Osteopenia [M85.80] 11/10/2011  . HYPERLIPIDEMIA [E78.5] 11/13/2009  . FATIGUE / MALAISE [R53.81, R53.83] 11/13/2009  . Edema [R60.9] 11/13/2009  . DYSPNEA [R06.02] 11/13/2009  . CHEST PAIN-UNSPECIFIED [R07.9] 11/13/2009   Total Time spent with patient: 20 minutes   Past Medical History:  Past Medical History  Diagnosis Date  . HLD (hyperlipidemia)   . Emphysema   .  Anxiety   . Arthritis   . GERD (gastroesophageal reflux disease)   . Anemia   . Hiatal hernia   . UTI (urinary tract infection)     frequent  . Unspecified hypothyroidism   . Hyperglycemia   . Neurotic excoriations   . Squamous cell carcinoma   . Depressive disorder, not elsewhere classified   . Bruising   . Benign neoplasm of colon   . Degeneration of lumbar or lumbosacral intervertebral disc   . Personal history of urinary calculi   . Scoliosis (and kyphoscoliosis), idiopathic   . Tobacco use disorder   . Osteoporosis, unspecified   . Insomnia, unspecified   . Edema   . Pure hypercholesterolemia   . Chronic pain syndrome   . Lumbosacral spondylosis without myelopathy   . Allergic rhinitis, cause unspecified   . Hematuria, unspecified   . Unspecified vitamin D deficiency   . Prurigo nodularis   . Pneumonia     Past Surgical History  Procedure Laterality Date  . Vaginal hysterectomy  1990  . 2 harrington rods in back  1980  . Tonsillectomy and adenoidectomy  1964  . Cervical spine surgery  [place]  . Lumbar spine surgery  [place]  . Squamous cell carcinoma resection  02/2010    [name]  . Bunionectomy  2006    both feet  . Joint replacement  03/16/12    L TKR; R TKR  . Abdominal hysterectomy      endometriosis; ovaries resected; no cancer.   Family History:  Family History  Problem Relation Age of Onset  . Coronary artery disease Brother     stents    SIster stents also  . Heart disease Brother   .  Heart failure Brother     CABG  AMI @ 55  . Heart disease Brother   . Heart failure Mother     pacemaker  . Heart disease Mother     pacemaker; CHF  . Hypertension Mother   . Heart failure Father     AMI  . Cancer Father 85    colon cancer  . Heart disease Father     mutliple AMIs  . GI problems      Family history malignancy, GI tract  . Cancer Maternal Grandmother     ovarian  . Heart disease Sister 64    cardiac stenting  .  Heart disease Brother   . Heart disease Brother    Social History:  History  Alcohol Use No     History  Drug Use No    Social History   Social History  . Marital Status: Married    Spouse Name: Cledith Kamiya  . Number of Children: 1  . Years of Education: 12   Occupational History  . disabled     27   DDD   Social History Main Topics  . Smoking status: Former Smoker -- 1.00 packs/day for 25 years    Types: Cigarettes    Start date: 11/05/1984    Quit date: 11/06/2006  . Smokeless tobacco: Never Used     Comment: quit 11/07  . Alcohol Use: No  . Drug Use: No  . Sexual Activity:    Partners: Male   Other Topics Concern  . None   Social History Narrative   Marital status:  Married since 1992; second marriage; happily married; no abuse.      Children:  1 daughter; 3 stepchildren; 8 grandchildren; 2 gg.   Daughter in [place].  Husband's children in [place].      Employment:  Retired/disabled due to DDD lumbar.       Lives:   Lives with spouse;      Caffeine use cosumes a moderate amount 1 serving per day.      Always uses seat belts.       No guns in home.       Smoke alarm and carbon monoxide detectors in the home.       No Living Will.   Additional History:    Sleep: Fair  Appetite:  Good   Assessment: stable. Depression symptoms under control mostly with good tolerance of current medication. Anxiety controlled for the most part. Sleeping well. Did some counseling and discussion around her marital problems. She appears to be dealing with it in a mature manner.  Musculoskeletal: Strength & Muscle Tone: within normal limits Gait & Station: normal Patient leans: N/A   Psychiatric Specialty Exam: Physical Exam  ROS  Blood pressure 118/72, pulse 98, temperature 98 F (36.7 C), temperature source Tympanic, height 5\' 2"  (1.575 m), weight 84.913 kg (187 lb 3.2 oz), SpO2 96 %.Body mass index is 34.23 kg/(m^2).  General Appearance: Casual and Fairly Groomed  Chemical engineer::  Good  Speech:  Clear and Coherent  Volume:  Normal  Mood:  Euthymic  Affect:  Congruent  Thought Process:  Goal Directed  Orientation:  Full (Time, Place, and Person)  Thought Content:  Negative  Suicidal Thoughts:  No  Homicidal Thoughts:  No  Memory:  Immediate;   Good Recent;   Good Remote;   Good  Judgement:  Intact  Insight:  Present  Psychomotor Activity:  Normal  Concentration:  Good  Recall:  Good  Fund of Knowledge:Good  Language: Good  Akathisia:  No  Handed:  Right  AIMS (if indicated):     Assets:  Communication Skills Desire for Improvement Financial Resources/Insurance Resilience Social Support  ADL's:  Intact  Cognition: WNL  Sleep:        Current Medications: Current Outpatient Prescriptions  Medication Sig Dispense Refill  . albuterol (PROVENTIL HFA;VENTOLIN HFA) 108 (90 BASE) MCG/ACT inhaler Inhale 2 puffs into the lungs every 6 (six) hours as needed for wheezing (cough, shortness of breath or wheezing.). 3 Inhaler 3  . azelastine (OPTIVAR) 0.05 % ophthalmic solution Place 1 drop into both eyes 2 (two) times daily. 6 mL 12  . baclofen (LIORESAL) 10 MG tablet Take 10 mg by mouth 2 (two) times daily as needed.  2  . clonazePAM (KLONOPIN) 1 MG tablet Take 1 tablet (1 mg total) by mouth 2 (two) times daily as needed. 180 tablet 1  . doxycycline (VIBRA-TABS) 100 MG tablet   4  . DULoxetine (CYMBALTA) 60 MG capsule Take 1 capsule (60 mg total) by mouth daily. 90 capsule 1  . esomeprazole (NEXIUM) 40 MG capsule Take 1 capsule (40 mg total) by mouth daily. 90 capsule 3  . fexofenadine (ALLEGRA) 180 MG tablet Take 1 tablet (180 mg total) by mouth daily. 90 tablet 3  . fluticasone (FLONASE) 50 MCG/ACT nasal spray Place 2 sprays into both nostrils daily. 16 g 11  . furosemide (LASIX) 40 MG tablet Take 1 tablet (40 mg total) by mouth daily. 90 tablet 3  . gabapentin (NEURONTIN) 300 MG capsule Take 300 mg by mouth 3 (three) times daily.  3  .  levothyroxine (SYNTHROID, LEVOTHROID) 88 MCG tablet Take 1 tablet (88 mcg total) by mouth daily. 90 tablet 3  . nitroGLYCERIN (NITROSTAT) 0.4 MG SL tablet Place 1 tablet (0.4 mg total) under the tongue every 5 (five) minutes as needed. 25 tablet 6  . OXYGEN-HELIUM IN Inhale 2 L into the lungs at bedtime.    . potassium chloride SA (K-DUR,KLOR-CON) 20 MEQ tablet Take 1 tablet (20 mEq total) by mouth daily. 90 tablet 3  . ranitidine (ZANTAC) 150 MG tablet Take 1 tablet (150 mg total) by mouth at bedtime. 30 tablet 2  . rosuvastatin (CRESTOR) 20 MG tablet Take 1 tablet (20 mg total) by mouth daily. 90 tablet 3  . traMADol (ULTRAM) 50 MG tablet TAKE 2 TABLETS BY MOUTH 4 TIMES DAILY AS NEEDED  1  . tretinoin (RETIN-A) 0.1 % cream Apply a pea sized amount to the face nightly 20 min after washing, avoiding area around the eyes and creases around the nose.    . zolpidem (AMBIEN) 10 MG tablet Take 1 tablet (10 mg total) by mouth at bedtime as needed for sleep. 90 tablet 1  . zoster vaccine live, PF, (ZOSTAVAX) 12751 UNT/0.65ML injection Inject 19,400 Units into the skin once. 0.65 mL 0   No current facility-administered medications for this visit.    Lab Results: No results found for this or any previous visit (from the past 48 hour(s)).  Physical Findings: AIMS:  , ,  ,  ,    CIWA:    COWS:     Treatment Plan Summary: Medication management and Plan refilled the clonazepam twice a day and Ambien 10 mg at night as needed for sleep. After discussion with her it appears that she has only been taking 60 mg of Cymbalta and not the 90 that I was believing she was taking.  She does not wish to increase the dose. We will continue the Cymbalta at 60 mg a day. Supportive counseling completed. Reviewed medication plan. Follow-up 6 months   Medical Decision Making:  Established Problem, Stable/Improving (1), Review of Psycho-Social Stressors (1), Review of Medication Regimen & Side Effects (2) and Review of New  Medication or Change in Dosage (2)     John Clapacs 11/06/2014, 1:25 PM

## 2014-11-14 ENCOUNTER — Ambulatory Visit: Payer: Self-pay | Admitting: Psychiatry

## 2014-11-29 ENCOUNTER — Ambulatory Visit: Payer: BLUE CROSS/BLUE SHIELD | Admitting: Family Medicine

## 2014-12-01 ENCOUNTER — Ambulatory Visit (INDEPENDENT_AMBULATORY_CARE_PROVIDER_SITE_OTHER): Payer: BLUE CROSS/BLUE SHIELD | Admitting: Family Medicine

## 2014-12-01 ENCOUNTER — Encounter: Payer: Self-pay | Admitting: Family Medicine

## 2014-12-01 VITALS — BP 130/85 | HR 103 | Temp 97.9°F | Resp 20 | Wt 186.6 lb

## 2014-12-01 DIAGNOSIS — Z114 Encounter for screening for human immunodeficiency virus [HIV]: Secondary | ICD-10-CM

## 2014-12-01 DIAGNOSIS — I878 Other specified disorders of veins: Secondary | ICD-10-CM | POA: Diagnosis not present

## 2014-12-01 DIAGNOSIS — G4710 Hypersomnia, unspecified: Secondary | ICD-10-CM | POA: Diagnosis not present

## 2014-12-01 DIAGNOSIS — J449 Chronic obstructive pulmonary disease, unspecified: Secondary | ICD-10-CM

## 2014-12-01 DIAGNOSIS — Z23 Encounter for immunization: Secondary | ICD-10-CM | POA: Diagnosis not present

## 2014-12-01 DIAGNOSIS — K219 Gastro-esophageal reflux disease without esophagitis: Secondary | ICD-10-CM

## 2014-12-01 DIAGNOSIS — M8580 Other specified disorders of bone density and structure, unspecified site: Secondary | ICD-10-CM | POA: Diagnosis not present

## 2014-12-01 DIAGNOSIS — E034 Atrophy of thyroid (acquired): Secondary | ICD-10-CM

## 2014-12-01 DIAGNOSIS — E038 Other specified hypothyroidism: Secondary | ICD-10-CM | POA: Diagnosis not present

## 2014-12-01 DIAGNOSIS — E785 Hyperlipidemia, unspecified: Secondary | ICD-10-CM | POA: Diagnosis not present

## 2014-12-01 DIAGNOSIS — R7302 Impaired glucose tolerance (oral): Secondary | ICD-10-CM | POA: Diagnosis not present

## 2014-12-01 DIAGNOSIS — Z1231 Encounter for screening mammogram for malignant neoplasm of breast: Secondary | ICD-10-CM | POA: Diagnosis not present

## 2014-12-01 DIAGNOSIS — M47814 Spondylosis without myelopathy or radiculopathy, thoracic region: Secondary | ICD-10-CM

## 2014-12-01 DIAGNOSIS — Z1159 Encounter for screening for other viral diseases: Secondary | ICD-10-CM | POA: Diagnosis not present

## 2014-12-01 DIAGNOSIS — J301 Allergic rhinitis due to pollen: Secondary | ICD-10-CM

## 2014-12-01 DIAGNOSIS — G894 Chronic pain syndrome: Secondary | ICD-10-CM

## 2014-12-01 DIAGNOSIS — M47816 Spondylosis without myelopathy or radiculopathy, lumbar region: Secondary | ICD-10-CM

## 2014-12-01 LAB — CBC WITH DIFFERENTIAL/PLATELET
BASOS ABS: 0 10*3/uL (ref 0.0–0.1)
Basophils Relative: 0 % (ref 0–1)
Eosinophils Absolute: 0.1 10*3/uL (ref 0.0–0.7)
Eosinophils Relative: 1 % (ref 0–5)
HEMATOCRIT: 46.4 % — AB (ref 36.0–46.0)
HEMOGLOBIN: 15.3 g/dL — AB (ref 12.0–15.0)
LYMPHS PCT: 18 % (ref 12–46)
Lymphs Abs: 1.5 10*3/uL (ref 0.7–4.0)
MCH: 28.5 pg (ref 26.0–34.0)
MCHC: 33 g/dL (ref 30.0–36.0)
MCV: 86.4 fL (ref 78.0–100.0)
MPV: 9.1 fL (ref 8.6–12.4)
Monocytes Absolute: 0.5 10*3/uL (ref 0.1–1.0)
Monocytes Relative: 6 % (ref 3–12)
NEUTROS ABS: 6.2 10*3/uL (ref 1.7–7.7)
Neutrophils Relative %: 75 % (ref 43–77)
Platelets: 256 10*3/uL (ref 150–400)
RBC: 5.37 MIL/uL — AB (ref 3.87–5.11)
RDW: 15.1 % (ref 11.5–15.5)
WBC: 8.3 10*3/uL (ref 4.0–10.5)

## 2014-12-01 LAB — LIPID PANEL
CHOL/HDL RATIO: 3.8 ratio (ref <=5.0)
Cholesterol: 197 mg/dL (ref 125–200)
HDL: 52 mg/dL (ref >=46)
LDL CALC: 117 mg/dL (ref <130)
Triglycerides: 138 mg/dL (ref <150)
VLDL: 28 mg/dL (ref <30)

## 2014-12-01 LAB — COMPREHENSIVE METABOLIC PANEL
ALBUMIN: 4.6 g/dL (ref 3.6–5.1)
ALT: 22 U/L (ref 6–29)
AST: 24 U/L (ref 10–35)
Alkaline Phosphatase: 105 U/L (ref 33–130)
BILIRUBIN TOTAL: 0.4 mg/dL (ref 0.2–1.2)
BUN: 10 mg/dL (ref 7–25)
CO2: 32 mmol/L — AB (ref 20–31)
CREATININE: 0.79 mg/dL (ref 0.50–0.99)
Calcium: 9 mg/dL (ref 8.6–10.4)
Chloride: 99 mmol/L (ref 98–110)
GLUCOSE: 95 mg/dL (ref 65–99)
Potassium: 4.6 mmol/L (ref 3.5–5.3)
SODIUM: 139 mmol/L (ref 135–146)
Total Protein: 7.3 g/dL (ref 6.1–8.1)

## 2014-12-01 LAB — HEMOGLOBIN A1C
Hgb A1c MFr Bld: 6 % — ABNORMAL HIGH (ref <5.7)
Mean Plasma Glucose: 126 mg/dL — ABNORMAL HIGH (ref <117)

## 2014-12-01 NOTE — Progress Notes (Signed)
Subjective:    Patient ID: Sonya [name], female    DOB: [birth year], 64 y.o.   MRN: [id number]  12/01/2014  Hyperlipidemia and Hypothyroidism   HPI This 64 y.o. female presents for six month follow-up:  1. Hyperlipidemia: Patient reports good compliance with medication, good tolerance to medication, and good symptom control.    2.  Glucose intolerance: continues to gain weight.  Not exercising.    3.  Venous stasis: table; wearing compression stockings with driving and with flying.  Patient reports good compliance with medication, good tolerance to medication, and good symptom control.    4.  Osteopenia:  Due for bone density scan today.  S/p Boniva in the past.    5.  GERD: Patient reports good compliance with medication, good tolerance to medication, and good symptom control.    6. Chronic pain syndrome:  Much better; has an implant in L lower back. Permanent implant; performed a trial first for seven days and then placed a permanent.  Very helpful with pain.  Currently on zero narcotics.  Now only taking Tramadol 2 tablets four times per day.  Taking Neurontin scheduled.  Two days per week did suffer with acutely worsening pain.  Flare ups are dependent on weather and activity level.  Went to sister's in [place]; before that, had driven to [place].  Flew to [place].  The rod that is broken is still in place; did not effect the implant at all.  Did wear compression stockings with legs.  Seeing pain specialist every 2-3 months.  Dr. Renie Ora.  Spivey placed stimulator.  Trial implant placed in the office.  Implant placed in hospital setting in [place].  Has not started participating in [place] and walking; but hopes to.    7. Neurotic excoriations: cannot go out in humid and hot weather; skin does not tolerate it.  Improved; follow-up with dermatology next week.  8.  Anxiety and depression:  Was planning to leave to [place] without husband; all of children in [place].   Husband's children and family are in [place].  Major marital strain.  Third great grandchild to be born soon. Followed by psychiatry for anxiety and depression.  Granddaughter on bedrest now.  Excessive worry regarding grandchildren and gg.  Not sure when gg baby is due.  January due date as of now.  Going to have BTL.  Going up at Christmas.  Husband not very happy about this but "it is what it is"  Has not been home for Christmas.  Followed every four months.  No therapist/counselor.  Wish had a therapist.  Saw a therapist prior to stimulator placed.  Has card and welcomed patient to make an appointment.    9. Excessive sleepiness/hypersomnolence: wanting to sleep all the time.  S/p sleep study two years ago per Dr. Raul Del; borderline results; no CPAP.  Worried about thyroid contributing.   10. COPD: s/p sleep study; wearing oxygen at night.  Albuterol rarely; once every two weeks.    Review of Systems  Constitutional: Negative for fever, chills, diaphoresis and fatigue.  HENT: Negative for congestion, postnasal drip and rhinorrhea.   Eyes: Negative for visual disturbance.  Respiratory: Negative for cough and shortness of breath.   Cardiovascular: Negative for chest pain, palpitations and leg swelling.  Gastrointestinal: Negative for nausea, vomiting, abdominal pain, diarrhea and constipation.  Endocrine: Negative for cold intolerance, heat intolerance, polydipsia, polyphagia and polyuria.  Musculoskeletal: Positive for back pain.  Skin: Positive for rash.  Neurological: Negative for dizziness,  tremors, seizures, syncope, facial asymmetry, speech difficulty, weakness, light-headedness, numbness and headaches.  Psychiatric/Behavioral: Positive for sleep disturbance and dysphoric mood. Negative for suicidal ideas and self-injury. The patient is nervous/anxious.     Past Medical History  Diagnosis Date  . HLD (hyperlipidemia)   . Emphysema   . Anxiety   . Arthritis   . GERD (gastroesophageal  reflux disease)   . Anemia   . Hiatal hernia   . UTI (urinary tract infection)     frequent  . Unspecified hypothyroidism   . Hyperglycemia   . Neurotic excoriations   . Squamous cell carcinoma ([place])   . Depressive disorder, not elsewhere classified   . Bruising   . Benign neoplasm of colon   . Degeneration of lumbar or lumbosacral intervertebral disc   . Personal history of urinary calculi   . Scoliosis (and kyphoscoliosis), idiopathic   . Tobacco use disorder   . Osteoporosis, unspecified   . Insomnia, unspecified   . Edema   . Pure hypercholesterolemia   . Chronic pain syndrome   . Lumbosacral spondylosis without myelopathy   . Allergic rhinitis, cause unspecified   . Hematuria, unspecified   . Unspecified vitamin D deficiency   . Prurigo nodularis   . Pneumonia    Past Surgical History  Procedure Laterality Date  . Vaginal hysterectomy  1990  . 2 harrington rods in back  1980  . Tonsillectomy and adenoidectomy  1964  . Cervical spine surgery  [place]  . Lumbar spine surgery  [place]  . Squamous cell carcinoma resection  02/2010    [name]  . Bunionectomy  2006    both feet  . Joint replacement  03/16/12    L TKR; R TKR  . Abdominal hysterectomy      endometriosis; ovaries resected; no cancer.   Allergies  Allergen Reactions  . Latex Other (See Comments)    OPEN SORES  . Levofloxacin   . Sulfonamide Derivatives    Current Outpatient Prescriptions  Medication Sig Dispense Refill  . albuterol (PROVENTIL HFA;VENTOLIN HFA) 108 (90 BASE) MCG/ACT inhaler Inhale 2 puffs into the lungs every 6 (six) hours as needed for wheezing (cough, shortness of breath or wheezing.). 3 Inhaler 3  . azelastine (OPTIVAR) 0.05 % ophthalmic solution Place 1 drop into both eyes 2 (two) times daily. 6 mL 12  . baclofen (LIORESAL) 10 MG tablet Take 10 mg by mouth 2 (two) times daily as needed.  2  . clonazePAM (KLONOPIN) 1 MG tablet Take 1 tablet (1 mg total) by  mouth 2 (two) times daily as needed. 180 tablet 1  . DULoxetine (CYMBALTA) 60 MG capsule Take 1 capsule (60 mg total) by mouth daily. 90 capsule 1  . esomeprazole (NEXIUM) 40 MG capsule Take 1 capsule (40 mg total) by mouth daily. 90 capsule 3  . fexofenadine (ALLEGRA) 180 MG tablet Take 1 tablet (180 mg total) by mouth daily. 90 tablet 3  . fluticasone (FLONASE) 50 MCG/ACT nasal spray Place 2 sprays into both nostrils daily. 16 g 11  . furosemide (LASIX) 40 MG tablet Take 1 tablet (40 mg total) by mouth daily. 90 tablet 3  . gabapentin (NEURONTIN) 300 MG capsule Take 300 mg by mouth 3 (three) times daily.  3  . levothyroxine (SYNTHROID, LEVOTHROID) 88 MCG tablet Take 1 tablet (88 mcg total) by mouth daily. 90 tablet 3  . nitroGLYCERIN (NITROSTAT) 0.4 MG SL tablet Place 1 tablet (0.4 mg  total) under the tongue every 5 (five) minutes as needed. 25 tablet 6  . OXYGEN-HELIUM IN Inhale 2 L into the lungs at bedtime.    . potassium chloride SA (K-DUR,KLOR-CON) 20 MEQ tablet Take 1 tablet (20 mEq total) by mouth daily. 90 tablet 3  . ranitidine (ZANTAC) 150 MG tablet Take 1 tablet (150 mg total) by mouth at bedtime. 30 tablet 2  . rosuvastatin (CRESTOR) 20 MG tablet Take 1 tablet (20 mg total) by mouth daily. 90 tablet 3  . traMADol (ULTRAM) 50 MG tablet TAKE 2 TABLETS BY MOUTH 4 TIMES DAILY AS NEEDED  1  . tretinoin (RETIN-A) 0.1 % cream Apply a pea sized amount to the face nightly 20 min after washing, avoiding area around the eyes and creases around the nose.    . zolpidem (AMBIEN) 10 MG tablet Take 1 tablet (10 mg total) by mouth at bedtime as needed for sleep. 90 tablet 1  . zoster vaccine live, PF, (ZOSTAVAX) 95284 UNT/0.65ML injection Inject 19,400 Units into the skin once. 0.65 mL 0   No current facility-administered medications for this visit.   Social History   Social History  . Marital Status: Married    Spouse Name: Rexann Lueras  . Number of Children: 1  . Years of Education: 12    Occupational History  . disabled     33   DDD   Social History Main Topics  . Smoking status: Former Smoker -- 1.00 packs/day for 25 years    Types: Cigarettes    Start date: 11/05/1984    Quit date: 11/06/2006  . Smokeless tobacco: Never Used     Comment: quit 11/07  . Alcohol Use: No  . Drug Use: No  . Sexual Activity:    Partners: Male   Other Topics Concern  . Not on file   Social History Narrative   Marital status:  Married since 1992; second marriage; happily married; no abuse.      Children:  1 daughter; 3 stepchildren; 8 grandchildren; 2 gg.   Daughter in [place].  Husband's children in [place].      Employment:  Retired/disabled due to DDD lumbar.       Lives:   Lives with spouse;      Caffeine use cosumes a moderate amount 1 serving per day.      Always uses seat belts.       No guns in home.       Smoke alarm and carbon monoxide detectors in the home.       No Living Will.   Family History  Problem Relation Age of Onset  . Coronary artery disease Brother     stents    SIster stents also  . Heart disease Brother   . Heart failure Brother     CABG  AMI @ 59  . Heart disease Brother   . Heart failure Mother     pacemaker  . Heart disease Mother     pacemaker; CHF  . Hypertension Mother   . Heart failure Father     AMI  . Cancer Father 76    colon cancer  . Heart disease Father     mutliple AMIs  . GI problems      Family history malignancy, GI tract  . Cancer Maternal Grandmother     ovarian  . Heart disease Sister 90    cardiac stenting  . Heart disease Brother   . Heart disease Brother  Objective:    BP 130/85 mmHg  Pulse 103  Temp(Src) 97.9 F (36.6 C) (Oral)  Resp 20  Wt 186 lb 9.6 oz (84.641 kg) Physical Exam  Constitutional: She is oriented to person, place, and time. She appears well-developed and well-nourished. No distress.  HENT:  Head: Normocephalic and atraumatic.  Right Ear: External ear normal.  Left Ear: External  ear normal.  Nose: Nose normal.  Mouth/Throat: Oropharynx is clear and moist.  Eyes: Conjunctivae and EOM are normal. Pupils are equal, round, and reactive to light.  Neck: Normal range of motion. Neck supple. Carotid bruit is not present. No thyromegaly present.  Cardiovascular: Normal rate, regular rhythm, normal heart sounds and intact distal pulses.  Exam reveals no gallop and no friction rub.   No murmur heard. Pulmonary/Chest: Effort normal and breath sounds normal. She has no wheezes. She has no rales.  Abdominal: Soft. Bowel sounds are normal. She exhibits no distension and no mass. There is no tenderness. There is no rebound and no guarding.  Lymphadenopathy:    She has no cervical adenopathy.  Neurological: She is alert and oriented to person, place, and time. No cranial nerve deficit.  Skin: Skin is warm and dry. No rash noted. She is not diaphoretic. No erythema. No pallor.  +excoriations B anterior legs with bandaid in place.  Psychiatric: She has a normal mood and affect. Her behavior is normal.  Less anxious today.   Results for orders placed or performed in visit on 05/24/14  CBC with Differential/Platelet  Result Value Ref Range   WBC 11.2 (H) 4.0 - 10.5 K/uL   RBC 5.43 (H) 3.87 - 5.11 MIL/uL   Hemoglobin 15.5 (H) 12.0 - 15.0 g/dL   HCT 46.6 (H) 36.0 - 46.0 %   MCV 85.8 78.0 - 100.0 fL   MCH 28.5 26.0 - 34.0 pg   MCHC 33.3 30.0 - 36.0 g/dL   RDW 14.5 11.5 - 15.5 %   Platelets 324 150 - 400 K/uL   MPV 9.0 8.6 - 12.4 fL   Neutrophils Relative % 77 43 - 77 %   Neutro Abs 8.6 (H) 1.7 - 7.7 K/uL   Lymphocytes Relative 15 12 - 46 %   Lymphs Abs 1.7 0.7 - 4.0 K/uL   Monocytes Relative 7 3 - 12 %   Monocytes Absolute 0.8 0.1 - 1.0 K/uL   Eosinophils Relative 1 0 - 5 %   Eosinophils Absolute 0.1 0.0 - 0.7 K/uL   Basophils Relative 0 0 - 1 %   Basophils Absolute 0.0 0.0 - 0.1 K/uL   Smear Review Criteria for review not met   Comprehensive metabolic panel  Result Value  Ref Range   Sodium 140 135 - 145 mEq/L   Potassium 3.7 3.5 - 5.3 mEq/L   Chloride 97 96 - 112 mEq/L   CO2 26 19 - 32 mEq/L   Glucose, Bld 96 70 - 99 mg/dL   BUN 10 6 - 23 mg/dL   Creat 0.71 0.50 - 1.10 mg/dL   Total Bilirubin 0.7 0.2 - 1.2 mg/dL   Alkaline Phosphatase 113 39 - 117 U/L   AST 25 0 - 37 U/L   ALT 22 0 - 35 U/L   Total Protein 7.7 6.0 - 8.3 g/dL   Albumin 4.7 3.5 - 5.2 g/dL   Calcium 10.4 8.4 - 10.5 mg/dL  Hemoglobin A1c  Result Value Ref Range   Hgb A1c MFr Bld 5.9 (H) <5.7 %   Mean  Plasma Glucose 123 (H) <117 mg/dL  Lipid panel  Result Value Ref Range   Cholesterol 163 0 - 200 mg/dL   Triglycerides 173 (H) <150 mg/dL   HDL 38 (L) >=46 mg/dL   Total CHOL/HDL Ratio 4.3 Ratio   VLDL 35 0 - 40 mg/dL   LDL Cholesterol 90 0 - 99 mg/dL  TSH  Result Value Ref Range   TSH 0.268 (L) 0.350 - 4.500 uIU/mL  T4, free  Result Value Ref Range   Free T4 1.57 0.80 - 1.80 ng/dL  POCT urinalysis dipstick  Result Value Ref Range   Color, UA yellow    Clarity, UA clear    Glucose, UA neg    Bilirubin, UA neg    Ketones, UA neg    Spec Grav, UA 1.010    Blood, UA neg    pH, UA 7.0    Protein, UA neg    Urobilinogen, UA 0.2    Nitrite, UA neg    Leukocytes, UA Negative    INFLUENZA VACCINE ADMINISTERED.    Assessment & Plan:   1. Hyperlipidemia   2. Glucose intolerance (impaired glucose tolerance)   3. Need for prophylactic vaccination and inoculation against influenza   4. Venous stasis   5. Osteopenia   6. Hypothyroidism due to acquired atrophy of thyroid   7. Gastroesophageal reflux disease without esophagitis   8. Hypersomnolence   9. Encounter for screening mammogram for breast cancer   10. Need for hepatitis C screening test   11. Screening for HIV (human immunodeficiency virus)   12. Degenerative arthritis of thoracic spine   13. Spondylosis of lumbar region without myelopathy or radiculopathy   14. Chronic obstructive pulmonary disease, unspecified COPD  type ([place])   15. Chronic pain syndrome   16. Allergic rhinitis due to pollen      1. Hyperlipidemia: controlled; obtain labs; continue Crestor. 2.  Glucose intolerance: stable; obtain labs; recommend weight loss, exercise, low-sugar and low-carbohydrate food choices. 3.  Venous stasis: stable; continue Lasix PRN; continue compression stockings; obtain labs. 4.  Osteopenia; stable; refer for bone density scan; s/p Boniva for > five years; continue calcium; recommend daily exercise weight bearing. 5. Hypothyroidism: over-corrected at last visit; repeat labs today; continue Synthroid at current dose. 6.  GERD: Controlled; continue PPI. 7. S/p flu vaccine. 8. Anxiety and depression: appears improved; having marital strain but coping well. 9. 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  Managed by pain management. 10. COPD; stable.   11. Hypersomnolence: New.  Consider repeat sleep study; obtain labs. Encourage regular exercise. 12.  Screening Hepatitis C: obtain Hepatitis C ab in [place]. 13.  Screening HIV; obtain HIV per CDC guidelines.    Orders Placed This Encounter  Procedures  . MM Digital Screening    Standing Status: Future     Number of Occurrences:      Standing Expiration Date: 01/31/2016    Order Specific Question:  Reason for Exam (SYMPTOM  OR DIAGNOSIS REQUIRED)    Answer:  osteopenia    Order Specific Question:  Preferred imaging location?    Answer:  [facility]  . DG Bone Density    Standing Status: Future     Number of Occurrences:      Standing Expiration Date: 01/31/2016    Order Specific Question:  Reason for Exam (SYMPTOM  OR DIAGNOSIS REQUIRED)  Answer:  osteopenia    Order Specific Question:  Preferred imaging location?    Answer:  [facility]  . Flu Vaccine QUAD 36+ mos IM  . Comprehensive metabolic panel    Order  Specific Question:  Has the patient fasted?    Answer:  Yes  . Hemoglobin A1c  . Lipid panel    Order Specific Question:  Has the patient fasted?    Answer:  Yes  . T4, free  . TSH  . CBC with Differential/Platelet  . HIV antibody  . Hepatitis C antibody   No orders of the defined types were placed in this encounter.    Return in about 6 months (around 06/01/2015) for complete physical examiniation.    Kiara Mcdowell Elayne Guerin, M.D. [facility] [address] [place], [place]  [address] [phone] phone [phone] fax

## 2014-12-01 NOTE — Patient Instructions (Signed)
We recommend that you schedule a mammogram for breast cancer screening. Typically, you do not need a referral to do this. Please contact a local imaging center to schedule your mammogram.  [facility] - [phone]  *ask for the Radiology Department [facility]) - [phone] or [phone]  [facility] - [phone] [facility] - [phone] [facility]  - [phone]  *ask for the Radiology Department [facility] - [phone]  *ask for the Radiology Department [facility] - [phone]  *ask for the Mammography Department [facility] - [phone] 

## 2014-12-02 LAB — T4, FREE: Free T4: 1.23 ng/dL (ref 0.80–1.80)

## 2014-12-02 LAB — TSH: TSH: 1.171 u[IU]/mL (ref 0.350–4.500)

## 2014-12-02 LAB — HIV ANTIBODY (ROUTINE TESTING W REFLEX): HIV 1&2 Ab, 4th Generation: NONREACTIVE

## 2014-12-02 LAB — HEPATITIS C ANTIBODY: HCV Ab: NEGATIVE

## 2014-12-05 ENCOUNTER — Other Ambulatory Visit: Payer: Self-pay

## 2014-12-05 DIAGNOSIS — Z1231 Encounter for screening mammogram for malignant neoplasm of breast: Secondary | ICD-10-CM

## 2014-12-05 DIAGNOSIS — E2839 Other primary ovarian failure: Secondary | ICD-10-CM

## 2014-12-29 ENCOUNTER — Other Ambulatory Visit: Payer: Self-pay | Admitting: Anesthesiology

## 2014-12-29 ENCOUNTER — Ambulatory Visit
Admission: RE | Admit: 2014-12-29 | Discharge: 2014-12-29 | Disposition: A | Discharge status: Home / Self Care | Payer: BLUE CROSS/BLUE SHIELD | Source: Ambulatory Visit | Attending: Anesthesiology | Admitting: Anesthesiology

## 2014-12-29 DIAGNOSIS — M545 Low back pain: Secondary | ICD-10-CM

## 2015-01-03 ENCOUNTER — Ambulatory Visit
Admission: RE | Admit: 2015-01-03 | Discharge: 2015-01-03 | Disposition: A | Discharge status: Home / Self Care | Payer: BLUE CROSS/BLUE SHIELD | Source: Ambulatory Visit | Attending: Family Medicine | Admitting: Family Medicine

## 2015-01-03 DIAGNOSIS — E2839 Other primary ovarian failure: Secondary | ICD-10-CM | POA: Insufficient documentation

## 2015-01-03 DIAGNOSIS — Z780 Asymptomatic menopausal state: Secondary | ICD-10-CM | POA: Diagnosis not present

## 2015-01-03 DIAGNOSIS — Z1231 Encounter for screening mammogram for malignant neoplasm of breast: Secondary | ICD-10-CM | POA: Diagnosis not present

## 2015-01-08 ENCOUNTER — Other Ambulatory Visit: Payer: Self-pay | Admitting: Family Medicine

## 2015-01-08 DIAGNOSIS — R928 Other abnormal and inconclusive findings on diagnostic imaging of breast: Secondary | ICD-10-CM

## 2015-01-10 ENCOUNTER — Ambulatory Visit
Admission: RE | Admit: 2015-01-10 | Discharge: 2015-01-10 | Disposition: A | Discharge status: Home / Self Care | Payer: BLUE CROSS/BLUE SHIELD | Source: Ambulatory Visit | Attending: Family Medicine | Admitting: Family Medicine

## 2015-01-10 DIAGNOSIS — R928 Other abnormal and inconclusive findings on diagnostic imaging of breast: Secondary | ICD-10-CM | POA: Insufficient documentation

## 2015-01-11 ENCOUNTER — Other Ambulatory Visit: Payer: Self-pay

## 2015-01-11 ENCOUNTER — Telehealth: Payer: Self-pay

## 2015-03-26 ENCOUNTER — Telehealth: Payer: Self-pay | Admitting: Family Medicine

## 2015-03-26 NOTE — Telephone Encounter (Signed)
lmom of new appt date with Dr Tamala Julian she has moved all of her appt to Tuesdays now

## 2015-05-03 ENCOUNTER — Ambulatory Visit (INDEPENDENT_AMBULATORY_CARE_PROVIDER_SITE_OTHER): Payer: BLUE CROSS/BLUE SHIELD | Admitting: Psychiatry

## 2015-05-03 ENCOUNTER — Encounter: Payer: Self-pay | Admitting: Psychiatry

## 2015-05-03 VITALS — BP 120/82 | HR 88 | Temp 98.3°F | Ht 62.0 in | Wt 196.4 lb

## 2015-05-03 DIAGNOSIS — F411 Generalized anxiety disorder: Secondary | ICD-10-CM

## 2015-05-03 DIAGNOSIS — F331 Major depressive disorder, recurrent, moderate: Secondary | ICD-10-CM | POA: Diagnosis not present

## 2015-05-03 DIAGNOSIS — G4700 Insomnia, unspecified: Secondary | ICD-10-CM

## 2015-05-03 MED ORDER — ZOLPIDEM TARTRATE 10 MG PO TABS
10.0000 mg | ORAL_TABLET | Freq: Every evening | ORAL | Status: DC | PRN
Start: 2015-05-03 — End: 2015-05-03

## 2015-05-03 MED ORDER — DULOXETINE HCL 60 MG PO CPEP: 60.0000 mg | ORAL_CAPSULE | Freq: Every day | ORAL | Status: DC

## 2015-05-03 MED ORDER — CLONAZEPAM 1 MG PO TABS
1.0000 mg | ORAL_TABLET | Freq: Two times a day (BID) | ORAL | Status: DC | PRN
Start: 2015-05-03 — End: 2015-09-14

## 2015-05-03 MED ORDER — CLONAZEPAM 1 MG PO TABS
1.0000 mg | ORAL_TABLET | Freq: Two times a day (BID) | ORAL | Status: DC | PRN
Start: 2015-05-03 — End: 2015-05-03

## 2015-05-03 MED ORDER — ZOLPIDEM TARTRATE 10 MG PO TABS: 10.0000 mg | ORAL_TABLET | Freq: Every evening | ORAL | Status: DC | PRN

## 2015-05-03 NOTE — Progress Notes (Signed)
[facility] MD Progress Note  05/03/2015 1:28 PM Sonya [name]  MRN:  [id number] Subjective:  Follow-up for this 65 year old woman with chronic anxiety and depression. She reports she has been feeling emotionally fine the last few months. No return of major depression. Anxiety generally under good control. Seems to of improved her coping skills. She has had some more pain problems and is now going to a pain clinic. She has a stimulator and is taking chronic narcotics. She does not feel over sedated. We did make a special effort to discuss the potential risks of taking sedating medicines along with narcotics. Patient does not seem to be abusing them or getting delirious or oversedated. Principal Problem: @PPROB @ Diagnosis:   Patient Active Problem List   Diagnosis Date Noted  . Gastroesophageal reflux disease without esophagitis [K21.9] 05/24/2014  . Venous stasis [I87.8] 05/24/2014  . Chronic pain syndrome [G89.4] 05/24/2014  . 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 Avitaminosis D [E55.9] 07/04/2013  . Other screening mammogram [Z12.31] 05/20/2013  . Osteoarthritis of both knees [M17.0] 02/09/2012  . Glucose intolerance (impaired glucose tolerance) [R73.02] 11/10/2011  . Allergic rhinitis [J30.9] 11/10/2011  . 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 COPD (chronic obstructive pulmonary disease) ([place]) [J44.9] 11/10/2011  . Hypothyroidism [E03.9] 11/10/2011  . Osteopenia [M85.80] 11/10/2011  . HYPERLIPIDEMIA [E78.5] 11/13/2009  . FATIGUE / MALAISE [R53.81, R53.83] 11/13/2009  . Edema [R60.9] 11/13/2009  . DYSPNEA [R06.02] 11/13/2009  . CHEST PAIN-UNSPECIFIED [R07.9] 11/13/2009   Total Time spent with patient: 20 minutes  Past Psychiatric History: Past history of chronic depression and anxiety currently tolerating medicine well.  Past Medical History:  Past Medical History  Diagnosis Date  . HLD  (hyperlipidemia)   . Emphysema   . Anxiety   . Arthritis   . GERD (gastroesophageal reflux disease)   . Anemia   . Hiatal hernia   . UTI (urinary tract infection)     frequent  . Unspecified hypothyroidism   . Hyperglycemia   . Neurotic excoriations   . Squamous cell carcinoma ([place])   . Depressive disorder, not elsewhere classified   . Bruising   . Benign neoplasm of colon   . Degeneration of lumbar or lumbosacral intervertebral disc   . Personal history of urinary calculi   . Scoliosis (and kyphoscoliosis), idiopathic   . Tobacco use disorder   . Osteoporosis, unspecified   . Insomnia, unspecified   . Edema   . Pure hypercholesterolemia   . Chronic pain syndrome   . Lumbosacral spondylosis without myelopathy   . Allergic rhinitis, cause unspecified   . Hematuria, unspecified   . Unspecified vitamin D deficiency   . Prurigo nodularis   . Pneumonia     Past Surgical History  Procedure Laterality Date  . Vaginal hysterectomy  1990  . 2 harrington rods in back  1980  . Tonsillectomy and adenoidectomy  1964  . Cervical spine surgery  [place]  . Lumbar spine surgery  [place]  . Squamous cell carcinoma resection  02/2010    Sonya [name]  . Bunionectomy  2006    both feet  . Joint replacement  03/16/12    L TKR; R TKR  . Abdominal hysterectomy      endometriosis; ovaries resected; no cancer.   Family History:  Family History  Problem Relation Age of Onset  . Coronary artery disease Brother     stents  SIster stents also  . Heart disease Brother   . Heart failure Brother     CABG  AMI @ 47  . Heart disease Brother   . Heart failure Mother     pacemaker  . Heart disease Mother     pacemaker; CHF  . Hypertension Mother   . Heart failure Father     AMI  . Cancer Father 32    colon cancer  . Heart disease Father     mutliple AMIs  . GI problems      Family history malignancy, GI tract  . Cancer Maternal Grandmother     ovarian  . Heart  disease Sister 26    cardiac stenting  . Heart disease Brother   . Heart disease Brother   . Breast cancer Cousin    Family Psychiatric  History: Family history of anxiety Social History:  History  Alcohol Use No     History  Drug Use No    Social History   Social History  . Marital Status: Married    Spouse Name: Sonya [name]  . Number of Children: 1  . Years of Education: 12   Occupational History  . disabled     103   DDD   Social History Main Topics  . Smoking status: Former Smoker -- 1.00 packs/day for 25 years    Types: Cigarettes    Start date: 11/05/1984    Quit date: 11/06/2006  . Smokeless tobacco: Never Used     Comment: quit 11/07  . Alcohol Use: No  . Drug Use: No  . Sexual Activity:    Partners: Male   Other Topics Concern  . None   Social History Narrative   Marital status:  Married since 1992; second marriage; happily married; no abuse.      Children:  1 daughter; 3 stepchildren; 8 grandchildren; 2 gg.   Daughter in [place].  Husband's children in [place].      Employment:  Retired/disabled due to DDD lumbar.       Lives:   Lives with spouse;      Caffeine use cosumes a moderate amount 1 serving per day.      Always uses seat belts.       No guns in home.       Smoke alarm and carbon monoxide detectors in the home.       No Living Will.   Additional Social History:                         Sleep: Good  Appetite:  Good  Current Medications: Current Outpatient Prescriptions  Medication Sig Dispense Refill  . albuterol (PROVENTIL HFA;VENTOLIN HFA) 108 (90 BASE) MCG/ACT inhaler Inhale 2 puffs into the lungs every 6 (six) hours as needed for wheezing (cough, shortness of breath or wheezing.). 3 Inhaler 3  . AMITIZA 24 MCG capsule TAKE 1 CAPSULE BY MOUTH TWICE DAILY AS NEEDED  2  . azelastine (OPTIVAR) 0.05 % ophthalmic solution Place 1 drop into both eyes 2 (two) times daily. 6 mL 12  . baclofen (LIORESAL) 10 MG tablet Take 10 mg by  mouth 2 (two) times daily as needed.  2  . clonazePAM (KLONOPIN) 1 MG tablet Take 1 tablet (1 mg total) by mouth 2 (two) times daily as needed. 180 tablet 1  . DULoxetine (CYMBALTA) 60 MG capsule Take 1 capsule (60 mg total) by mouth daily. 90 capsule 1  .  esomeprazole (NEXIUM) 40 MG capsule Take 1 capsule (40 mg total) by mouth daily. 90 capsule 3  . fexofenadine (ALLEGRA) 180 MG tablet Take 1 tablet (180 mg total) by mouth daily. 90 tablet 3  . fluocinonide cream (LIDEX) 0.05 % APPLY TO AFFECTED AREA TWICE A DAY  2  . fluticasone (FLONASE) 50 MCG/ACT nasal spray Place 2 sprays into both nostrils daily. 16 g 11  . furosemide (LASIX) 40 MG tablet Take 1 tablet (40 mg total) by mouth daily. 90 tablet 3  . gabapentin (NEURONTIN) 300 MG capsule Take 300 mg by mouth 3 (three) times daily.  3  . gabapentin (NEURONTIN) 400 MG capsule TAKE ONE CAPSULE BY MOUTH 4 TIMES A DAY  2  . levothyroxine (SYNTHROID, LEVOTHROID) 88 MCG tablet Take 1 tablet (88 mcg total) by mouth daily. 90 tablet 3  . nitroGLYCERIN (NITROSTAT) 0.4 MG SL tablet Place 1 tablet (0.4 mg total) under the tongue every 5 (five) minutes as needed. 25 tablet 6  . NUCYNTA 100 MG TABS TAKE 1 TABLET BY MOUTH 4 TIMES A DAY AS NEEDED  0  . OXYGEN-HELIUM IN Inhale 2 L into the lungs at bedtime.    . potassium chloride SA (K-DUR,KLOR-CON) 20 MEQ tablet Take 1 tablet (20 mEq total) by mouth daily. 90 tablet 3  . PULMICORT FLEXHALER 180 MCG/ACT inhaler INHALE 1 INHALATION INTO THE LUNGS 2 (TWO) TIMES DAILY.  6  . ranitidine (ZANTAC) 150 MG tablet Take 1 tablet (150 mg total) by mouth at bedtime. 30 tablet 2  . rosuvastatin (CRESTOR) 20 MG tablet Take 1 tablet (20 mg total) by mouth daily. 90 tablet 3  . traMADol (ULTRAM) 50 MG tablet TAKE 2 TABLETS BY MOUTH 4 TIMES DAILY AS NEEDED  1  . tretinoin (RETIN-A) 0.1 % cream Apply a pea sized amount to the face nightly 20 min after washing, avoiding area around the eyes and creases around the nose.    .  zolpidem (AMBIEN) 10 MG tablet Take 1 tablet (10 mg total) by mouth at bedtime as needed for sleep. 90 tablet 1  . zoster vaccine live, PF, (ZOSTAVAX) 09811 UNT/0.65ML injection Inject 19,400 Units into the skin once. 0.65 mL 0   No current facility-administered medications for this visit.    Lab Results: No results found for this or any previous visit (from the past 48 hour(s)).  Blood Alcohol level:  No results found for: [facility]  Physical Findings: AIMS:  , ,  ,  ,    CIWA:    COWS:     Musculoskeletal: Strength & Muscle Tone: within normal limits Gait & Station: normal Patient leans: N/A  Psychiatric Specialty Exam: ROS  Blood pressure 120/82, pulse 88, temperature 98.3 F (36.8 C), temperature source Tympanic, height 5\' 2"  (1.575 m), weight 196 lb 6.4 oz (89.086 kg), SpO2 91 %.Body mass index is 35.91 kg/(m^2).  General Appearance: Fairly Groomed  Engineer, water::  Good  Speech:  Clear and Coherent  Volume:  Normal  Mood:  Euthymic  Affect:  Congruent  Thought Process:  Goal Directed  Orientation:  Full (Time, Place, and Person)  Thought Content:  Negative  Suicidal Thoughts:  No  Homicidal Thoughts:  No  Memory:  Immediate;   Good Recent;   Good Remote;   Good  Judgement:  Good  Insight:  Good  Psychomotor Activity:  Normal  Concentration:  Good  Recall:  Good  Fund of Knowledge:Good  Language: Good  Akathisia:  No  Handed:  Right  AIMS (if indicated):     Assets:  Communication Skills Desire for Improvement Financial Resources/Insurance Housing Resilience Social Support  ADL's:  Impaired  Cognition: WNL  Sleep:      Treatment Plan Summary: Medication management and Plan refill prescriptions for clonazepam Ambien and Cymbalta. Once again we discussed the potential risks. Patient is not on particularly high doses and does not seem to be impaired. Supportive counseling and encouragement in her continuing to take care of herself. Follow-up 6 months.  Alethia Berthold, MD 05/03/2015, 1:28 PM

## 2015-05-11 ENCOUNTER — Telehealth: Payer: Self-pay

## 2015-05-11 NOTE — Telephone Encounter (Signed)
two rx was faxed and confirmed ;  ambien 10mg  id # [id number] order # [id number]  and klonopin 1mg   id # [id number] order # [id number]

## 2015-05-25 ENCOUNTER — Encounter: Payer: BLUE CROSS/BLUE SHIELD | Admitting: Family Medicine

## 2015-06-07 ENCOUNTER — Other Ambulatory Visit: Payer: Self-pay | Admitting: Oncology

## 2015-06-07 DIAGNOSIS — L281 Prurigo nodularis: Secondary | ICD-10-CM

## 2015-06-12 ENCOUNTER — Encounter: Payer: BLUE CROSS/BLUE SHIELD | Admitting: Family Medicine

## 2015-06-21 ENCOUNTER — Ambulatory Visit
Admission: RE | Admit: 2015-06-21 | Discharge: 2015-06-21 | Disposition: A | Discharge status: Home / Self Care | Payer: BLUE CROSS/BLUE SHIELD | Source: Ambulatory Visit | Attending: Oncology | Admitting: Oncology

## 2015-06-21 DIAGNOSIS — L281 Prurigo nodularis: Secondary | ICD-10-CM | POA: Insufficient documentation

## 2015-06-21 DIAGNOSIS — J9811 Atelectasis: Secondary | ICD-10-CM | POA: Insufficient documentation

## 2015-06-21 DIAGNOSIS — X58XXXA Exposure to other specified factors, initial encounter: Secondary | ICD-10-CM | POA: Diagnosis not present

## 2015-06-21 DIAGNOSIS — T84216A Breakdown (mechanical) of internal fixation device of vertebrae, initial encounter: Secondary | ICD-10-CM | POA: Diagnosis not present

## 2015-07-06 NOTE — Telephone Encounter (Signed)
close

## 2015-09-09 ENCOUNTER — Emergency Department (HOSPITAL_COMMUNITY): Payer: BLUE CROSS/BLUE SHIELD

## 2015-09-09 ENCOUNTER — Inpatient Hospital Stay (HOSPITAL_COMMUNITY)
Admission: EM | Admit: 2015-09-09 | Discharge: 2015-09-14 | DRG: 917 | Disposition: A | Discharge status: Home Health | Payer: BLUE CROSS/BLUE SHIELD | Attending: Internal Medicine | Admitting: Internal Medicine

## 2015-09-09 ENCOUNTER — Encounter (HOSPITAL_COMMUNITY): Payer: Self-pay | Admitting: Emergency Medicine

## 2015-09-09 DIAGNOSIS — D126 Benign neoplasm of colon, unspecified: Secondary | ICD-10-CM | POA: Diagnosis present

## 2015-09-09 DIAGNOSIS — I340 Nonrheumatic mitral (valve) insufficiency: Secondary | ICD-10-CM | POA: Diagnosis present

## 2015-09-09 DIAGNOSIS — Z8249 Family history of ischemic heart disease and other diseases of the circulatory system: Secondary | ICD-10-CM

## 2015-09-09 DIAGNOSIS — Z87442 Personal history of urinary calculi: Secondary | ICD-10-CM

## 2015-09-09 DIAGNOSIS — N179 Acute kidney failure, unspecified: Secondary | ICD-10-CM | POA: Diagnosis present

## 2015-09-09 DIAGNOSIS — T402X1A Poisoning by other opioids, accidental (unintentional), initial encounter: Secondary | ICD-10-CM | POA: Diagnosis not present

## 2015-09-09 DIAGNOSIS — J9621 Acute and chronic respiratory failure with hypoxia: Secondary | ICD-10-CM | POA: Diagnosis present

## 2015-09-09 DIAGNOSIS — J9601 Acute respiratory failure with hypoxia: Secondary | ICD-10-CM | POA: Diagnosis present

## 2015-09-09 DIAGNOSIS — G894 Chronic pain syndrome: Secondary | ICD-10-CM | POA: Diagnosis present

## 2015-09-09 DIAGNOSIS — R4182 Altered mental status, unspecified: Secondary | ICD-10-CM | POA: Diagnosis not present

## 2015-09-09 DIAGNOSIS — I272 Other secondary pulmonary hypertension: Secondary | ICD-10-CM | POA: Diagnosis present

## 2015-09-09 DIAGNOSIS — Z79899 Other long term (current) drug therapy: Secondary | ICD-10-CM

## 2015-09-09 DIAGNOSIS — E7800 Pure hypercholesterolemia, unspecified: Secondary | ICD-10-CM | POA: Diagnosis present

## 2015-09-09 DIAGNOSIS — G9340 Encephalopathy, unspecified: Secondary | ICD-10-CM

## 2015-09-09 DIAGNOSIS — G4733 Obstructive sleep apnea (adult) (pediatric): Secondary | ICD-10-CM | POA: Diagnosis present

## 2015-09-09 DIAGNOSIS — E785 Hyperlipidemia, unspecified: Secondary | ICD-10-CM | POA: Diagnosis present

## 2015-09-09 DIAGNOSIS — I5032 Chronic diastolic (congestive) heart failure: Secondary | ICD-10-CM | POA: Diagnosis present

## 2015-09-09 DIAGNOSIS — Z79891 Long term (current) use of opiate analgesic: Secondary | ICD-10-CM

## 2015-09-09 DIAGNOSIS — M4120 Other idiopathic scoliosis, site unspecified: Secondary | ICD-10-CM | POA: Diagnosis present

## 2015-09-09 DIAGNOSIS — I110 Hypertensive heart disease with heart failure: Secondary | ICD-10-CM | POA: Diagnosis present

## 2015-09-09 DIAGNOSIS — K7200 Acute and subacute hepatic failure without coma: Secondary | ICD-10-CM | POA: Diagnosis present

## 2015-09-09 DIAGNOSIS — M810 Age-related osteoporosis without current pathological fracture: Secondary | ICD-10-CM | POA: Diagnosis present

## 2015-09-09 DIAGNOSIS — R0602 Shortness of breath: Secondary | ICD-10-CM

## 2015-09-09 DIAGNOSIS — R740 Nonspecific elevation of levels of transaminase and lactic acid dehydrogenase [LDH]: Secondary | ICD-10-CM | POA: Diagnosis present

## 2015-09-09 DIAGNOSIS — M6282 Rhabdomyolysis: Secondary | ICD-10-CM | POA: Diagnosis present

## 2015-09-09 DIAGNOSIS — J189 Pneumonia, unspecified organism: Secondary | ICD-10-CM

## 2015-09-09 DIAGNOSIS — J690 Pneumonitis due to inhalation of food and vomit: Secondary | ICD-10-CM | POA: Diagnosis present

## 2015-09-09 DIAGNOSIS — G92 Toxic encephalopathy: Secondary | ICD-10-CM | POA: Diagnosis present

## 2015-09-09 DIAGNOSIS — Z7951 Long term (current) use of inhaled steroids: Secondary | ICD-10-CM

## 2015-09-09 DIAGNOSIS — L89301 Pressure ulcer of unspecified buttock, stage 1: Secondary | ICD-10-CM | POA: Diagnosis present

## 2015-09-09 DIAGNOSIS — I480 Paroxysmal atrial fibrillation: Secondary | ICD-10-CM | POA: Diagnosis present

## 2015-09-09 DIAGNOSIS — Z96653 Presence of artificial knee joint, bilateral: Secondary | ICD-10-CM | POA: Diagnosis present

## 2015-09-09 DIAGNOSIS — R748 Abnormal levels of other serum enzymes: Secondary | ICD-10-CM | POA: Diagnosis present

## 2015-09-09 DIAGNOSIS — E559 Vitamin D deficiency, unspecified: Secondary | ICD-10-CM | POA: Diagnosis present

## 2015-09-09 DIAGNOSIS — L8990 Pressure ulcer of unspecified site, unspecified stage: Secondary | ICD-10-CM | POA: Diagnosis present

## 2015-09-09 DIAGNOSIS — I4891 Unspecified atrial fibrillation: Secondary | ICD-10-CM | POA: Diagnosis present

## 2015-09-09 DIAGNOSIS — D6489 Other specified anemias: Secondary | ICD-10-CM | POA: Diagnosis present

## 2015-09-09 DIAGNOSIS — Z87891 Personal history of nicotine dependence: Secondary | ICD-10-CM

## 2015-09-09 DIAGNOSIS — E039 Hypothyroidism, unspecified: Secondary | ICD-10-CM | POA: Diagnosis present

## 2015-09-09 DIAGNOSIS — J449 Chronic obstructive pulmonary disease, unspecified: Secondary | ICD-10-CM | POA: Diagnosis present

## 2015-09-09 DIAGNOSIS — E876 Hypokalemia: Secondary | ICD-10-CM | POA: Diagnosis present

## 2015-09-09 DIAGNOSIS — E038 Other specified hypothyroidism: Secondary | ICD-10-CM | POA: Diagnosis present

## 2015-09-09 LAB — COMPREHENSIVE METABOLIC PANEL
ALBUMIN: 3.1 g/dL — AB (ref 3.5–5.0)
ALT: 776 U/L — ABNORMAL HIGH (ref 14–54)
ANION GAP: 8 (ref 5–15)
AST: 1558 U/L — ABNORMAL HIGH (ref 15–41)
Alkaline Phosphatase: 69 U/L (ref 38–126)
BILIRUBIN TOTAL: 0.9 mg/dL (ref 0.3–1.2)
BUN: 24 mg/dL — ABNORMAL HIGH (ref 6–20)
CALCIUM: 8.7 mg/dL — AB (ref 8.9–10.3)
CO2: 26 mmol/L (ref 22–32)
Chloride: 105 mmol/L (ref 101–111)
Creatinine, Ser: 1.74 mg/dL — ABNORMAL HIGH (ref 0.44–1.00)
GFR calc non Af Amer: 30 mL/min — ABNORMAL LOW (ref >60)
GFR, EST AFRICAN AMERICAN: 35 mL/min — AB (ref >60)
GLUCOSE: 120 mg/dL — AB (ref 65–99)
POTASSIUM: 4 mmol/L (ref 3.5–5.1)
Sodium: 139 mmol/L (ref 135–145)
TOTAL PROTEIN: 6.8 g/dL (ref 6.5–8.1)

## 2015-09-09 LAB — URINALYSIS, ROUTINE W REFLEX MICROSCOPIC
Bilirubin Urine: NEGATIVE
Glucose, UA: NEGATIVE mg/dL
Ketones, ur: 15 mg/dL — AB
NITRITE: NEGATIVE
PROTEIN: 100 mg/dL — AB
SPECIFIC GRAVITY, URINE: 1.018 (ref 1.005–1.030)
pH: 5.5 (ref 5.0–8.0)

## 2015-09-09 LAB — URINE MICROSCOPIC-ADD ON

## 2015-09-09 LAB — I-STAT ARTERIAL BLOOD GAS, ED
ACID-BASE EXCESS: 4 mmol/L — AB (ref 0.0–2.0)
BICARBONATE: 27.3 meq/L — AB (ref 20.0–24.0)
O2 SAT: 87 %
PCO2 ART: 37.4 mmHg (ref 35.0–45.0)
PH ART: 7.471 — AB (ref 7.350–7.450)
PO2 ART: 50 mmHg — AB (ref 80.0–100.0)
TCO2: 28 mmol/L (ref 0–100)

## 2015-09-09 LAB — CBC
HEMATOCRIT: 39.9 % (ref 36.0–46.0)
Hemoglobin: 12.5 g/dL (ref 12.0–15.0)
MCH: 28.8 pg (ref 26.0–34.0)
MCHC: 31.3 g/dL (ref 30.0–36.0)
MCV: 91.9 fL (ref 78.0–100.0)
PLATELETS: 272 10*3/uL (ref 150–400)
RBC: 4.34 MIL/uL (ref 3.87–5.11)
RDW: 15.3 % (ref 11.5–15.5)
WBC: 13.1 10*3/uL — AB (ref 4.0–10.5)

## 2015-09-09 LAB — I-STAT CG4 LACTIC ACID, ED: LACTIC ACID, VENOUS: 2.09 mmol/L — AB (ref 0.5–1.9)

## 2015-09-09 LAB — RAPID URINE DRUG SCREEN, HOSP PERFORMED
Amphetamines: NOT DETECTED
BARBITURATES: NOT DETECTED
BENZODIAZEPINES: POSITIVE — AB
Cocaine: NOT DETECTED
Opiates: POSITIVE — AB
Tetrahydrocannabinol: NOT DETECTED

## 2015-09-09 LAB — ACETAMINOPHEN LEVEL

## 2015-09-09 LAB — I-STAT TROPONIN, ED: TROPONIN I, POC: 0.57 ng/mL — AB (ref 0.00–0.08)

## 2015-09-09 LAB — AMMONIA: Ammonia: 18 umol/L (ref 9–35)

## 2015-09-09 LAB — ETHANOL: Alcohol, Ethyl (B): <5 mg/dL (ref <5)

## 2015-09-09 LAB — CBG MONITORING, ED: GLUCOSE-CAPILLARY: 104 mg/dL — AB (ref 65–99)

## 2015-09-09 LAB — BRAIN NATRIURETIC PEPTIDE: B NATRIURETIC PEPTIDE 5: 462.1 pg/mL — AB (ref 0.0–100.0)

## 2015-09-09 LAB — SALICYLATE LEVEL

## 2015-09-09 MED ORDER — ACETAMINOPHEN 325 MG RE SUPP
325.0000 mg | Freq: Once | RECTAL | Status: AC
  Administered 2015-09-09: 325 mg via RECTAL
  Filled 2015-09-09: qty 1

## 2015-09-09 MED ORDER — DEXTROSE 5 % IV SOLN
500.0000 mg | Freq: Once | INTRAVENOUS | Status: AC
  Administered 2015-09-09: 500 mg via INTRAVENOUS
  Filled 2015-09-09: qty 500

## 2015-09-09 MED ORDER — SODIUM CHLORIDE 0.9 % IV BOLUS (SEPSIS)
1000.0000 mL | Freq: Once | INTRAVENOUS | Status: AC
  Administered 2015-09-09: 1000 mL via INTRAVENOUS

## 2015-09-09 MED ORDER — ASPIRIN EC 325 MG PO TBEC: 325.0000 mg | DELAYED_RELEASE_TABLET | Freq: Once | ORAL | Status: DC

## 2015-09-09 MED ORDER — DEXTROSE 5 % IV SOLN
1.0000 g | Freq: Once | INTRAVENOUS | Status: AC
  Administered 2015-09-09: 1 g via INTRAVENOUS
  Filled 2015-09-09: qty 10

## 2015-09-09 MED ORDER — ALBUTEROL (5 MG/ML) CONTINUOUS INHALATION SOLN
10.0000 mg/h | INHALATION_SOLUTION | Freq: Once | RESPIRATORY_TRACT | Status: AC
  Administered 2015-09-09: 10 mg/h via RESPIRATORY_TRACT
  Filled 2015-09-09: qty 20

## 2015-09-09 MED ORDER — NALOXONE HCL 2 MG/2ML IJ SOSY
0.8000 mg | PREFILLED_SYRINGE | Freq: Once | INTRAMUSCULAR | Status: AC
  Administered 2015-09-09: 0.8 mg via INTRAVENOUS

## 2015-09-09 MED ORDER — ASPIRIN 300 MG RE SUPP
300.0000 mg | Freq: Once | RECTAL | Status: AC
  Administered 2015-09-09: 300 mg via RECTAL
  Filled 2015-09-09: qty 1

## 2015-09-09 NOTE — ED Provider Notes (Signed)
CSN: [id number]     Arrival date & time 09/09/15  2028 History   First MD Initiated Contact with Patient 09/09/15 2043     Chief Complaint  Patient presents with  . Altered Mental Status     (Consider location/radiation/quality/duration/timing/severity/associated sxs/prior Treatment) HPI Patient unable to provide history due to mental status change. History provided by EMS.  Since a 65 year old female presenting with 3 days worsening mental status. EMS reports family noticed today at 31 AM the patient was having a difficult time answering questions and following instructions. She's been more sleepy than usual all day. She does not have any current complaints at this time and denies chest pain, shortness of breath. He was given 1 mg of Narcan en route per EMS with "partial responsiveness" but on arrival is again mentating poorly.   Past Medical History  Diagnosis Date  . HLD (hyperlipidemia)   . Emphysema   . Anxiety   . Arthritis   . GERD (gastroesophageal reflux disease)   . Anemia   . Hiatal hernia   . UTI (urinary tract infection)     frequent  . Unspecified hypothyroidism   . Hyperglycemia   . Neurotic excoriations   . Squamous cell carcinoma ([place])   . Depressive disorder, not elsewhere classified   . Bruising   . Benign neoplasm of colon   . Degeneration of lumbar or lumbosacral intervertebral disc   . Personal history of urinary calculi   . Scoliosis (and kyphoscoliosis), idiopathic   . Tobacco use disorder   . Osteoporosis, unspecified   . Insomnia, unspecified   . Edema   . Pure hypercholesterolemia   . Chronic pain syndrome   . Lumbosacral spondylosis without myelopathy   . Allergic rhinitis, cause unspecified   . Hematuria, unspecified   . Unspecified vitamin D deficiency   . Prurigo nodularis   . Pneumonia    Past Surgical History  Procedure Laterality Date  . Vaginal hysterectomy  1990  . 2 harrington rods in back  1980  . Tonsillectomy and  adenoidectomy  1964  . Cervical spine surgery  [place]  . Lumbar spine surgery  [place]  . Squamous cell carcinoma resection  02/2010    [name]  . Bunionectomy  2006    both feet  . Joint replacement  03/16/12    L TKR; R TKR  . Abdominal hysterectomy      endometriosis; ovaries resected; no cancer.   Family History  Problem Relation Age of Onset  . Coronary artery disease Brother     stents    SIster stents also  . Heart disease Brother   . Heart failure Brother     CABG  AMI @ 64  . Heart disease Brother   . Heart failure Mother     pacemaker  . Heart disease Mother     pacemaker; CHF  . Hypertension Mother   . Heart failure Father     AMI  . Cancer Father 67    colon cancer  . Heart disease Father     mutliple AMIs  . GI problems      Family history malignancy, GI tract  . Cancer Maternal Grandmother     ovarian  . Heart disease Sister 57    cardiac stenting  . Heart disease Brother   . Heart disease Brother   . Breast cancer Cousin    Social History  Substance Use Topics  . Smoking  status: Former Smoker -- 1.00 packs/day for 25 years    Types: Cigarettes    Start date: 11/05/1984    Quit date: 11/06/2006  . Smokeless tobacco: Never Used     Comment: quit 11/07  . Alcohol Use: No   OB History    No data available     Review of Systems  Unable to perform ROS: Mental status change      Allergies  Latex; Levofloxacin; and Sulfonamide derivatives  Home Medications   Prior to Admission medications   Medication Sig Start Date End Date Taking? Authorizing Provider  albuterol (PROVENTIL HFA;VENTOLIN HFA) 108 (90 BASE) MCG/ACT inhaler Inhale 2 puffs into the lungs every 6 (six) hours as needed for wheezing (cough, shortness of breath or wheezing.). 06/14/12  Yes Wardell Honour, MD  AMITIZA 24 MCG capsule TAKE 1 CAPSULE BY MOUTH TWICE DAILY AS NEEDED 04/27/15  Yes Historical Provider, MD  baclofen (LIORESAL) 10 MG tablet Take 10 mg by  mouth 2 (two) times daily.  10/22/14  Yes Historical Provider, MD  budesonide (PULMICORT FLEXHALER) 180 MCG/ACT inhaler Inhale 1 puff into the lungs 2 (two) times daily. 07/12/15 07/11/16 Yes Historical Provider, MD  clonazePAM (KLONOPIN) 1 MG tablet Take 1 tablet (1 mg total) by mouth 2 (two) times daily as needed. Patient taking differently: Take 1 mg by mouth 2 (two) times daily.  05/03/15  Yes Gonzella Lex, MD  DULoxetine (CYMBALTA) 60 MG capsule Take 1 capsule (60 mg total) by mouth daily. 05/03/15  Yes Gonzella Lex, MD  esomeprazole (NEXIUM) 40 MG capsule Take 1 capsule (40 mg total) by mouth daily. 05/24/14  Yes Wardell Honour, MD  fexofenadine (ALLEGRA) 180 MG tablet Take 1 tablet (180 mg total) by mouth daily. 05/24/14  Yes Wardell Honour, MD  furosemide (LASIX) 40 MG tablet Take 1 tablet (40 mg total) by mouth daily. 05/24/14  Yes Wardell Honour, MD  gabapentin (NEURONTIN) 300 MG capsule Take 300 mg by mouth 3 (three) times daily. 10/16/14  Yes Historical Provider, MD  levothyroxine (SYNTHROID, LEVOTHROID) 88 MCG tablet Take 1 tablet (88 mcg total) by mouth daily. Patient taking differently: Take 88 mcg by mouth daily before breakfast.  05/24/14  Yes Wardell Honour, MD  montelukast (SINGULAIR) 10 MG tablet Take 10 mg by mouth at bedtime. 09/04/15  Yes Historical Provider, MD  morphine (MS CONTIN) 60 MG 12 hr tablet Take 60 mg by mouth every 8 (eight) hours.   Yes Historical Provider, MD  nitroGLYCERIN (NITROSTAT) 0.4 MG SL tablet Place 1 tablet (0.4 mg total) under the tongue every 5 (five) minutes as needed. 05/10/13  Yes Minna Merritts, MD  NUCYNTA 100 MG TABS TAKE 1 TABLET BY MOUTH three times a day 04/06/15  Yes Historical Provider, MD  OXYGEN Inhale 2 L into the lungs at bedtime.   Yes Historical Provider, MD  potassium chloride SA (K-DUR,KLOR-CON) 20 MEQ tablet Take 1 tablet (20 mEq total) by mouth daily. 05/24/14  Yes Wardell Honour, MD  PULMICORT FLEXHALER 180 MCG/ACT inhaler INHALE 1  INHALATION INTO THE LUNGS 2 (TWO) TIMES DAILY. 04/03/15  Yes Historical Provider, MD  ranitidine (ZANTAC) 150 MG tablet Take 1 tablet (150 mg total) by mouth at bedtime. Patient taking differently: Take 150 mg by mouth daily as needed for heartburn.  10/26/13  Yes Wardell Honour, MD  rosuvastatin (CRESTOR) 20 MG tablet Take 1 tablet (20 mg total) by mouth daily. 06/23/14  Yes Renette Butters  Tamala Julian, MD  tretinoin (RETIN-A) 0.1 % cream Apply a pea sized amount to the face nightly 20 min after washing, avoiding area around the eyes and creases around the nose. 01/25/14  Yes Historical Provider, MD  zolpidem (AMBIEN) 10 MG tablet Take 1 tablet (10 mg total) by mouth at bedtime as needed for sleep. 05/03/15  Yes Gonzella Lex, MD   BP 104/67 mmHg  Pulse 80  Temp(Src) 103 F (39.4 C) (Rectal)  Resp 15  Wt 90.992 kg  SpO2 90% Physical Exam  Constitutional: She appears well-developed and well-nourished. She appears lethargic.  HENT:  Head: Normocephalic and atraumatic.  Eyes: Conjunctivae are normal.  Pupils 3 mm and reactive bilaterally  Cardiovascular: Normal rate and normal heart sounds.   No murmur heard. Pulmonary/Chest: Effort normal.  Diminished right more than left  Abdominal: Soft. There is no tenderness. There is no rebound.  Musculoskeletal: Edema: 1+ bilaterally in the lower extremities.  Neurological: She appears lethargic. No cranial nerve deficit. GCS eye subscore is 3. GCS verbal subscore is 4. GCS motor subscore is 4.  Skin: Skin is warm. She is not diaphoretic.  Nursing note and vitals reviewed.   ED Course  Procedures (including critical care time) Labs Review Labs Reviewed  COMPREHENSIVE METABOLIC PANEL - Abnormal; Notable for the following:    Glucose, Bld 120 (*)    BUN 24 (*)    Creatinine, Ser 1.74 (*)    Calcium 8.7 (*)    Albumin 3.1 (*)    AST 1558 (*)    ALT 776 (*)    GFR calc non Af Amer 30 (*)    GFR calc Af Amer 35 (*)    All other components within normal  limits  BRAIN NATRIURETIC PEPTIDE - Abnormal; Notable for the following:    B Natriuretic Peptide 462.1 (*)    All other components within normal limits  URINE RAPID DRUG SCREEN, HOSP PERFORMED - Abnormal; Notable for the following:    Opiates POSITIVE (*)    Benzodiazepines POSITIVE (*)    All other components within normal limits  URINALYSIS, ROUTINE W REFLEX MICROSCOPIC (NOT AT [facility]) - Abnormal; Notable for the following:    APPearance CLOUDY (*)    Hgb urine dipstick LARGE (*)    Ketones, ur 15 (*)    Protein, ur 100 (*)    Leukocytes, UA SMALL (*)    All other components within normal limits  ACETAMINOPHEN LEVEL - Abnormal; Notable for the following:    Acetaminophen (Tylenol), Serum <10 (*)    All other components within normal limits  CBC - Abnormal; Notable for the following:    WBC 13.1 (*)    All other components within normal limits  TSH - Abnormal; Notable for the following:    TSH 0.238 (*)    All other components within normal limits  URINE MICROSCOPIC-ADD ON - Abnormal; Notable for the following:    Squamous Epithelial / LPF 6-30 (*)    Bacteria, UA FEW (*)    Casts HYALINE CASTS (*)    All other components within normal limits  CBG MONITORING, ED - Abnormal; Notable for the following:    Glucose-Capillary 104 (*)    All other components within normal limits  I-STAT CG4 LACTIC ACID, ED - Abnormal; Notable for the following:    Lactic Acid, Venous 2.09 (*)    All other components within normal limits  I-STAT TROPOININ, ED - Abnormal; Notable for the following:    Troponin  i, poc 0.57 (*)    All other components within normal limits  I-STAT ARTERIAL BLOOD GAS, ED - Abnormal; Notable for the following:    pH, Arterial 7.471 (*)    pO2, Arterial 50.0 (*)    Bicarbonate 27.3 (*)    Acid-Base Excess 4.0 (*)    All other components within normal limits  CULTURE, BLOOD (ROUTINE X 2)  CULTURE, BLOOD (ROUTINE X 2)  SALICYLATE LEVEL  ETHANOL  AMMONIA  BLOOD GAS,  ARTERIAL  LIPASE, BLOOD  MAGNESIUM  PHOSPHORUS  TROPONIN I  TROPONIN I  TROPONIN I  PROTIME-INR  APTT  CBC  BASIC METABOLIC PANEL  MAGNESIUM  PHOSPHORUS  STREP PNEUMONIAE URINARY ANTIGEN  LEGIONELLA PNEUMOPHILA SEROGP 1 UR AG  I-STAT CG4 LACTIC ACID, ED    Imaging Review Ct Head Wo Contrast  09/09/2015  CLINICAL DATA:  65 year old female with altered mental status. New prescription for morphine. Initial encounter. EXAM: CT HEAD WITHOUT CONTRAST TECHNIQUE: Contiguous axial images were obtained from the base of the skull through the vertex without intravenous contrast. COMPARISON:  None. FINDINGS: No intracranial hemorrhage or CT evidence of large acute infarct. No intracranial mass lesion noted on this unenhanced exam. No hydrocephalus. Partial opacification left mastoid air cells. No acute orbital abnormality. IMPRESSION: No intracranial hemorrhage or CT evidence of large acute infarct. Minimal partial opacification left mastoid air cells. Electronically Signed   By: Genia Del M.D.   On: 09/09/2015 23:12   Dg Chest Portable 1 View  09/09/2015  CLINICAL DATA:  Altered mental status. EXAM: PORTABLE CHEST 1 VIEW COMPARISON:  None. FINDINGS: Mild cardiomegaly noted. Right lung airspace disease is identified. There is no evidence of pneumothorax. Left basilar first atelectasis versus scarring noted. Thoracic surgical hardware (right rod fracture at the T11 level) and probable cervical neurostimulator noted. IMPRESSION: Diffuse right lung airspace disease -question pneumonia/aspiration. Electronically Signed   By: Margarette Canada M.D.   On: 09/09/2015 21:47   I have personally reviewed and evaluated these images and lab results as part of my medical decision-making.   EKG Interpretation   Date/Time:  Sunday September 09 2015 20:54:51 EDT Ventricular Rate:  92 PR Interval:    QRS Duration: 97 QT Interval:  361 QTC Calculation: 447 R Axis:   -58 Text Interpretation:  Sinus rhythm Left axis  deviation No significant  change since last tracing Confirmed by Ashok Cordia  MD, Lennette Bihari ([id number]) on  09/09/2015 8:57:22 PM      MDM   Final diagnoses:  None   Miss Caras is a 65 year old female who presented with 3 days gradually worsening altered mental status. On arrival she was difficult to arouse and was found to be severely hypoxic requiring greater than 15 L by nonrebreather. Her oxygen was remained 88-94 in the emergency department and she remained confused and lethargic.  Operatory evaluation significant for acute kidney injury with creatinine 1.67. PH 7.47. PCO2 50. Chest x-ray shows right lung airspace disease possibly pneumonia versus aspiration pneumonitis. CT of the head showed no acute intracranial abnormalities.  Critical care evaluated the patient and will admit. Unsure of etiology of altered mental status at this time. Possibly secondary to new long-acting opioids, although this would be inconsistent with her poor response to Narcan in the field and on arrival to the emergency department. Is also possible she has encephalopathy secondary to sepsis. She was given fluids and started on antibiotics in the emergency department for community-acquired pneumonia. Her airway was well protected while in the emergency  department. She was admitted to the ICU in critical condition.  Allie Bossier, MD 09/10/15 [id number]  Lajean Saver, MD 09/11/15 [id number]

## 2015-09-09 NOTE — ED Notes (Signed)
Pt arrives via EMS from home, EMS reports lethargy since yesterday and only responsive to pain since 0800 this AM. EMS reports new RX for morphine, hx back fusion. Fire reports RA sats at 70%, placed on nonrebreather with sats in low 90s. EMS gave 2MG  of narcan with some repsonse. Pupils PERRLA 3MM.

## 2015-09-10 ENCOUNTER — Emergency Department (HOSPITAL_COMMUNITY): Payer: BLUE CROSS/BLUE SHIELD

## 2015-09-10 ENCOUNTER — Inpatient Hospital Stay (HOSPITAL_COMMUNITY): Payer: BLUE CROSS/BLUE SHIELD

## 2015-09-10 DIAGNOSIS — I4891 Unspecified atrial fibrillation: Secondary | ICD-10-CM | POA: Diagnosis not present

## 2015-09-10 DIAGNOSIS — Z87442 Personal history of urinary calculi: Secondary | ICD-10-CM | POA: Diagnosis not present

## 2015-09-10 DIAGNOSIS — J9601 Acute respiratory failure with hypoxia: Secondary | ICD-10-CM | POA: Diagnosis present

## 2015-09-10 DIAGNOSIS — Z96653 Presence of artificial knee joint, bilateral: Secondary | ICD-10-CM | POA: Diagnosis present

## 2015-09-10 DIAGNOSIS — J690 Pneumonitis due to inhalation of food and vomit: Secondary | ICD-10-CM | POA: Diagnosis present

## 2015-09-10 DIAGNOSIS — Z7951 Long term (current) use of inhaled steroids: Secondary | ICD-10-CM | POA: Diagnosis not present

## 2015-09-10 DIAGNOSIS — T402X1A Poisoning by other opioids, accidental (unintentional), initial encounter: Secondary | ICD-10-CM | POA: Diagnosis present

## 2015-09-10 DIAGNOSIS — G92 Toxic encephalopathy: Secondary | ICD-10-CM | POA: Diagnosis present

## 2015-09-10 DIAGNOSIS — E876 Hypokalemia: Secondary | ICD-10-CM | POA: Diagnosis not present

## 2015-09-10 DIAGNOSIS — E785 Hyperlipidemia, unspecified: Secondary | ICD-10-CM | POA: Diagnosis present

## 2015-09-10 DIAGNOSIS — N179 Acute kidney failure, unspecified: Secondary | ICD-10-CM | POA: Diagnosis present

## 2015-09-10 DIAGNOSIS — R0600 Dyspnea, unspecified: Secondary | ICD-10-CM | POA: Diagnosis not present

## 2015-09-10 DIAGNOSIS — R748 Abnormal levels of other serum enzymes: Secondary | ICD-10-CM | POA: Diagnosis present

## 2015-09-10 DIAGNOSIS — E039 Hypothyroidism, unspecified: Secondary | ICD-10-CM | POA: Diagnosis present

## 2015-09-10 DIAGNOSIS — Z8249 Family history of ischemic heart disease and other diseases of the circulatory system: Secondary | ICD-10-CM | POA: Diagnosis not present

## 2015-09-10 DIAGNOSIS — I272 Other secondary pulmonary hypertension: Secondary | ICD-10-CM | POA: Diagnosis not present

## 2015-09-10 DIAGNOSIS — Z87891 Personal history of nicotine dependence: Secondary | ICD-10-CM | POA: Diagnosis not present

## 2015-09-10 DIAGNOSIS — E7800 Pure hypercholesterolemia, unspecified: Secondary | ICD-10-CM | POA: Diagnosis present

## 2015-09-10 DIAGNOSIS — I480 Paroxysmal atrial fibrillation: Secondary | ICD-10-CM | POA: Diagnosis not present

## 2015-09-10 DIAGNOSIS — I5032 Chronic diastolic (congestive) heart failure: Secondary | ICD-10-CM | POA: Diagnosis present

## 2015-09-10 DIAGNOSIS — G9340 Encephalopathy, unspecified: Secondary | ICD-10-CM | POA: Diagnosis not present

## 2015-09-10 DIAGNOSIS — Z79891 Long term (current) use of opiate analgesic: Secondary | ICD-10-CM | POA: Diagnosis not present

## 2015-09-10 DIAGNOSIS — D126 Benign neoplasm of colon, unspecified: Secondary | ICD-10-CM | POA: Diagnosis present

## 2015-09-10 DIAGNOSIS — M6282 Rhabdomyolysis: Secondary | ICD-10-CM | POA: Diagnosis present

## 2015-09-10 DIAGNOSIS — K7200 Acute and subacute hepatic failure without coma: Secondary | ICD-10-CM | POA: Diagnosis present

## 2015-09-10 DIAGNOSIS — L89301 Pressure ulcer of unspecified buttock, stage 1: Secondary | ICD-10-CM | POA: Diagnosis present

## 2015-09-10 DIAGNOSIS — J449 Chronic obstructive pulmonary disease, unspecified: Secondary | ICD-10-CM | POA: Diagnosis present

## 2015-09-10 DIAGNOSIS — Z79899 Other long term (current) drug therapy: Secondary | ICD-10-CM | POA: Diagnosis not present

## 2015-09-10 DIAGNOSIS — I481 Persistent atrial fibrillation: Secondary | ICD-10-CM | POA: Diagnosis not present

## 2015-09-10 DIAGNOSIS — J9621 Acute and chronic respiratory failure with hypoxia: Secondary | ICD-10-CM | POA: Diagnosis present

## 2015-09-10 DIAGNOSIS — R4182 Altered mental status, unspecified: Secondary | ICD-10-CM | POA: Diagnosis present

## 2015-09-10 DIAGNOSIS — G4733 Obstructive sleep apnea (adult) (pediatric): Secondary | ICD-10-CM | POA: Diagnosis present

## 2015-09-10 LAB — TROPONIN I
TROPONIN I: 0.2 ng/mL — AB (ref <0.03)
Troponin I: 0.38 ng/mL (ref <0.03)
Troponin I: 0.31 ng/mL (ref <0.03)

## 2015-09-10 LAB — BLOOD GAS, ARTERIAL
ACID-BASE EXCESS: 3.1 mmol/L — AB (ref 0.0–2.0)
BICARBONATE: 27 meq/L — AB (ref 20.0–24.0)
DRAWN BY: 33100
FIO2: 1
O2 SAT: 95.9 %
PATIENT TEMPERATURE: 98.3
TCO2: 28.2 mmol/L (ref 0–100)
pCO2 arterial: 39.6 mmHg (ref 35.0–45.0)
pH, Arterial: 7.447 (ref 7.350–7.450)
pO2, Arterial: 78 mmHg — ABNORMAL LOW (ref 80.0–100.0)

## 2015-09-10 LAB — CBC
HEMATOCRIT: 39.9 % (ref 36.0–46.0)
HEMOGLOBIN: 11.4 g/dL — AB (ref 12.0–15.0)
MCH: 26.1 pg (ref 26.0–34.0)
MCHC: 28.6 g/dL — ABNORMAL LOW (ref 30.0–36.0)
MCV: 91.5 fL (ref 78.0–100.0)
Platelets: ADEQUATE 10*3/uL (ref 150–400)
RBC: 4.36 MIL/uL (ref 3.87–5.11)
RDW: 15.8 % — ABNORMAL HIGH (ref 11.5–15.5)
WBC: 11.1 10*3/uL — AB (ref 4.0–10.5)

## 2015-09-10 LAB — PHOSPHORUS
PHOSPHORUS: 2.5 mg/dL (ref 2.5–4.6)
Phosphorus: 2.7 mg/dL (ref 2.5–4.6)

## 2015-09-10 LAB — BASIC METABOLIC PANEL
ANION GAP: 9 (ref 5–15)
Anion gap: 7 (ref 5–15)
BUN: 19 mg/dL (ref 6–20)
BUN: 15 mg/dL (ref 6–20)
CALCIUM: 8.1 mg/dL — AB (ref 8.9–10.3)
CHLORIDE: 108 mmol/L (ref 101–111)
CO2: 24 mmol/L (ref 22–32)
CO2: 25 mmol/L (ref 22–32)
CREATININE: 0.74 mg/dL (ref 0.44–1.00)
Calcium: 7.4 mg/dL — ABNORMAL LOW (ref 8.9–10.3)
Chloride: 110 mmol/L (ref 101–111)
Creatinine, Ser: 1.04 mg/dL — ABNORMAL HIGH (ref 0.44–1.00)
GFR calc Af Amer: >60 mL/min (ref >60)
GFR calc non Af Amer: 56 mL/min — ABNORMAL LOW (ref >60)
GFR calc non Af Amer: >60 mL/min (ref >60)
GLUCOSE: 129 mg/dL — AB (ref 65–99)
Glucose, Bld: 100 mg/dL — ABNORMAL HIGH (ref 65–99)
POTASSIUM: 3.4 mmol/L — AB (ref 3.5–5.1)
Potassium: 3.5 mmol/L (ref 3.5–5.1)
SODIUM: 142 mmol/L (ref 135–145)
Sodium: 141 mmol/L (ref 135–145)

## 2015-09-10 LAB — HEPATIC FUNCTION PANEL
ALT: 683 U/L — ABNORMAL HIGH (ref 14–54)
AST: 1009 U/L — ABNORMAL HIGH (ref 15–41)
Albumin: 2.8 g/dL — ABNORMAL LOW (ref 3.5–5.0)
Alkaline Phosphatase: 67 U/L (ref 38–126)
BILIRUBIN DIRECT: 0.2 mg/dL (ref 0.1–0.5)
BILIRUBIN INDIRECT: 0.6 mg/dL (ref 0.3–0.9)
TOTAL PROTEIN: 5.4 g/dL — AB (ref 6.5–8.1)
Total Bilirubin: 0.8 mg/dL (ref 0.3–1.2)

## 2015-09-10 LAB — APTT: aPTT: 37 seconds (ref 24–37)

## 2015-09-10 LAB — PROTIME-INR
INR: 1.22 (ref 0.00–1.49)
Prothrombin Time: 15.6 seconds — ABNORMAL HIGH (ref 11.6–15.2)

## 2015-09-10 LAB — LIPASE, BLOOD: LIPASE: 33 U/L (ref 11–51)

## 2015-09-10 LAB — STREP PNEUMONIAE URINARY ANTIGEN: STREP PNEUMO URINARY ANTIGEN: NEGATIVE

## 2015-09-10 LAB — GLUCOSE, CAPILLARY: GLUCOSE-CAPILLARY: 136 mg/dL — AB (ref 65–99)

## 2015-09-10 LAB — I-STAT CG4 LACTIC ACID, ED: LACTIC ACID, VENOUS: 1.97 mmol/L — AB (ref 0.5–1.9)

## 2015-09-10 LAB — MAGNESIUM
MAGNESIUM: 1.9 mg/dL (ref 1.7–2.4)
MAGNESIUM: 2.1 mg/dL (ref 1.7–2.4)
Magnesium: 2.1 mg/dL (ref 1.7–2.4)

## 2015-09-10 LAB — MRSA PCR SCREENING: MRSA BY PCR: NEGATIVE

## 2015-09-10 LAB — CK: CK TOTAL: 350 U/L — AB (ref 38–234)

## 2015-09-10 LAB — T4, FREE: Free T4: 1.06 ng/dL (ref 0.61–1.12)

## 2015-09-10 LAB — TSH: TSH: 0.238 u[IU]/mL — AB (ref 0.350–4.500)

## 2015-09-10 MED ORDER — SODIUM CHLORIDE 0.9 % IV BOLUS (SEPSIS)
500.0000 mL | Freq: Once | INTRAVENOUS | Status: AC
  Administered 2015-09-10: 500 mL via INTRAVENOUS

## 2015-09-10 MED ORDER — BUDESONIDE 0.25 MG/2ML IN SUSP
0.2500 mg | Freq: Two times a day (BID) | RESPIRATORY_TRACT | Status: DC
  Administered 2015-09-10 – 2015-09-14 (×9): 0.25 mg via RESPIRATORY_TRACT
  Filled 2015-09-10 (×10): qty 2

## 2015-09-10 MED ORDER — SODIUM CHLORIDE 0.9 % IV SOLN
250.0000 mL | INTRAVENOUS | Status: DC | PRN
  Administered 2015-09-10: 250 mL via INTRAVENOUS

## 2015-09-10 MED ORDER — SODIUM CHLORIDE 0.9 % IV SOLN
INTRAVENOUS | Status: DC
  Administered 2015-09-10 – 2015-09-12 (×3): via INTRAVENOUS
  Filled 2015-09-10: qty 1000

## 2015-09-10 MED ORDER — POTASSIUM CHLORIDE 10 MEQ/100ML IV SOLN
10.0000 meq | INTRAVENOUS | Status: AC
  Administered 2015-09-10 (×3): 10 meq via INTRAVENOUS
  Filled 2015-09-10 (×3): qty 100

## 2015-09-10 MED ORDER — IPRATROPIUM-ALBUTEROL 0.5-2.5 (3) MG/3ML IN SOLN
3.0000 mL | Freq: Four times a day (QID) | RESPIRATORY_TRACT | Status: DC
  Administered 2015-09-10 – 2015-09-11 (×6): 3 mL via RESPIRATORY_TRACT
  Filled 2015-09-10 (×6): qty 3

## 2015-09-10 MED ORDER — ENOXAPARIN SODIUM 40 MG/0.4ML ~~LOC~~ SOLN
40.0000 mg | SUBCUTANEOUS | Status: DC
  Administered 2015-09-10: 40 mg via SUBCUTANEOUS
  Filled 2015-09-10 (×3): qty 0.4

## 2015-09-10 MED ORDER — SODIUM CHLORIDE 0.9 % IV SOLN
1.5000 g | Freq: Three times a day (TID) | INTRAVENOUS | Status: DC
  Administered 2015-09-10 – 2015-09-14 (×13): 1.5 g via INTRAVENOUS
  Filled 2015-09-10 (×16): qty 1.5

## 2015-09-10 MED ORDER — AMIODARONE HCL IN DEXTROSE 360-4.14 MG/200ML-% IV SOLN
60.0000 mg/h | INTRAVENOUS | Status: DC
  Administered 2015-09-10 (×2): 60 mg/h via INTRAVENOUS
  Filled 2015-09-10 (×2): qty 200

## 2015-09-10 MED ORDER — CHLORHEXIDINE GLUCONATE 0.12 % MT SOLN
15.0000 mL | Freq: Two times a day (BID) | OROMUCOSAL | Status: DC
  Administered 2015-09-10 – 2015-09-14 (×8): 15 mL via OROMUCOSAL
  Filled 2015-09-10 (×2): qty 15

## 2015-09-10 MED ORDER — AMIODARONE HCL IN DEXTROSE 360-4.14 MG/200ML-% IV SOLN
30.0000 mg/h | INTRAVENOUS | Status: DC
  Administered 2015-09-11: 30 mg/h via INTRAVENOUS
  Filled 2015-09-10 (×3): qty 200

## 2015-09-10 MED ORDER — FUROSEMIDE 10 MG/ML IJ SOLN: 60.0000 mg | Freq: Once | INTRAMUSCULAR | Status: DC

## 2015-09-10 MED ORDER — AMIODARONE LOAD VIA INFUSION
150.0000 mg | Freq: Once | INTRAVENOUS | Status: AC
  Administered 2015-09-10: 150 mg via INTRAVENOUS
  Filled 2015-09-10: qty 83.34

## 2015-09-10 MED ORDER — CETYLPYRIDINIUM CHLORIDE 0.05 % MT LIQD
7.0000 mL | Freq: Two times a day (BID) | OROMUCOSAL | Status: DC
  Administered 2015-09-10 – 2015-09-14 (×7): 7 mL via OROMUCOSAL

## 2015-09-10 NOTE — ED Notes (Signed)
Admitting physician at the bedside.

## 2015-09-10 NOTE — H&P (Signed)
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  She has been on chronic narcotics for years but recently had her medications changed to long acting morphine.  Since this medication change her family states she has been progressively altered, and confused.  This morning she was found by her husband to be minimally arousible and he left her in bed to for to sleep.  She was then unarousible and he called 911.  EMS found her O2 sats in the 70s she was given narcan with partial response per EMS and ED.    In the ED found to have a large right sided infiltrate, given fluid and CTX/azithro.   PAST MEDICAL HISTORY :   has a past medical history of HLD (hyperlipidemia); Emphysema; Anxiety; Arthritis; GERD (gastroesophageal reflux disease); Anemia; Hiatal hernia; UTI (urinary tract infection); Unspecified hypothyroidism; Hyperglycemia; Neurotic excoriations; Squamous cell carcinoma ([place]); Depressive disorder, not elsewhere classified; Bruising; Benign neoplasm of colon; Degeneration of lumbar or lumbosacral intervertebral disc; Personal history of urinary calculi; Scoliosis (and kyphoscoliosis), idiopathic; Tobacco use disorder; Osteoporosis, unspecified; Insomnia, unspecified; Edema; Pure hypercholesterolemia; Chronic pain syndrome; Lumbosacral spondylosis without myelopathy; Allergic rhinitis, cause unspecified; Hematuria, unspecified; Unspecified vitamin D deficiency; Prurigo nodularis; and Pneumonia.  has past surgical history that includes Vaginal hysterectomy (1990); 2 harrington rods in back (1980); Tonsillectomy and adenoidectomy (1964); Cervical spine surgery (1980); lumbar spine surgery (1980); squamous cell carcinoma resection  (02/2010); Bunionectomy (2006); Joint replacement (03/16/12); and Abdominal hysterectomy. Prior to Admission medications   Medication Sig Start Date End Date Taking? Authorizing Provider  albuterol (PROVENTIL HFA;VENTOLIN HFA) 108 (90 BASE) MCG/ACT inhaler Inhale 2 puffs into the lungs every 6 (six) hours as needed for wheezing (cough, shortness of breath or wheezing.). 06/14/12  Yes Wardell Honour, MD  AMITIZA 24 MCG capsule TAKE 1 CAPSULE BY MOUTH TWICE DAILY AS NEEDED 04/27/15  Yes Historical Provider, MD  baclofen (LIORESAL) 10 MG tablet Take 10 mg by mouth 2 (two) times daily.  10/22/14  Yes Historical Provider, MD  budesonide (PULMICORT FLEXHALER) 180 MCG/ACT inhaler Inhale 1 puff into the lungs 2 (two) times daily. 07/12/15 07/11/16 Yes Historical Provider, MD  clonazePAM (KLONOPIN) 1 MG tablet Take 1 tablet (1 mg total) by mouth 2 (two) times daily as needed. Patient taking differently: Take 1 mg by mouth 2 (two) times daily.  05/03/15  Yes Gonzella Lex, MD  DULoxetine (CYMBALTA) 60 MG capsule Take 1 capsule (60 mg total) by mouth daily. 05/03/15  Yes Gonzella Lex, MD  esomeprazole (NEXIUM) 40 MG capsule Take 1 capsule (40 mg total) by mouth daily. 05/24/14  Yes Wardell Honour, MD  fexofenadine (ALLEGRA) 180 MG tablet Take 1 tablet (180 mg total) by mouth daily. 05/24/14  Yes Wardell Honour, MD  furosemide (LASIX) 40 MG tablet Take 1 tablet (40 mg total) by mouth daily. 05/24/14  Yes Wardell Honour, MD  gabapentin (NEURONTIN) 300 MG capsule Take 300 mg by mouth 3 (three) times daily. 10/16/14  Yes Historical Provider, MD  levothyroxine (SYNTHROID, LEVOTHROID) 88 MCG tablet Take 1 tablet (88 mcg total) by mouth daily. Patient taking differently: Take 88 mcg by mouth daily before breakfast.  05/24/14  Yes Wardell Honour, MD  montelukast (  SINGULAIR) 10 MG tablet Take 10 mg by mouth at bedtime. 09/04/15  Yes Historical Provider, MD  morphine (MS CONTIN) 60 MG 12 hr tablet Take 60 mg by mouth every 8 (eight)  hours.   Yes Historical Provider, MD  nitroGLYCERIN (NITROSTAT) 0.4 MG SL tablet Place 1 tablet (0.4 mg total) under the tongue every 5 (five) minutes as needed. 05/10/13  Yes Minna Merritts, MD  NUCYNTA 100 MG TABS TAKE 1 TABLET BY MOUTH three times a day 04/06/15  Yes Historical Provider, MD  OXYGEN Inhale 2 L into the lungs at bedtime.   Yes Historical Provider, MD  potassium chloride SA (K-DUR,KLOR-CON) 20 MEQ tablet Take 1 tablet (20 mEq total) by mouth daily. 05/24/14  Yes Wardell Honour, MD  PULMICORT FLEXHALER 180 MCG/ACT inhaler INHALE 1 INHALATION INTO THE LUNGS 2 (TWO) TIMES DAILY. 04/03/15  Yes Historical Provider, MD  ranitidine (ZANTAC) 150 MG tablet Take 1 tablet (150 mg total) by mouth at bedtime. Patient taking differently: Take 150 mg by mouth daily as needed for heartburn.  10/26/13  Yes Wardell Honour, MD  rosuvastatin (CRESTOR) 20 MG tablet Take 1 tablet (20 mg total) by mouth daily. 06/23/14  Yes Wardell Honour, MD  tretinoin (RETIN-A) 0.1 % cream Apply a pea sized amount to the face nightly 20 min after washing, avoiding area around the eyes and creases around the nose. 01/25/14  Yes Historical Provider, MD  zolpidem (AMBIEN) 10 MG tablet Take 1 tablet (10 mg total) by mouth at bedtime as needed for sleep. 05/03/15  Yes Gonzella Lex, MD   Allergies  Allergen Reactions  . Latex Other (See Comments)    OPEN SORES  . Levofloxacin   . Sulfonamide Derivatives     FAMILY HISTORY:  indicated that her mother is alive. She indicated that her father is deceased. She indicated that all of her four sisters are alive. She indicated that three of her four brothers are alive. She indicated that her maternal grandmother is deceased. She indicated that her maternal grandfather is deceased. She indicated that her paternal grandmother is deceased. She indicated that her paternal grandfather is deceased.  SOCIAL HISTORY:  reports that she quit smoking about 8 years ago. Her smoking use included  Cigarettes. She started smoking about 30 years ago. She has a 25 pack-year smoking history. She has never used smokeless tobacco. She reports that she does not drink alcohol or use illicit drugs.  REVIEW OF SYSTEMS:   Per family: Positive for: nausea/vomiting, lethargy,encephalopathy Negative for: Fever, chills, dysuria, chest pain, SOB, cough, sputum production.  SUBJECTIVE:   VITAL SIGNS: Temp:  [103 F (39.4 C)] 103 F (39.4 C) (07/16 2030) Pulse Rate:  [87-97] 87 (07/16 2315) Resp:  [21-30] 25 (07/16 2315) BP: (98-142)/(54-87) 126/67 mmHg (07/16 2315) SpO2:  [90 %-97 %] 95 % (07/16 2359) Weight:  [90.992 kg (200 lb 9.6 oz)] 90.992 kg (200 lb 9.6 oz) (07/16 2216) HEMODYNAMICS:   VENTILATOR SETTINGS:   INTAKE / OUTPUT:  Intake/Output Summary (Last 24 hours) at 09/10/15 0022 Last data filed at 09/09/15 2252  Gross per 24 hour  Intake   3300 ml  Output    900 ml  Net   2400 ml    PHYSICAL EXAMINATION: General:  NAD, responsive but minimally participatory Neuro:  Moved all limbs spontaneously, tracks, nods appropriately, additional exam limited by patient particpation HEENT:  NCAT Cardiovascular:  RRR, S1/S2, no m/r/g Lungs:  RLL rales. Left lung CTA no  w/r/r Abdomen:  Soft, hypoactive bowel sounds, TTP diffusely Musculoskeletal:  Normal bulk and tone Skin:  1-2+ LE pitting edema b/l  LABS:  CBC  Recent Labs Lab 09/09/15 2104  WBC 13.1*  HGB 12.5  HCT 39.9  PLT 272   Coag's No results for input(s): APTT, INR in the last 168 hours. BMET  Recent Labs Lab 09/09/15 2040  NA 139  K 4.0  CL 105  CO2 26  BUN 24*  CREATININE 1.74*  GLUCOSE 120*   Electrolytes  Recent Labs Lab 09/09/15 2040  CALCIUM 8.7*   Sepsis Markers  Recent Labs Lab 09/09/15 2057  LATICACIDVEN 2.09*   ABG  Recent Labs Lab 09/09/15 2044  PHART 7.471*  PCO2ART 37.4  PO2ART 50.0*   Liver Enzymes  Recent Labs Lab 09/09/15 2040  AST 1558*  ALT 776*  ALKPHOS 69   BILITOT 0.9  ALBUMIN 3.1*   Cardiac Enzymes No results for input(s): TROPONINI, PROBNP in the last 168 hours. Glucose  Recent Labs Lab 09/09/15 2054  GLUCAP 104*    Imaging Ct Head Wo Contrast  09/09/2015  CLINICAL DATA:  65 year old female with altered mental status. New prescription for morphine. Initial encounter. EXAM: CT HEAD WITHOUT CONTRAST TECHNIQUE: Contiguous axial images were obtained from the base of the skull through the vertex without intravenous contrast. COMPARISON:  None. FINDINGS: No intracranial hemorrhage or CT evidence of large acute infarct. No intracranial mass lesion noted on this unenhanced exam. No hydrocephalus. Partial opacification left mastoid air cells. No acute orbital abnormality. IMPRESSION: No intracranial hemorrhage or CT evidence of large acute infarct. Minimal partial opacification left mastoid air cells. Electronically Signed   By: Genia Del M.D.   On: 09/09/2015 23:12   Dg Chest Portable 1 View  09/09/2015  CLINICAL DATA:  Altered mental status. EXAM: PORTABLE CHEST 1 VIEW COMPARISON:  None. FINDINGS: Mild cardiomegaly noted. Right lung airspace disease is identified. There is no evidence of pneumothorax. Left basilar first atelectasis versus scarring noted. Thoracic surgical hardware (right rod fracture at the T11 level) and probable cervical neurostimulator noted. IMPRESSION: Diffuse right lung airspace disease -question pneumonia/aspiration. Electronically Signed   By: Margarette Canada M.D.   On: 09/09/2015 21:47     ASSESSMENT / PLAN: 65 yo CF admitted with RLL infiltrate that likely represents aspiration PNA vs pneumonitis, she is afebrile with mild leukocytosis. Aspiration likely 2/2 acute on subacute encephalopathy likely due to overdose of narcotics that is unintentional.  PULMONARY  A: Aspiration pneumonitis Acute on chronic hypoxemic respiratory failure - with shunt physiology  COPD/emphesyma P:   - sputum culture - s/p  CTX/Azithro - hold further Abx given high likelihood for aspiration pneumonitis - Non rebreathing - if worsening status plan to intubate - continue budesonide and start duonebs Q6h - lasix 60mg  IV x1   CARDIOVASCULAR A:  LE Edema CHF - BNP 462 Troponinemia P:  - TTE tomorrow - Furosemide 60mg  IV x1 - Trend troponin Q6 - EKG - trend lactic acid  RENAL A:   AKI - baseline Scr 0.7 - possible rabdo Possible rhabdo P:   - check CK - s/p hydration - lasix now for possible CHF - Place foley cath  GASTROINTESTINAL A:   Transamonitis - possible shock liver P:   - RUQ [place] - Trend LFTs - ammonia normal  HEMATOLOGIC A:   Leukocytosis - likely reactive P:  - trend  INFECTIOUS A:   Aspiration PNA vs pneumonitis P:   BCx2 09/10/2015 UC NA  Sputum 09/10/2015 Abx: CTX/Azitro, start date7/17/2017  Holding abx now given likelihood of pneumonits   ENDOCRINE A:   Reduced TSH   Hypothyroid P:   - check FT4 - Restart synthroid tomorrow  NEUROLOGIC A:   Acute encephalopathy - unclear etiology , possible narcosis P:   - CT head negative - holding home sedating medications for now - Will need to restart baclofen within 48 hours to avoid withdrawal - holding narcotics - Narcan PRN for somnolence   FAMILY  - Updates: Husband and daughter at bedside, updated by me.   FC/FT NPO Lovenox for ppx  Total critical care time: 30 min  Critical care time was exclusive of separately billable procedures and treating other patients.  Critical care was necessary to treat or prevent imminent or life-threatening deterioration.  Critical care was time spent personally by me on the following activities: development of treatment plan with patient and/or surrogate as well as nursing, discussions with consultants, evaluation of patient's response to treatment, examination of patient, obtaining history from patient or surrogate, ordering and performing treatments and interventions,  ordering and review of laboratory studies, ordering and review of radiographic studies, pulse oximetry and re-evaluation of patient's condition.   Meribeth Mattes, DO., MS [facility]  [facility]ulmonary and [place] Pager: [phone]  09/10/2015, 12:22 AM

## 2015-09-10 NOTE — Progress Notes (Signed)
[place] Progress Note Patient Name: Sonya [name] DOB: 1950/12/04 MRN: [id number]   Date of Service  09/10/2015  HPI/Events of Note  Elevated HR - V rate 130's to 150's. Irregularly Irregular. Looks like AFIB. BP = 111/71.  eICU Interventions  Will order: 1. 12 Lead EKG now.  2. BMP and Mg++ level now.  3. Amiodarone IV load and infusion.      Intervention Category Major Interventions: Arrhythmia - evaluation and management  Sommer,Steven Eugene 09/10/2015, 5:45 PM

## 2015-09-10 NOTE — Progress Notes (Signed)
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  She has been on chronic narcotics for years but recently had her medications changed to long acting morphine.  Since this medication change her family states she has been progressively altered, and confused.  This morning she was found by her husband to be minimally arousible and he left her in bed to for to sleep.  She was then unarousible and he called 911.  EMS found her O2 sats in the 70s she was given narcan with partial response per EMS and ED.    In the ED found to have a large right sided infiltrate, given fluid and CTX/azithro.    SUBJECTIVE:  Afebrile satn 100% on NRB, does desaturate when off Bp soft, diuresed 1.2 L with lasix  VITAL SIGNS: Temp:  [98.3 F (36.8 C)-103 F (39.4 C)] 98.3 F (36.8 C) (07/17 1112) Pulse Rate:  [74-97] 84 (07/17 1400) Resp:  [12-30] 17 (07/17 1400) BP: (72-142)/(47-87) 103/69 mmHg (07/17 1400) SpO2:  [90 %-100 %] 92 % (07/17 1400) FiO2 (%):  [55 %-100 %] 55 % (07/17 1200) Weight:  [90.992 kg (200 lb 9.6 oz)-92.2 kg (203 lb 4.2 oz)] 92.2 kg (203 lb 4.2 oz) (07/17 0459) HEMODYNAMICS:   VENTILATOR SETTINGS: Vent Mode:  [-]  FiO2 (%):  [55 %-100 %] 55 % INTAKE / OUTPUT:  Intake/Output Summary (Last 24 hours) at 09/10/15 1426 Last data filed at 09/10/15 1400  Gross per 24 hour  Intake   3920 ml  Output   1570 ml  Net   2350 ml    PHYSICAL EXAMINATION: General:  NAD, chr ill appearing Neuro:  Moved all limbs spontaneously, tracks, nods appropriately, follows 1 step commands HEENT:  NCAT Cardiovascular:  RRR, S1/S2, no m/r/g Lungs:  RLL rales. Left lung CTA no w/r/r Abdomen:  Soft, hypoactive bowel sounds, non  tender Musculoskeletal:  Normal bulk and tone Skin:  1-2+ LE pitting edema b/l  LABS:  CBC  Recent Labs Lab 09/09/15 2104 09/10/15 0614  WBC 13.1* 11.1*  HGB 12.5 11.4*  HCT 39.9 39.9  PLT 272 PLATELET CLUMPS NOTED ON SMEAR, COUNT APPEARS ADEQUATE   Coag's  Recent Labs Lab 09/10/15 0132  APTT 37  INR 1.22   BMET  Recent Labs Lab 09/09/15 2040 09/10/15 0614  NA 139 141  K 4.0 3.4*  CL 105 108  CO2 26 24  BUN 24* 19  CREATININE 1.74* 1.04*  GLUCOSE 120* 129*   Electrolytes  Recent Labs Lab 09/09/15 2040 09/10/15 0132 09/10/15 0614  CALCIUM 8.7*  --  7.4*  MG  --  1.9 2.1  PHOS  --  2.5 2.7   Sepsis Markers  Recent Labs Lab 09/09/15 2057 09/10/15 0145  LATICACIDVEN 2.09* 1.97*   ABG  Recent Labs Lab 09/09/15 2044  PHART 7.471*  PCO2ART 37.4  PO2ART 50.0*   Liver Enzymes  Recent Labs Lab 09/09/15 2040 09/10/15 0614  AST 1558* 1009*  ALT 776* 683*  ALKPHOS 69 67  BILITOT 0.9 0.8  ALBUMIN 3.1* 2.8*   Cardiac Enzymes  Recent Labs Lab 09/10/15 0132 09/10/15 0614 09/10/15 1316  TROPONINI 0.38* 0.31* 0.20*   Glucose  Recent Labs Lab 09/09/15 2054 09/10/15 0452  GLUCAP 104* 136*    Imaging Ct Head Wo Contrast  09/09/2015  CLINICAL DATA:  65 year old female with altered mental status. New prescription for morphine. Initial encounter. EXAM: CT HEAD WITHOUT CONTRAST TECHNIQUE: Contiguous axial images were obtained from the base of the skull through the vertex without intravenous contrast. COMPARISON:  None. FINDINGS: No intracranial hemorrhage or CT evidence of large acute infarct. No intracranial mass lesion noted on this unenhanced exam. No hydrocephalus. Partial opacification left mastoid air cells. No acute orbital abnormality. IMPRESSION: No intracranial hemorrhage or CT evidence of large acute infarct. Minimal partial opacification left mastoid air cells. Electronically Signed   By: Genia Del M.D.   On: 09/09/2015 23:12    US Abdomen Limited  09/10/2015  CLINICAL DATA:  Fever.  Right upper quadrant pain. EXAM: US ABDOMEN LIMITED - RIGHT UPPER QUADRANT COMPARISON:  None. FINDINGS: Gallbladder: No gallstones or wall thickening visualized. No sonographic Murphy sign noted by sonographer. Common bile duct: Diameter: 2 mm Liver: Slight increased echogenicity. IMPRESSION: 1. The gallbladder is unremarkable on this study. 2. Mild hepatic steatosis. Electronically Signed   By: Dorise Bullion III M.D   On: 09/10/2015 02:33   Dg Chest Port 1 View  09/10/2015  CLINICAL DATA:  65 year old female with shortness of breath and wet sound Ing cough. Query aspiration. Initial encounter. EXAM: PORTABLE CHEST 1 VIEW COMPARISON:  09/09/2015, [facility] chest radiographs 06/21/2015 and earlier FINDINGS: Portable AP semi upright view at at 0908 hours. 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 Right abdomen generator device with cervical spinal stimulator re- demonstrated. Confluent subtotal right lung airspace opacity has not significantly changed from yesterday. Streaky and patchy opacity at the left lung base also is stable. No superimposed pneumothorax. Probable small pleural effusions. Stable cardiac size and mediastinal contours. Calcified aortic atherosclerosis. IMPRESSION: 1. Stable ventilation with subtotal right lung airspace opacity suspicious for aspiration and/or pneumonia in this setting. Lesser patchy opacity at the left lung base. 2. Probable small bilateral pleural effusions. 3. Stable visible posterior spine hardware. Cervical spinal stimulator device again noted. Electronically Signed   By: Genevie Ann M.D.   On: 09/10/2015 10:00   Dg Chest Portable 1 View  09/09/2015  CLINICAL DATA:  Altered mental status. EXAM: PORTABLE CHEST 1 VIEW COMPARISON:  None. FINDINGS: Mild cardiomegaly noted. Right lung airspace disease is identified. There is no evidence of pneumothorax.  Left basilar first atelectasis versus scarring noted. Thoracic surgical hardware (right rod fracture at the T11 level) and probable cervical neurostimulator noted. IMPRESSION: Diffuse right lung airspace disease -question pneumonia/aspiration. Electronically Signed   By: Margarette Canada M.D.   On: 09/09/2015 21:47     ASSESSMENT / PLAN: 65 yo CF admitted with RLL infiltrate that likely represents aspiration PNA  2/2 acute on subacute encephalopathy likely due to overdose of narcotics that is unintentional.  PULMONARY  A: Aspiration pneumonitis Acute on chronic hypoxemic respiratory failure - with shunt physiology  COPD/emphesyma P:   - change abx to unasyn -  NRB - appears to be improving, if worsening status plan to intubate - continue budesonide and start duonebs Q6h   CARDIOVASCULAR A:  LE Edema CHF - BNP 462 Elevated Troponins P:  - TTE    RENAL A:   AKI - baseline Scr 0.7 - possible rabdo Possible rhabdo P:   - gentle hydration - hold lasix  GASTROINTESTINAL A:   Transaminitis - possible shock liver - RUQ [place] nml ,-  ammonia normal P:   - Trend LFTs   HEMATOLOGIC A:   Leukocytosis - likely reactive P:  - trend  INFECTIOUS A:   Aspiration PNA vs pneumonitis P:   BCx2 09/10/2015 UC NA Sputum 09/10/2015 Abx: unasyn >>09/10/2015    ENDOCRINE A:   Reduced TSH   Hypothyroid P:   - check FT4 - Restart synthroid when able tot ake PO  NEUROLOGIC A:   Acute encephalopathy - unclear etiology , possible narcosis - CT head negative P:   - holding home sedating medications for now - Will need to restart baclofen & narcotics  within 48 hours to avoid withdrawal   FAMILY  - Updates: Husband and daughter at bedside, updated 7/16    Total critical care time: 66 min  Kara Mead MD. [facility]. [place] [facility] Pager [phone] If no response call [phone]   09/10/2015    09/10/2015, 2:26 PM

## 2015-09-10 NOTE — ED Notes (Signed)
Admitting notified of continued low blood pressure.  500cc bolus ordered.

## 2015-09-11 ENCOUNTER — Inpatient Hospital Stay (HOSPITAL_COMMUNITY): Payer: BLUE CROSS/BLUE SHIELD

## 2015-09-11 DIAGNOSIS — J9601 Acute respiratory failure with hypoxia: Secondary | ICD-10-CM

## 2015-09-11 DIAGNOSIS — L8990 Pressure ulcer of unspecified site, unspecified stage: Secondary | ICD-10-CM | POA: Diagnosis present

## 2015-09-11 DIAGNOSIS — J690 Pneumonitis due to inhalation of food and vomit: Secondary | ICD-10-CM | POA: Diagnosis present

## 2015-09-11 DIAGNOSIS — G9340 Encephalopathy, unspecified: Secondary | ICD-10-CM | POA: Diagnosis present

## 2015-09-11 DIAGNOSIS — R0600 Dyspnea, unspecified: Secondary | ICD-10-CM

## 2015-09-11 LAB — COMPREHENSIVE METABOLIC PANEL
ALK PHOS: 61 U/L (ref 38–126)
ALT: 610 U/L — AB (ref 14–54)
AST: 486 U/L — AB (ref 15–41)
Albumin: 2.4 g/dL — ABNORMAL LOW (ref 3.5–5.0)
Anion gap: 8 (ref 5–15)
BUN: 12 mg/dL (ref 6–20)
CALCIUM: 7.7 mg/dL — AB (ref 8.9–10.3)
CHLORIDE: 110 mmol/L (ref 101–111)
CO2: 23 mmol/L (ref 22–32)
CREATININE: 0.57 mg/dL (ref 0.44–1.00)
Glucose, Bld: 116 mg/dL — ABNORMAL HIGH (ref 65–99)
Potassium: 3.3 mmol/L — ABNORMAL LOW (ref 3.5–5.1)
Sodium: 141 mmol/L (ref 135–145)
Total Bilirubin: 0.6 mg/dL (ref 0.3–1.2)
Total Protein: 5 g/dL — ABNORMAL LOW (ref 6.5–8.1)

## 2015-09-11 LAB — CBC
HCT: 34.3 % — ABNORMAL LOW (ref 36.0–46.0)
HEMATOCRIT: 25.9 % — AB (ref 36.0–46.0)
Hemoglobin: 7.8 g/dL — ABNORMAL LOW (ref 12.0–15.0)
Hemoglobin: 10.9 g/dL — ABNORMAL LOW (ref 12.0–15.0)
MCH: 27.6 pg (ref 26.0–34.0)
MCH: 28.2 pg (ref 26.0–34.0)
MCHC: 30.1 g/dL (ref 30.0–36.0)
MCHC: 31.8 g/dL (ref 30.0–36.0)
MCV: 91.5 fL (ref 78.0–100.0)
MCV: 88.6 fL (ref 78.0–100.0)
PLATELETS: 181 10*3/uL (ref 150–400)
Platelets: 163 10*3/uL (ref 150–400)
RBC: 2.83 MIL/uL — ABNORMAL LOW (ref 3.87–5.11)
RBC: 3.87 MIL/uL (ref 3.87–5.11)
RDW: 15.7 % — AB (ref 11.5–15.5)
RDW: 15.5 % (ref 11.5–15.5)
WBC: 8.6 10*3/uL (ref 4.0–10.5)
WBC: 10.7 10*3/uL — AB (ref 4.0–10.5)

## 2015-09-11 LAB — ECHOCARDIOGRAM COMPLETE: Weight: 3252.23 oz

## 2015-09-11 LAB — LEGIONELLA PNEUMOPHILA SEROGP 1 UR AG: L. PNEUMOPHILA SEROGP 1 UR AG: NEGATIVE

## 2015-09-11 MED ORDER — BACLOFEN 5 MG HALF TABLET
5.0000 mg | ORAL_TABLET | Freq: Two times a day (BID) | ORAL | Status: DC | PRN
  Administered 2015-09-11 – 2015-09-13 (×3): 5 mg via ORAL
  Filled 2015-09-11 (×4): qty 1

## 2015-09-11 MED ORDER — LEVALBUTEROL HCL 1.25 MG/0.5ML IN NEBU
1.2500 mg | INHALATION_SOLUTION | Freq: Three times a day (TID) | RESPIRATORY_TRACT | Status: DC
  Administered 2015-09-12 – 2015-09-14 (×8): 1.25 mg via RESPIRATORY_TRACT
  Filled 2015-09-11 (×8): qty 0.5

## 2015-09-11 MED ORDER — LEVOTHYROXINE SODIUM 25 MCG PO TABS
25.0000 ug | ORAL_TABLET | Freq: Every day | ORAL | Status: DC
  Administered 2015-09-12 – 2015-09-14 (×3): 25 ug via ORAL
  Filled 2015-09-11 (×3): qty 1

## 2015-09-11 MED ORDER — LEVALBUTEROL HCL 1.25 MG/0.5ML IN NEBU
1.2500 mg | INHALATION_SOLUTION | Freq: Four times a day (QID) | RESPIRATORY_TRACT | Status: DC
  Administered 2015-09-11 (×2): 1.25 mg via RESPIRATORY_TRACT
  Filled 2015-09-11 (×4): qty 0.5

## 2015-09-11 MED ORDER — OXYCODONE HCL 5 MG PO TABS
5.0000 mg | ORAL_TABLET | Freq: Four times a day (QID) | ORAL | Status: DC | PRN
  Administered 2015-09-11 – 2015-09-14 (×12): 5 mg via ORAL
  Filled 2015-09-11 (×12): qty 1

## 2015-09-11 MED ORDER — ONDANSETRON HCL 4 MG/2ML IJ SOLN
4.0000 mg | Freq: Four times a day (QID) | INTRAMUSCULAR | Status: DC | PRN
  Administered 2015-09-11 – 2015-09-12 (×2): 4 mg via INTRAVENOUS
  Filled 2015-09-11 (×2): qty 2

## 2015-09-11 MED ORDER — POTASSIUM CHLORIDE CRYS ER 20 MEQ PO TBCR
40.0000 meq | EXTENDED_RELEASE_TABLET | Freq: Once | ORAL | Status: AC
  Administered 2015-09-11: 40 meq via ORAL
  Filled 2015-09-11: qty 2

## 2015-09-11 NOTE — Progress Notes (Signed)
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  She has been on chronic narcotics for years but recently had her medications changed to long acting morphine.  Since this medication change her family states she has been progressively altered, and confused.  This morning she was found by her husband to be minimally arousible and he left her in bed to for to sleep.  She was then unarousible and he called 911.  EMS found her O2 sats in the 70s she was given narcan with partial response per EMS and ED.    In the ED found to have a large right sided infiltrate, given fluid and CTX/azithro   SUBJECTIVE:  Afebrile Vitals stable besides some tachycardia earlier in the night On 64-5 L [place] overnight   VITAL SIGNS: Temp:  [97.7 F (36.5 C)-98.3 F (36.8 C)] 98.2 F (36.8 C) (07/18 0754) Pulse Rate:  [56-145] 76 (07/18 1000) Resp:  [15-25] 18 (07/18 1000) BP: (87-138)/(58-117) 131/74 mmHg (07/18 1000) SpO2:  [91 %-99 %] 94 % (07/18 1009) FiO2 (%):  [55 %] 55 % (07/17 1600) Weight:  [203 lb 4.2 oz (92.2 kg)] 203 lb 4.2 oz (92.2 kg) (07/18 0157) HEMODYNAMICS:   VENTILATOR SETTINGS: Vent Mode:  [-]  FiO2 (%):  [55 %] 55 % INTAKE / OUTPUT:  Intake/Output Summary (Last 24 hours) at 09/11/15 1111 Last data filed at 09/11/15 1027  Gross per 24 hour  Intake 1914.89 ml  Output   1100 ml  Net 814.89 ml    PHYSICAL EXAMINATION: General:  NAD, conversive Neuro:  AO x 3, Moving all limbs spontaneously HEENT:  NCAT, Sonya [name] Cardiovascular:  RRR, S1/S2, no m/r/g Lungs:  RLL rales. Left lung CTA no w/r/r Abdomen:  Soft, hypoactive bowel sounds, non tender Musculoskeletal:  Normal bulk and tone Skin:  1-2+ LE pitting  edema b/l  LABS:  CBC  Recent Labs Lab 09/09/15 2104 09/10/15 0614 09/11/15 0610  WBC 13.1* 11.1* 8.6  HGB 12.5 11.4* 7.8*  HCT 39.9 39.9 25.9*  PLT 272 PLATELET CLUMPS NOTED ON SMEAR, COUNT APPEARS ADEQUATE 163   Coag's  Recent Labs Lab 09/10/15 0132  APTT 37  INR 1.22   BMET  Recent Labs Lab 09/10/15 0614 09/10/15 1812 09/11/15 0250  NA 141 142 141  K 3.4* 3.5 3.3*  CL 108 110 110  CO2 24 25 23   BUN 19 15 12   CREATININE 1.04* 0.74 0.57  GLUCOSE 129* 100* 116*   Electrolytes  Recent Labs Lab 09/10/15 0132 09/10/15 0614 09/10/15 1812 09/11/15 0250  CALCIUM  --  7.4* 8.1* 7.7*  MG 1.9 2.1 2.1  --   PHOS 2.5 2.7  --   --    Sepsis Markers  Recent Labs Lab 09/09/15 2057 09/10/15 0145  LATICACIDVEN 2.09* 1.97*   ABG  Recent Labs Lab 09/09/15 2044 09/10/15 1838  PHART 7.471* 7.447  PCO2ART 37.4 39.6  PO2ART 50.0* 78.0*   Liver Enzymes  Recent Labs Lab 09/09/15 2040 09/10/15 0614 09/11/15 0250  AST 1558* 1009* 486*  ALT 776* 683* 610*  ALKPHOS 69 67 61  BILITOT 0.9 0.8 0.6  ALBUMIN 3.1* 2.8* 2.4*   Cardiac Enzymes  Recent Labs Lab 09/10/15 0132 09/10/15 [id number]  09/10/15 1316  TROPONINI 0.38* 0.31* 0.20*   Glucose  Recent Labs Lab 09/09/15 2054 09/10/15 0452  GLUCAP 104* 136*    Imaging Dg Chest Port 1 View  09/11/2015  CLINICAL DATA:  Respiratory failure. EXAM: PORTABLE CHEST 1 VIEW COMPARISON:  09/10/2015. FINDINGS: 0531 hours. The cardio pericardial silhouette is enlarged. Airspace disease in the right lung has become slightly more diffuse in the interval. There is some atelectasis or pneumonia medial left base. Tiny bilateral pleural effusions again noted. Cervical spinal stimulator again noted. Bilateral thoracolumbar fusion rods are evident with fracture of the right rod again noted. Telemetry leads overlie the chest. IMPRESSION: Focal right mid lung airspace disease is become slightly more diffuse in the interval. Tiny  bilateral pleural effusions. Electronically Signed   By: Misty Stanley M.D.   On: 09/11/2015 07:15     ASSESSMENT / PLAN: 65 yo CF admitted with RLL infiltrate that likely represents aspiration PNA  2/2 acute on subacute encephalopathy likely due to overdose of narcotics that is unintentional.  PULMONARY  A: Aspiration pneumonitis Acute on chronic hypoxemic respiratory failure - with shunt physiology  Sonya [name]/emphesyma P:   - abx as below - On [place] now, continue to wean as tolerated (of note, uses 2L [place] at home at night) - Continue budesonide and Duonebs  - Transfer to SDU  CARDIOVASCULAR A:  LE Edema CHF - BNP 462 Elevated Troponins P:  - TTE  - Stop Amiodarine  RENAL A:   AKI - resolved Possible rhabdo P:   - gentle hydration - hold lasix   GASTROINTESTINAL A:   Transaminitis - possible shock liver - RUQ [place] nml ,- ammonia normal P:   - Trend LFTs  HEMATOLOGIC A:   Leukocytosis - resolved P:  Daily CBCs  INFECTIOUS A:   Aspiration PNA vs pneumonitis P:   BCx2 09/10/2015 NGTD UC NA Sputum 09/10/2015 Continue Unasyn (day 2)  ENDOCRINE A:   Reduced TSH   Sonya [name]- Synthroid 88 mcg daily at home P:   - Restart synthroid today at low dose (25 mcg daily)  NEUROLOGIC A:   Acute encephalopathy - unclear etiology , possible narcosis- resolved - CT head negative P:   - Restart home baclofen & Oxy IR    FAMILY  - Updates: Husband and daughter at bedside, updated 7/16 and 7/18  Smitty Cords, MD [place], PGY-2   09/11/2015, 11:11 AM

## 2015-09-11 NOTE — Progress Notes (Signed)
Echocardiogram 2D Echocardiogram has been performed.  Joelene Millin 09/11/2015, 12:06 PM

## 2015-09-12 DIAGNOSIS — I4891 Unspecified atrial fibrillation: Secondary | ICD-10-CM

## 2015-09-12 DIAGNOSIS — I481 Persistent atrial fibrillation: Secondary | ICD-10-CM

## 2015-09-12 DIAGNOSIS — I272 Other secondary pulmonary hypertension: Secondary | ICD-10-CM

## 2015-09-12 LAB — CBC
HCT: 34.3 % — ABNORMAL LOW (ref 36.0–46.0)
Hemoglobin: 11.1 g/dL — ABNORMAL LOW (ref 12.0–15.0)
MCH: 28.4 pg (ref 26.0–34.0)
MCHC: 32.4 g/dL (ref 30.0–36.0)
MCV: 87.7 fL (ref 78.0–100.0)
PLATELETS: 180 10*3/uL (ref 150–400)
RBC: 3.91 MIL/uL (ref 3.87–5.11)
RDW: 15.9 % — AB (ref 11.5–15.5)
WBC: 9.6 10*3/uL (ref 4.0–10.5)

## 2015-09-12 LAB — BASIC METABOLIC PANEL
Anion gap: 10 (ref 5–15)
BUN: 7 mg/dL (ref 6–20)
CALCIUM: 8.2 mg/dL — AB (ref 8.9–10.3)
CO2: 21 mmol/L — ABNORMAL LOW (ref 22–32)
CREATININE: 0.52 mg/dL (ref 0.44–1.00)
Chloride: 113 mmol/L — ABNORMAL HIGH (ref 101–111)
GFR calc Af Amer: >60 mL/min (ref >60)
Glucose, Bld: 68 mg/dL (ref 65–99)
Potassium: 4.5 mmol/L (ref 3.5–5.1)
SODIUM: 144 mmol/L (ref 135–145)

## 2015-09-12 MED ORDER — ROSUVASTATIN CALCIUM 10 MG PO TABS
20.0000 mg | ORAL_TABLET | Freq: Every day | ORAL | Status: DC
  Administered 2015-09-12 – 2015-09-13 (×2): 20 mg via ORAL
  Filled 2015-09-12 (×2): qty 1
  Filled 2015-09-12: qty 2

## 2015-09-12 MED ORDER — PANTOPRAZOLE SODIUM 40 MG PO TBEC
40.0000 mg | DELAYED_RELEASE_TABLET | Freq: Every day | ORAL | Status: DC
  Administered 2015-09-12 – 2015-09-14 (×3): 40 mg via ORAL
  Filled 2015-09-12 (×3): qty 1

## 2015-09-12 MED ORDER — DILTIAZEM LOAD VIA INFUSION
20.0000 mg | Freq: Once | INTRAVENOUS | Status: AC
  Administered 2015-09-12: 20 mg via INTRAVENOUS
  Filled 2015-09-12: qty 20

## 2015-09-12 MED ORDER — AMIODARONE HCL IN DEXTROSE 360-4.14 MG/200ML-% IV SOLN: 60.0000 mg/h | INTRAVENOUS | Status: DC

## 2015-09-12 MED ORDER — CLONAZEPAM 0.5 MG PO TABS
0.5000 mg | ORAL_TABLET | Freq: Two times a day (BID) | ORAL | Status: DC
  Administered 2015-09-12 – 2015-09-14 (×5): 0.5 mg via ORAL
  Filled 2015-09-12 (×5): qty 1

## 2015-09-12 MED ORDER — DEXTROSE 5 % IV SOLN
5.0000 mg/h | INTRAVENOUS | Status: DC
  Administered 2015-09-12: 5 mg/h via INTRAVENOUS
  Filled 2015-09-12: qty 100

## 2015-09-12 MED ORDER — ENOXAPARIN SODIUM 100 MG/ML ~~LOC~~ SOLN
1.0000 mg/kg | Freq: Two times a day (BID) | SUBCUTANEOUS | Status: DC
  Administered 2015-09-12 – 2015-09-13 (×3): 95 mg via SUBCUTANEOUS
  Filled 2015-09-12 (×3): qty 0.95

## 2015-09-12 MED ORDER — DULOXETINE HCL 60 MG PO CPEP
60.0000 mg | ORAL_CAPSULE | Freq: Every day | ORAL | Status: DC
  Administered 2015-09-12 – 2015-09-14 (×3): 60 mg via ORAL
  Filled 2015-09-12 (×3): qty 1

## 2015-09-12 MED ORDER — GI COCKTAIL ~~LOC~~
30.0000 mL | Freq: Three times a day (TID) | ORAL | Status: DC | PRN
  Administered 2015-09-12 – 2015-09-14 (×4): 30 mL via ORAL
  Filled 2015-09-12 (×4): qty 30

## 2015-09-12 MED ORDER — AMIODARONE HCL IN DEXTROSE 360-4.14 MG/200ML-% IV SOLN: 30.0000 mg/h | INTRAVENOUS | Status: DC

## 2015-09-12 MED ORDER — AMIODARONE LOAD VIA INFUSION
150.0000 mg | Freq: Once | INTRAVENOUS | Status: DC
  Filled 2015-09-12: qty 83.34

## 2015-09-12 MED ORDER — ASPIRIN EC 81 MG PO TBEC
81.0000 mg | DELAYED_RELEASE_TABLET | Freq: Every day | ORAL | Status: DC
  Administered 2015-09-12: 81 mg via ORAL
  Filled 2015-09-12 (×2): qty 1

## 2015-09-12 MED ORDER — CLONAZEPAM 0.5 MG PO TABS: 1.0000 mg | ORAL_TABLET | Freq: Two times a day (BID) | ORAL | Status: DC

## 2015-09-12 MED ORDER — DILTIAZEM HCL 60 MG PO TABS
60.0000 mg | ORAL_TABLET | Freq: Three times a day (TID) | ORAL | Status: DC
  Administered 2015-09-12 – 2015-09-13 (×3): 60 mg via ORAL
  Filled 2015-09-12 (×3): qty 1

## 2015-09-12 NOTE — Consult Note (Signed)
Cardiology Consult    Patient ID: Addaline Goch MRN: [id number], DOB/AGE: 09-02-1950   Admit date: 09/09/2015 Date of Consult: 09/12/2015  Primary Physician: Reginia Forts, MD Reason for Consult: Afib RVR  Primary Cardiologist: Saw Dr. Rockey Situ in 2015 Requesting Provider: Dr. Thereasa Solo  Patient Profile  Ms. Tenenbaum is a 65 year old female with a past medical history of hypothyroidism, depression, COPD, mild OSA (not on CPAP), and chronic back pain. Admitted with altered mental status and lethargy secondary to narcotic use. [place], cardiology consulted.   History of Present Illness  She has been on chronic narcotics for many years and recently her medications were changed to long acting morphine. Since that change she has been progressively altered and confused. She was found be her husband on 09/10/15 in her bed minimally arousable. He called EMS and on arrival her O2 saturations were in the 70's. She was given narcan and had partial response according to EMS.   Her chest X ray showed large right sided infiltrate, she was admitted to the ICU for sepsis.   She initially required non re-breather, but her condition has improved. She is off non re-breather and is more alert now. She developed atrial fibrillation with RVR with rates in 130-150's and was started on Amiodarone infusion. She converted to NSR the next morning on 09/11/15, but again developed Afib this am and was started on a Cardizem gtt.   She denies chest pain, says she has never been told she has an abnormal heart rhythm before. This morning she says she did not feel palpitations when the Afib started, but once her nurse told her she was in Afib, she did start to feel palpitations.   She is minimally active at home, only walks around the house. Denies chest pain, SOB and palpitations with this.    Past Medical History   Past Medical History  Diagnosis Date  . HLD (hyperlipidemia)   . Emphysema   . Anxiety     . Arthritis   . GERD (gastroesophageal reflux disease)   . Anemia   . Hiatal hernia   . UTI (urinary tract infection)     frequent  . Unspecified hypothyroidism   . Hyperglycemia   . Neurotic excoriations   . Squamous cell carcinoma ([place])   . Depressive disorder, not elsewhere classified   . Bruising   . Benign neoplasm of colon   . Degeneration of lumbar or lumbosacral intervertebral disc   . Personal history of urinary calculi   . Scoliosis (and kyphoscoliosis), idiopathic   . Tobacco use disorder   . Osteoporosis, unspecified   . Insomnia, unspecified   . Edema   . Pure hypercholesterolemia   . Chronic pain syndrome   . Lumbosacral spondylosis without myelopathy   . Allergic rhinitis, cause unspecified   . Hematuria, unspecified   . Unspecified vitamin D deficiency   . Prurigo nodularis   . Pneumonia     Past Surgical History  Procedure Laterality Date  . Vaginal hysterectomy  1990  . 2 harrington rods in back  1980  . Tonsillectomy and adenoidectomy  1964  . Cervical spine surgery  [place]  . Lumbar spine surgery  [place]  . Squamous cell carcinoma resection  02/2010    [name]  . Bunionectomy  2006    both feet  . Joint replacement  03/16/12    L TKR; R TKR  . Abdominal hysterectomy  endometriosis; ovaries resected; no cancer.     Allergies  Allergies  Allergen Reactions  . Latex Other (See Comments)    OPEN SORES  . Levofloxacin   . Sulfonamide Derivatives     Inpatient Medications    . ampicillin-sulbactam (UNASYN) IV  1.5 g Intravenous Q8H  . antiseptic oral rinse  7 mL Mouth Rinse q12n4p  . aspirin EC  81 mg Oral Daily  . budesonide  0.25 mg Inhalation BID  . chlorhexidine  15 mL Mouth Rinse BID  . enoxaparin (LOVENOX) injection  1 mg/kg Subcutaneous Q12H  . levalbuterol  1.25 mg Nebulization TID  . levothyroxine  25 mcg Oral QAC breakfast  . pantoprazole  40 mg Oral Q1200    Family History    Family History   Problem Relation Age of Onset  . Coronary artery disease Brother     stents    SIster stents also  . Heart disease Brother   . Heart failure Brother     CABG  AMI @ 49  . Heart disease Brother   . Heart failure Mother     pacemaker  . Heart disease Mother     pacemaker; CHF  . Hypertension Mother   . Heart failure Father     AMI  . Cancer Father 69    colon cancer  . Heart disease Father     mutliple AMIs  . GI problems      Family history malignancy, GI tract  . Cancer Maternal Grandmother     ovarian  . Heart disease Sister 56    cardiac stenting  . Heart disease Brother   . Heart disease Brother   . Breast cancer Cousin     Social History    Social History   Social History  . Marital Status: Married    Spouse Name: Austina Moceri  . Number of Children: 1  . Years of Education: 12   Occupational History  . disabled     28   DDD   Social History Main Topics  . Smoking status: Former Smoker -- 1.00 packs/day for 25 years    Types: Cigarettes    Start date: 11/05/1984    Quit date: 11/06/2006  . Smokeless tobacco: Never Used     Comment: quit 11/07  . Alcohol Use: No  . Drug Use: No  . Sexual Activity:    Partners: Male   Other Topics Concern  . Not on file   Social History Narrative   Marital status:  Married since 1992; second marriage; happily married; no abuse.      Children:  1 daughter; 3 stepchildren; 8 grandchildren; 2 gg.   Daughter in [place].  Husband's children in [place].      Employment:  Retired/disabled due to DDD lumbar.       Lives:   Lives with spouse;      Caffeine use cosumes a moderate amount 1 serving per day.      Always uses seat belts.       No guns in home.       Smoke alarm and carbon monoxide detectors in the home.       No Living Will.     Review of Systems    General:  No chills, fever, night sweats or weight changes.  Cardiovascular:  No chest pain, dyspnea on exertion, edema, orthopnea, + palpitations, paroxysmal  nocturnal dyspnea. Dermatological: No rash, lesions/masses Respiratory: No cough, dyspnea Urologic: No hematuria, dysuria Abdominal:  No nausea, vomiting, diarrhea, bright red blood per rectum, melena, or hematemesis Neurologic:  No visual changes, wkns, changes in mental status. All other systems reviewed and are otherwise negative except as noted above.  Physical Exam    Blood pressure 106/65, pulse 162, temperature 98.6 F (37 C), temperature source Oral, resp. rate 18, weight 206 lb 5.6 oz (93.6 kg), SpO2 92 %.  General: Pleasant, NAD Psych: Normal affect. Neuro: Alert and oriented X 3. Moves all extremities spontaneously. HEENT: Normal  Neck: Supple without bruits or JVD. Lungs:  Resp regular and unlabored, CTA. Heart: IRRR no s3, s4, or murmurs. Abdomen: Soft, non-tender, non-distended, BS + x 4.  Extremities: No clubbing, cyanosis or edema. DP/PT/Radials 2+ and equal bilaterally.  Labs    Troponin [facility] Care Test)  Recent Labs  09/09/15 2055  TROPIPOC 0.57*    Recent Labs  09/10/15 0132 09/10/15 0614 09/10/15 1316  CKTOTAL 350*  --   --   TROPONINI 0.38* 0.31* 0.20*   Lab Results  Component Value Date   WBC 9.6 09/12/2015   HGB 11.1* 09/12/2015   HCT 34.3* 09/12/2015   MCV 87.7 09/12/2015   PLT 180 09/12/2015    Recent Labs Lab 09/11/15 0250 09/12/15 0237  NA 141 144  K 3.3* 4.5  CL 110 113*  CO2 23 21*  BUN 12 7  CREATININE 0.57 0.52  CALCIUM 7.7* 8.2*  PROT 5.0*  --   BILITOT 0.6  --   ALKPHOS 61  --   ALT 610*  --   AST 486*  --   GLUCOSE 116* 68   Lab Results  Component Value Date   CHOL 197 12/01/2014   HDL 52 12/01/2014   LDLCALC 117 12/01/2014   TRIG 138 12/01/2014   No results found for: [facility]   Radiology Studies    Ct Head Wo Contrast  09/09/2015  CLINICAL DATA:  65 year old female with altered mental status. New prescription for morphine. Initial encounter. EXAM: CT HEAD WITHOUT CONTRAST TECHNIQUE: Contiguous axial  images were obtained from the base of the skull through the vertex without intravenous contrast. COMPARISON:  None. FINDINGS: No intracranial hemorrhage or CT evidence of large acute infarct. No intracranial mass lesion noted on this unenhanced exam. No hydrocephalus. Partial opacification left mastoid air cells. No acute orbital abnormality. IMPRESSION: No intracranial hemorrhage or CT evidence of large acute infarct. Minimal partial opacification left mastoid air cells. Electronically Signed   By: Genia Del M.D.   On: 09/09/2015 23:12   US Abdomen Limited  09/10/2015  CLINICAL DATA:  Fever.  Right upper quadrant pain. EXAM: US ABDOMEN LIMITED - RIGHT UPPER QUADRANT COMPARISON:  None. FINDINGS: Gallbladder: No gallstones or wall thickening visualized. No sonographic Murphy sign noted by sonographer. Common bile duct: Diameter: 2 mm Liver: Slight increased echogenicity. IMPRESSION: 1. The gallbladder is unremarkable on this study. 2. Mild hepatic steatosis. Electronically Signed   By: Dorise Bullion III M.D   On: 09/10/2015 02:33   Dg Chest Port 1 View  09/11/2015  CLINICAL DATA:  Respiratory failure. EXAM: PORTABLE CHEST 1 VIEW COMPARISON:  09/10/2015. FINDINGS: 0531 hours. The cardio pericardial silhouette is enlarged. Airspace disease in the right lung has become slightly more diffuse in the interval. There is some atelectasis or pneumonia medial left base. Tiny bilateral pleural effusions again noted. Cervical spinal stimulator again noted. Bilateral thoracolumbar fusion rods are evident with fracture of the right rod again noted. Telemetry leads overlie the chest. IMPRESSION: Focal right  mid lung airspace disease is become slightly more diffuse in the interval. Tiny bilateral pleural effusions. Electronically Signed   By: Misty Stanley M.D.   On: 09/11/2015 07:15   Dg Chest Port 1 View  09/10/2015  CLINICAL DATA:  65 year old female with shortness of breath and wet sound Ing cough. Query  aspiration. Initial encounter. EXAM: PORTABLE CHEST 1 VIEW COMPARISON:  09/09/2015, [facility] chest radiographs 06/21/2015 and earlier FINDINGS: Portable AP semi upright view at at 0908 hours. 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 Right abdomen generator device with cervical spinal stimulator re- demonstrated. Confluent subtotal right lung airspace opacity has not significantly changed from yesterday. Streaky and patchy opacity at the left lung base also is stable. No superimposed pneumothorax. Probable small pleural effusions. Stable cardiac size and mediastinal contours. Calcified aortic atherosclerosis. IMPRESSION: 1. Stable ventilation with subtotal right lung airspace opacity suspicious for aspiration and/or pneumonia in this setting. Lesser patchy opacity at the left lung base. 2. Probable small bilateral pleural effusions. 3. Stable visible posterior spine hardware. Cervical spinal stimulator device again noted. Electronically Signed   By: Genevie Ann M.D.   On: 09/10/2015 10:00   Dg Chest Portable 1 View  09/09/2015  CLINICAL DATA:  Altered mental status. EXAM: PORTABLE CHEST 1 VIEW COMPARISON:  None. FINDINGS: Mild cardiomegaly noted. Right lung airspace disease is identified. There is no evidence of pneumothorax. Left basilar first atelectasis versus scarring noted. Thoracic surgical hardware (right rod fracture at the T11 level) and probable cervical neurostimulator noted. IMPRESSION: Diffuse right lung airspace disease -question pneumonia/aspiration. Electronically Signed   By: Margarette Canada M.D.   On: 09/09/2015 21:47    EKG & Cardiac Imaging    EKG: Afib/RVR   Echocardiogram: 09/11/15 Study Conclusions  - Left ventricle: The cavity size was normal. Systolic function was  normal. The estimated ejection fraction was in the range of 50%  to 55%. Wall motion was normal; there were no regional wall  motion abnormalities. There  was an increased relative  contribution of atrial contraction to ventricular filling.  Doppler parameters are consistent with abnormal left ventricular  relaxation (grade 1 diastolic dysfunction). - Mitral valve: There was mild regurgitation. - Pulmonary arteries: PA peak pressure: 40 mm Hg (S). Left atrium: The atrium was at the upper limits of normal in size Impressions:  - The right ventricular systolic pressure was increased consistent  with moderate pulmonary hypertension.  Assessment & Plan  1. Paroxsymal atrial fibrillation: Developed Afib/RVR in setting of acute respiratory failure related to pneumonia. She initially converted with Amiodarone but redeveloped Afib this morning. Currently on a diltiazem drip, continue and titrate as needed. We will also add back the amiodarone gtt to see if this will convert her to NSR. She is asymptomatic, BP stable. She is on full dose Lovenox for anticoagulation. She will eventually need NOAC.   This patients CHA2DS2-VASc Score and unadjusted Ischemic Stroke Rate (% per year) is equal to 3.2 % stroke rate/year from a score of 3 Above score calculated as 1 point each if present [CHF, HTN, DM, Vascular=MI/PAD/Aortic Plaque, Age if 65-74, or Female], 2 points each if present [Age > 75, or Stroke/TIA/TE]  Note TSH is low at 0.238, but FT4 is normal, synthroid dose has been decreased per primary team.   2. HTN: Well controlled, BP stable on diltiazem gtt.   3. HLD: Last LDL was 117. Continue rosuvastatin.   4. Family history of CAD: Has  non ischemic Lexiscan Myoview in 2015, denies chest pain.      Signed, Arbutus Leas, NP 09/12/2015, 10:05 AM Pager: [phone] Patient seen and examined and history reviewed. Agree with above findings and plan. 65 yo WF admitted with altered mental status secondary to narcotics and sepsis with aspiration PNA. She was in AFib initially and converted with IV amiodarone. Amiodarone discontinued. This am  patient developed recurrent Afib with RVR. Now on IV cardizem at 15 mg/hr. She is minimally symptomatic. Patient just converted to NSR with rate 88 bpm.  Echo shows normal LV function with moderate pulmonary HTN. Troponin levels mildly elevated c/w demand ischemia in setting of sepsis, respiratory failure and AKI.  Since she just converted to NSR we will wean IV cardizem. Start po dose. If Afib continues to recur I would consider resuming amiodarone. Her Afib is likely triggered by her acute medical illness and PNA.  Pulmonary HTN on Echo may be related to COPD and/or obesity.  Currently of full anticoagulation with sQ Lovenox. Would consider starting oral anticoagulant therapy when OK with medical team.  Verba Ainley [place], [place] 09/12/2015 11:58 AM

## 2015-09-12 NOTE — Progress Notes (Signed)
ANTICOAGULATION CONSULT NOTE - Initial Consult  Pharmacy Consult for enoxaparin Indication: atrial fibrillation  Allergies  Allergen Reactions  . Latex Other (See Comments)    OPEN SORES  . Levofloxacin   . Sulfonamide Derivatives     Patient Measurements: Weight: 206 lb 5.6 oz (93.6 kg)  Vital Signs: Temp: 98.6 F (37 C) (07/19 0753) Temp Source: Oral (07/19 0753) BP: 107/84 mmHg (07/19 0800) Pulse Rate: 111 (07/19 0840)  Labs:  Recent Labs  09/10/15 0132 09/10/15 [id number] 09/10/15 1316 09/10/15 1812 09/11/15 0250 09/11/15 0610 09/11/15 1929 09/12/15 0237  HGB  --  11.4*  --   --   --  7.8* 10.9* 11.1*  HCT  --  39.9  --   --   --  25.9* 34.3* 34.3*  PLT  --  PLATELET CLUMPS NOTED ON SMEAR, COUNT APPEARS ADEQUATE  --   --   --  163 181 180  APTT 37  --   --   --   --   --   --   --   LABPROT 15.6*  --   --   --   --   --   --   --   INR 1.22  --   --   --   --   --   --   --   CREATININE  --  1.04*  --  0.74 0.57  --   --  0.52  CKTOTAL 350*  --   --   --   --   --   --   --   TROPONINI 0.38* 0.31* 0.20*  --   --   --   --   --     Estimated Creatinine Clearance: 76.7 mL/min (by C-G formula based on Cr of 0.52).  Assessment: 65 yo f presenting with encephalopathy  - found by husband to be unarousable  PMH: hypothyroid, depression, COPD on home O2, chronic back pain on opioids  Now with no onset a fib  Plan:  Enoxaparin 1mg /kg q12h  Levester Fresh, PharmD, BCPS, [facility] Clinical Pharmacist Pager [phone] 09/12/2015 9:11 AM

## 2015-09-12 NOTE — Progress Notes (Signed)
PT Cancellation Note  Patient Details Name: Sonya [name] MRN: [id number] DOB: 05-10-1950   Cancelled Treatment:    Reason Eval/Treat Not Completed: Fatigue/lethargy limiting ability to participate (Pt resting per nurse; has been up and down all day.)   Denice Paradise 09/12/2015, 4:06 PM  Aleana Fifita,PT Acute Rehabilitation [phone] (pager)

## 2015-09-12 NOTE — Progress Notes (Signed)
[place] TEAM 1 - Stepdown/ICU TEAM  Sonya [name]  [id number] DOB: 21-Nov-1950 DOA: 09/09/2015 PCP: Reginia Forts, MD    Brief Narrative:  65 yo F with Hx of Hypothyroid, depression, COPD on 2L nocturnal O2, Mild OSA (not on CPAP) and chronic back pain who has been on chronic narcotics for years but recently had her medications changed to long acting morphine. Following this medication change her family states she had been progressively altered and confused. The morning of her admit she was found by her husband unarousable.  He called 911. EMS found her O2 sats in the 74s.  She was given narcan with partial response per EMS and ED.   In the ED she was  found to have a large right sided infiltrate.  She was given fluid and CTX/azithro.  Significant Events: 7/16 admit  7/18 TTE EF 50-55% - no WMA - grade 1 DD 7/19 TRH assumed care   Subjective: The patient complains of indigestion all last night that resolved this morning with "nausea medicine."  She denies substernal chest pressure but does complain of dyspnea at rest.  She is not aware that her heart is racing.  She is alert and conversant though at times slow to respond to questioning.  She denies abdominal pain diarrhea or nausea or vomiting.  Assessment & Plan:  RLL Aspiration pneumonitis To complete 5 days of empiric abx tx  Acute on chronic hypoxemic respiratory failure Due to aspiration pneumonitis plus hypoventilation in setting of narcotic overdose - wean oxygen as able  Acute encephalopathy due to narcotics - resolved  Newly diagnosed Afib w/ RVR Patient denies being told of this diagnosis before - initiate Cardizem drip for rate control - change Lovenox to full dose for anticoagulation - Cardiology consulted  Elevated troponin No substernal chest pressure - suspect this is stress/demand related  Chronic COPD Well compensated at present  Mild/grade 1 diastolic CHF  Well compensated presently  Normocytic  anemia drop in hemoglobin from 11 > 8 appears to have been erroneous - hemoglobin appears stable at approximately 10-11 - check iron studies   Acute kidney injury - resolved baseline Scr 0.7 - creatinine has returned to normal with simple hydration  Transaminitis possible shock liver - slowly improving  Hypothyroid TSH low at 0.238 but FT4 normal at 1.06 - Synthroid dose has been decreased in the setting of atrial fibrillation with RVR  DVT prophylaxis: lovenox  Code Status: FULL CODE Family Communication: no family present at time of exam  Disposition Plan: Stable for SDU - address A. fib with RVR - mobilize  Consultants:  PCCM  Antimicrobials:  Unasyn 7/17 > Rocephin 7/16 Azithro 7/16  Objective: Blood pressure 107/84, pulse 111, temperature 98.6 F (37 C), temperature source Oral, resp. rate 24, weight 93.6 kg (206 lb 5.6 oz), SpO2 90 %.  Intake/Output Summary (Last 24 hours) at 09/12/15 0928 Last data filed at 09/12/15 0600  Gross per 24 hour  Intake 1334.22 ml  Output   1825 ml  Net -490.78 ml   Filed Weights   09/10/15 0459 09/11/15 0157 09/12/15 0446  Weight: 92.2 kg (203 lb 4.2 oz) 92.2 kg (203 lb 4.2 oz) 93.6 kg (206 lb 5.6 oz)    Examination: General: No acute respiratory distress at rest in bed Lungs: Poor air movement bilateral bases with no wheeze Cardiovascular: Tachycardic and irregularly irregular with heart beat approximately 1 30 bpm Abdomen: Nontender, nondistended, soft, bowel sounds positive, no rebound, no ascites, no appreciable mass Extremities:  No significant cyanosis, or clubbing - trace edema bilateral lower extremities  CBC:  Recent Labs Lab 09/09/15 2104 09/10/15 0614 09/11/15 0610 09/11/15 1929 09/12/15 0237  WBC 13.1* 11.1* 8.6 10.7* 9.6  HGB 12.5 11.4* 7.8* 10.9* 11.1*  HCT 39.9 39.9 25.9* 34.3* 34.3*  MCV 91.9 91.5 91.5 88.6 87.7  PLT 272 PLATELET CLUMPS NOTED ON SMEAR, COUNT APPEARS ADEQUATE 163 181 [id number]   Basic  Metabolic Panel:  Recent Labs Lab 09/09/15 2040 09/10/15 0132 09/10/15 0614 09/10/15 1812 09/11/15 0250 09/12/15 0237  NA 139  --  141 142 141 144  K 4.0  --  3.4* 3.5 3.3* 4.5  CL 105  --  108 110 110 113*  CO2 26  --  24 25 23  21*  GLUCOSE 120*  --  129* 100* 116* 68  BUN 24*  --  19 15 12 7   CREATININE 1.74*  --  1.04* 0.74 0.57 0.52  CALCIUM 8.7*  --  7.4* 8.1* 7.7* 8.2*  MG  --  1.9 2.1 2.1  --   --   PHOS  --  2.5 2.7  --   --   --    GFR: Estimated Creatinine Clearance: 76.7 mL/min (by C-G formula based on Cr of 0.52).  Liver Function Tests:  Recent Labs Lab 09/09/15 2040 09/10/15 0614 09/11/15 0250  AST 1558* 1009* 486*  ALT 776* 683* 610*  ALKPHOS 69 67 61  BILITOT 0.9 0.8 0.6  PROT 6.8 5.4* 5.0*  ALBUMIN 3.1* 2.8* 2.4*    Recent Labs Lab 09/09/15 2040  LIPASE 33    Recent Labs Lab 09/09/15 2102  AMMONIA 18    Coagulation Profile:  Recent Labs Lab 09/10/15 0132  INR 1.22    Cardiac Enzymes:  Recent Labs Lab 09/10/15 0132 09/10/15 0614 09/10/15 1316  CKTOTAL 350*  --   --   TROPONINI 0.38* 0.31* 0.20*    HbA1C: HGB A1C MFR BLD  Date/Time Value Ref Range Status  12/01/2014 12:27 PM 6.0* <5.7 % Final    Comment:                                                                           According to the ADA Clinical Practice Recommendations for 2011, when HbA1c is used as a screening test:     >=6.5%   Diagnostic of Diabetes Mellitus            (if abnormal result is confirmed)   5.7-6.4%   Increased risk of developing Diabetes Mellitus   References:Diagnosis and Classification of Diabetes Mellitus,Diabetes [id number] 1):S62-S69 and Standards of Medical Care in         Diabetes - 2011,Diabetes [id number] (Suppl 1):S11-S61.     05/24/2014 02:06 PM 5.9* <5.7 % Final    Comment:  According to the ADA Clinical Practice Recommendations for 2011,  when HbA1c is used as a screening test:     >=6.5%   Diagnostic of Diabetes Mellitus            (if abnormal result is confirmed)   5.7-6.4%   Increased risk of developing Diabetes Mellitus   References:Diagnosis and Classification of Diabetes Mellitus,Diabetes [id number] 1):S62-S69 and Standards of Medical Care in         Diabetes - 2011,Diabetes [id number] (Suppl 1):S11-S61.       CBG:  Recent Labs Lab 09/09/15 2054 09/10/15 0452  GLUCAP 104* 136*    Recent Results (from the past 240 hour(s))  Blood culture (routine x 2)     Status: None (Preliminary result)   Collection Time: 09/09/15  8:40 PM  Result Value Ref Range Status   Specimen Description BLOOD RIGHT ANTECUBITAL  Final   Special Requests IN PEDIATRIC BOTTLE 2.5CC  Final   Culture NO GROWTH 2 DAYS  Final   Report Status PENDING  Incomplete  Blood culture (routine x 2)     Status: None (Preliminary result)   Collection Time: 09/09/15  8:45 PM  Result Value Ref Range Status   Specimen Description BLOOD RIGHT ANTECUBITAL  Final   Special Requests BOTTLES DRAWN AEROBIC AND ANAEROBIC 5CC   Final   Culture NO GROWTH 2 DAYS  Final   Report Status PENDING  Incomplete  MRSA PCR Screening     Status: None   Collection Time: 09/10/15  4:59 AM  Result Value Ref Range Status   MRSA by PCR NEGATIVE NEGATIVE Final    Comment:        The GeneXpert MRSA Assay (FDA approved for NASAL specimens only), is one component of a comprehensive MRSA colonization surveillance program. It is not intended to diagnose MRSA infection nor to guide or monitor treatment for MRSA infections.      Scheduled Meds: . ampicillin-sulbactam (UNASYN) IV  1.5 g Intravenous Q8H  . antiseptic oral rinse  7 mL Mouth Rinse q12n4p  . aspirin EC  325 mg Oral Once  . budesonide  0.25 mg Inhalation BID  . chlorhexidine  15 mL Mouth Rinse BID  . enoxaparin (LOVENOX) injection  1 mg/kg Subcutaneous Q12H  . levalbuterol  1.25 mg  Nebulization TID  . levothyroxine  25 mcg Oral QAC breakfast   Continuous Infusions: . diltiazem (CARDIZEM) infusion 5 mg/hr (09/12/15 0919)  . sodium chloride 0.9 % 1,000 mL infusion 50 mL/hr at 09/12/15 0009     LOS: 2 days   Time spent: 35 minutes   Cherene Altes, MD [facility] Office  [phone] Pager - Text Page per Amion as per below:  On-Call/Text Page:      [email or web address]      password [email or web address]  If 7PM-7AM, please contact night-coverage [email or web address] Password [facility] 09/12/2015, 9:28 AM

## 2015-09-13 DIAGNOSIS — I480 Paroxysmal atrial fibrillation: Secondary | ICD-10-CM | POA: Diagnosis present

## 2015-09-13 DIAGNOSIS — E038 Other specified hypothyroidism: Secondary | ICD-10-CM | POA: Diagnosis present

## 2015-09-13 DIAGNOSIS — I4891 Unspecified atrial fibrillation: Secondary | ICD-10-CM | POA: Diagnosis present

## 2015-09-13 DIAGNOSIS — J690 Pneumonitis due to inhalation of food and vomit: Secondary | ICD-10-CM | POA: Diagnosis present

## 2015-09-13 DIAGNOSIS — J449 Chronic obstructive pulmonary disease, unspecified: Secondary | ICD-10-CM | POA: Diagnosis present

## 2015-09-13 DIAGNOSIS — E876 Hypokalemia: Secondary | ICD-10-CM | POA: Diagnosis present

## 2015-09-13 LAB — CBC
HEMATOCRIT: 35.3 % — AB (ref 36.0–46.0)
HEMOGLOBIN: 10.7 g/dL — AB (ref 12.0–15.0)
MCH: 27.4 pg (ref 26.0–34.0)
MCHC: 30.3 g/dL (ref 30.0–36.0)
MCV: 90.5 fL (ref 78.0–100.0)
Platelets: 177 10*3/uL (ref 150–400)
RBC: 3.9 MIL/uL (ref 3.87–5.11)
RDW: 15.9 % — ABNORMAL HIGH (ref 11.5–15.5)
WBC: 7.3 10*3/uL (ref 4.0–10.5)

## 2015-09-13 LAB — COMPREHENSIVE METABOLIC PANEL
ALBUMIN: 2.4 g/dL — AB (ref 3.5–5.0)
ALK PHOS: 66 U/L (ref 38–126)
ALT: 351 U/L — ABNORMAL HIGH (ref 14–54)
ANION GAP: 6 (ref 5–15)
AST: 122 U/L — ABNORMAL HIGH (ref 15–41)
BUN: 8 mg/dL (ref 6–20)
CALCIUM: 7.9 mg/dL — AB (ref 8.9–10.3)
CHLORIDE: 110 mmol/L (ref 101–111)
CO2: 25 mmol/L (ref 22–32)
Creatinine, Ser: 0.61 mg/dL (ref 0.44–1.00)
GFR calc non Af Amer: >60 mL/min (ref >60)
GLUCOSE: 89 mg/dL (ref 65–99)
Potassium: 2.8 mmol/L — ABNORMAL LOW (ref 3.5–5.1)
SODIUM: 141 mmol/L (ref 135–145)
Total Bilirubin: 1 mg/dL (ref 0.3–1.2)
Total Protein: 4.9 g/dL — ABNORMAL LOW (ref 6.5–8.1)

## 2015-09-13 LAB — FERRITIN: Ferritin: 86 ng/mL (ref 11–307)

## 2015-09-13 LAB — MAGNESIUM: MAGNESIUM: 1.9 mg/dL (ref 1.7–2.4)

## 2015-09-13 LAB — FOLATE: FOLATE: 9.2 ng/mL (ref >5.9)

## 2015-09-13 LAB — RETICULOCYTES
RBC.: 3.9 MIL/uL (ref 3.87–5.11)
RETIC CT PCT: 2.8 % (ref 0.4–3.1)
Retic Count, Absolute: 109.2 10*3/uL (ref 19.0–186.0)

## 2015-09-13 LAB — IRON AND TIBC
IRON: 41 ug/dL (ref 28–170)
Saturation Ratios: 14 % (ref 10.4–31.8)
TIBC: 298 ug/dL (ref 250–450)
UIBC: 257 ug/dL

## 2015-09-13 LAB — VITAMIN B12: VITAMIN B 12: 439 pg/mL (ref 180–914)

## 2015-09-13 LAB — POTASSIUM: Potassium: 3.1 mmol/L — ABNORMAL LOW (ref 3.5–5.1)

## 2015-09-13 MED ORDER — RIVAROXABAN 20 MG PO TABS
20.0000 mg | ORAL_TABLET | Freq: Every day | ORAL | Status: DC
  Administered 2015-09-13: 20 mg via ORAL
  Filled 2015-09-13: qty 1

## 2015-09-13 MED ORDER — POTASSIUM CHLORIDE CRYS ER 20 MEQ PO TBCR
40.0000 meq | EXTENDED_RELEASE_TABLET | ORAL | Status: AC
  Administered 2015-09-13 (×2): 40 meq via ORAL
  Filled 2015-09-13 (×2): qty 2

## 2015-09-13 MED ORDER — DILTIAZEM HCL ER COATED BEADS 240 MG PO CP24
240.0000 mg | ORAL_CAPSULE | Freq: Every day | ORAL | Status: DC
  Administered 2015-09-13 – 2015-09-14 (×2): 240 mg via ORAL
  Filled 2015-09-13 (×2): qty 1

## 2015-09-13 MED ORDER — METHYLPREDNISOLONE SODIUM SUCC 125 MG IJ SOLR
60.0000 mg | Freq: Two times a day (BID) | INTRAMUSCULAR | Status: AC
  Administered 2015-09-13 – 2015-09-14 (×2): 60 mg via INTRAVENOUS
  Filled 2015-09-13 (×2): qty 2

## 2015-09-13 NOTE — Progress Notes (Signed)
[facility] ADULT ICU REPLACEMENT PROTOCOL FOR AM LAB REPLACEMENT ONLY  The patient does apply for the [facility] Adult ICU Electrolyte Replacment Protocol based on the criteria listed below:   1. Is GFR >/= 40 ml/min? Yes.    Patient's GFR today is >60 2. Is urine output >/= 0.5 ml/kg/hr for the last 6 hours? Yes.   Patient's UOP is 0.8 ml/kg/hr 3. Is BUN < 60 mg/dL? Yes.    Patient's BUN today is 8 4. Abnormal electrolyte(s): Potassium 2.8 5. Ordered repletion with: Potassium per protocol 6. If a panic level lab has been reported, has the CCM MD in charge been notified? Yes.  .   Physician:  Hilton Cork, Kimberely Mccannon P 09/13/2015 3:47 AM

## 2015-09-13 NOTE — Progress Notes (Signed)
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 Since this medication change her family states she has been progressively altered, and confused. This morning she was found by her husband to be minimally arousible and he left her in bed to for to sleep. She was then unarousible and he called 911. EMS found her O2 sats in the 70s she was given narcan with partial response per EMS and ED.   In the ED found to have a large right sided infiltrate, given fluid and CTX/azithro.   Assessment & Plan:   Active Problems:   Acute hypoxemic respiratory failure (HCC)   Pressure ulcer   Acute encephalopathy   Aspiration pneumonia of right middle lobe (HCC)   Atrial fibrillation (HCC)   Atrial fibrillation with RVR (HCC)   Aspiration pneumonia of right lower lobe due to vomit (HCC)   Chronic bronchitis with COPD (chronic obstructive pulmonary disease) (HCC)   Other specified hypothyroidism   Hypokalemia    RLL Aspiration pneumonitis -To complete 5 days of empiric abx tx -Xopenex TID  -Solu-Medrol 60 mg BID 2 doses  Acute on chronic hypoxemic respiratory failure -Due to aspiration pneumonitis plus hypoventilation in setting of narcotic overdose  - Titrate O2 to maintain SPO2 89- 93%   Acute encephalopathy due to narcotics  - resolved  Newly diagnosed Afib w/ RVR -Patient recalls feeling a flutter in her chest also occasions previously, but never officially diagnosed with A. Fib. -Currently in NSR -Cardizem 240 mg daily -Xarelto per pharmacy - Patient follow-up with her regular cardiologist at [facility] 1-2 weeks  postdischarge.    Elevated troponin -No substernal chest pressure - suspect this is stress/demand related  Chronic COPD -Well compensated at present  Mild/grade 1 diastolic CHF  -Well compensated presently  Normocytic anemia drop in hemoglobin from 11 > 8 appears to have been erroneous   Recent Labs Lab 09/10/15 0614 09/11/15 0610 09/11/15 1929 09/12/15 0237 09/13/15 0218  HGB 11.4* 7.8* 10.9* 11.1* 10.7*  -Stable - check iron studies   Acute kidney injury(baseline Cr 0.7)  Lab Results  Component Value Date   CREATININE 0.61 09/13/2015   CREATININE 0.52 09/12/2015   CREATININE 0.57 09/11/2015  - resolved  Transaminitis -possible shock liver - slowly improving  Hypothyroid -TSH low at 0.238 but FT4 normal at 1.06 - Synthroid dose has been decreased in the setting of atrial fibrillation with RVR  Hypokalemia -Potassium goal> 4 -Potassium 40 mEq 2 doses -Recheck K/Mg at 1400    DVT prophylaxis: Lovenox--> Xarelto Code Status: Full Family Communication: Family present Disposition Plan: Discharge in next 24-48 hours   Consultants:  Cardiology PC CM    Procedures/Significant Events:  7/16 admit  7/18 TTE EF 50-55% - no WMA - grade 1 DD 7/19 TRH assumed care   Cultures 7/16 blood right AC 2 NGTD 7/17 MRSA by PCR negative 7/17 strep pneumo/Legionella urine antigen negative  Antimicrobials: Unasyn 7/17 >> Rocephin 7/16>> 7/16 Azithro 7/16>> 7/16    Devices    LINES / TUBES:      Continuous Infusions: . sodium chloride 0.9 % 1,000 mL infusion 50 mL/hr at 09/12/15 0009  Subjective: 7/20 A/O 4, states ambulated around the port without difficulty. Uses O2 mostly at night. States had multiple sleep studies which were negative for OSA. States has in the past had feelings of flutter in her chest, but never been diagnosed with atrial fibrillation.    Objective: Filed Vitals:   09/13/15 0800 09/13/15 0821 09/13/15 0900 09/13/15  1149  BP:  135/79 98/80   Pulse:  68 82   Temp:    98.3 F (36.8 C)  TempSrc:    Oral  Resp: 19 21 20    Weight:      SpO2:  96% 95%     Intake/Output Summary (Last 24 hours) at 09/13/15 1252 Last data filed at 09/13/15 [id number]  Gross per 24 hour  Intake   1495 ml  Output    975 ml  Net    520 ml   Filed Weights   09/11/15 0157 09/12/15 0446 09/13/15 0114  Weight: 92.2 kg (203 lb 4.2 oz) 93.6 kg (206 lb 5.6 oz) 94.2 kg (207 lb 10.8 oz)    Examination:  General: A/O x 4, sitting in chair comfortably, No acute respiratory distress Eyes: negative scleral hemorrhage, negative anisocoria, negative icterus ENT: Negative Runny nose, negative gingival bleeding, Neck:  Negative scars, masses, torticollis, lymphadenopathy, JVD Lungs: Clear to auscultation bilaterally positive expiratory wheezes, negative crackles Cardiovascular: Regular rate and rhythm without murmur gallop or rub normal S1 and S2 Abdomen: negative abdominal pain, nondistended, positive soft, bowel sounds, no rebound, no ascites, no appreciable mass Extremities: No significant cyanosis, clubbing, or edema bilateral lower extremities Skin: Negative rashes, lesions, ulcers Psychiatric:  Negative depression, negative anxiety, negative fatigue, negative mania  Central nervous system:  Cranial nerves II through XII intact, tongue/uvula midline, all extremities muscle strength 5/5, sensation intact throughout, negative dysarthria, negative expressive aphasia, negative receptive aphasia.  .     Data Reviewed: Care during the described time interval was provided by me .  I have reviewed this patient's available data, including medical history, events of note, physical examination, and all test results as part of my evaluation. I have personally reviewed and interpreted all radiology studies.  CBC:  Recent Labs Lab 09/10/15 0614 09/11/15 0610 09/11/15 1929 09/12/15 0237 09/13/15 0218  WBC 11.1* 8.6 10.7* 9.6 7.3  HGB  11.4* 7.8* 10.9* 11.1* 10.7*  HCT 39.9 25.9* 34.3* 34.3* 35.3*  MCV 91.5 91.5 88.6 87.7 90.5  PLT PLATELET CLUMPS NOTED ON SMEAR, COUNT APPEARS ADEQUATE 163 181 180 [id number]   Basic Metabolic Panel:  Recent Labs Lab 09/10/15 0132 09/10/15 0614 09/10/15 1812 09/11/15 0250 09/12/15 0237 09/13/15 0218  NA  --  141 142 141 144 141  K  --  3.4* 3.5 3.3* 4.5 2.8*  CL  --  108 110 110 113* 110  CO2  --  24 25 23  21* 25  GLUCOSE  --  129* 100* 116* 68 89  BUN  --  19 15 12 7 8   CREATININE  --  1.04* 0.74 0.57 0.52 0.61  CALCIUM  --  7.4* 8.1* 7.7* 8.2* 7.9*  MG 1.9 2.1 2.1  --   --   --   PHOS 2.5 2.7  --   --   --   --    GFR: Estimated Creatinine Clearance: 76.9 mL/min (by C-G formula based on Cr of 0.61). Liver Function Tests:  Recent Labs Lab 09/09/15 2040 09/10/15 0614 09/11/15 0250 09/13/15 0218  AST 1558* 1009* 486* 122*  ALT 776* 683* 610*  351*  ALKPHOS 69 67 61 66  BILITOT 0.9 0.8 0.6 1.0  PROT 6.8 5.4* 5.0* 4.9*  ALBUMIN 3.1* 2.8* 2.4* 2.4*    Recent Labs Lab 09/09/15 2040  LIPASE 33    Recent Labs Lab 09/09/15 2102  AMMONIA 18   Coagulation Profile:  Recent Labs Lab 09/10/15 0132  INR 1.22   Cardiac Enzymes:  Recent Labs Lab 09/10/15 0132 09/10/15 0614 09/10/15 1316  CKTOTAL 350*  --   --   TROPONINI 0.38* 0.31* 0.20*   BNP (last 3 results) No results for input(s): PROBNP in the last 8760 hours. HbA1C: No results for input(s): HGBA1C in the last 72 hours. CBG:  Recent Labs Lab 09/09/15 2054 09/10/15 0452  GLUCAP 104* 136*   Lipid Profile: No results for input(s): CHOL, HDL, LDLCALC, TRIG, CHOLHDL, LDLDIRECT in the last 72 hours. Thyroid Function Tests: No results for input(s): TSH, T4TOTAL, FREET4, T3FREE, THYROIDAB in the last 72 hours. Anemia Panel:  Recent Labs  09/13/15 0218  VITAMINB12 439  FOLATE 9.2  FERRITIN 86  TIBC 298  IRON 41  RETICCTPCT 2.8   Urine analysis:    Component Value Date/Time   COLORURINE  YELLOW 09/09/2015 2242   COLORURINE Yellow 12/01/2013 1015   APPEARANCEUR CLOUDY* 09/09/2015 2242   APPEARANCEUR Clear 12/01/2013 1015   LABSPEC 1.018 09/09/2015 2242   LABSPEC 1.021 12/01/2013 1015   PHURINE 5.5 09/09/2015 2242   PHURINE 5.0 12/01/2013 1015   GLUCOSEU NEGATIVE 09/09/2015 2242   GLUCOSEU Negative 12/01/2013 1015   HGBUR LARGE* 09/09/2015 2242   HGBUR Negative 12/01/2013 1015   BILIRUBINUR NEGATIVE 09/09/2015 2242   BILIRUBINUR neg 05/24/2014 1430   BILIRUBINUR Negative 12/01/2013 1015   KETONESUR 15* 09/09/2015 2242   KETONESUR Negative 12/01/2013 1015   PROTEINUR 100* 09/09/2015 2242   PROTEINUR neg 05/24/2014 1430   PROTEINUR Negative 12/01/2013 1015   UROBILINOGEN 0.2 05/24/2014 1430   NITRITE NEGATIVE 09/09/2015 2242   NITRITE neg 05/24/2014 1430   NITRITE Negative 12/01/2013 1015   LEUKOCYTESUR SMALL* 09/09/2015 2242   LEUKOCYTESUR Negative 12/01/2013 1015   Sepsis Labs: @LABRCNTIP (procalcitonin:4,lacticidven:4)  ) Recent Results (from the past 240 hour(s))  Blood culture (routine x 2)     Status: None (Preliminary result)   Collection Time: 09/09/15  8:40 PM  Result Value Ref Range Status   Specimen Description BLOOD RIGHT ANTECUBITAL  Final   Special Requests IN PEDIATRIC BOTTLE 2.5CC  Final   Culture NO GROWTH 3 DAYS  Final   Report Status PENDING  Incomplete  Blood culture (routine x 2)     Status: None (Preliminary result)   Collection Time: 09/09/15  8:45 PM  Result Value Ref Range Status   Specimen Description BLOOD RIGHT ANTECUBITAL  Final   Special Requests BOTTLES DRAWN AEROBIC AND ANAEROBIC 5CC   Final   Culture NO GROWTH 3 DAYS  Final   Report Status PENDING  Incomplete  MRSA PCR Screening     Status: None   Collection Time: 09/10/15  4:59 AM  Result Value Ref Range Status   MRSA by PCR NEGATIVE NEGATIVE Final    Comment:        The GeneXpert MRSA Assay (FDA approved for NASAL specimens only), is one component of a comprehensive  MRSA colonization surveillance program. It is not intended to diagnose MRSA infection nor to guide or monitor treatment for MRSA infections.          Radiology Studies: No results found.  Scheduled Meds: . ampicillin-sulbactam (UNASYN) IV  1.5 g Intravenous Q8H  . antiseptic oral rinse  7 mL Mouth Rinse q12n4p  . budesonide  0.25 mg Inhalation BID  . chlorhexidine  15 mL Mouth Rinse BID  . clonazePAM  0.5 mg Oral BID  . diltiazem  240 mg Oral Daily  . DULoxetine  60 mg Oral Daily  . enoxaparin (LOVENOX) injection  1 mg/kg Subcutaneous Q12H  . levalbuterol  1.25 mg Nebulization TID  . levothyroxine  25 mcg Oral QAC breakfast  . methylPREDNISolone (SOLU-MEDROL) injection  60 mg Intravenous Q12H  . pantoprazole  40 mg Oral Q1200  . rosuvastatin  20 mg Oral q1800   Continuous Infusions: . sodium chloride 0.9 % 1,000 mL infusion 50 mL/hr at 09/12/15 0009     LOS: 3 days    Time spent: 40 minutes    WOODS, Geraldo Docker, MD [facility] Pager [phone]   If 7PM-7AM, please contact night-coverage [email or web address] Password [email or web address] 09/13/2015, 12:52 PM

## 2015-09-13 NOTE — Progress Notes (Signed)
TELEMETRY: Reviewed telemetry pt in NSR. She has been in and out of Afib with controlled rate: Filed Vitals:   09/13/15 0500 09/13/15 0507 09/13/15 0600 09/13/15 0756  BP: 109/78 109/78 126/83   Pulse: 71  87   Temp:    98.9 F (37.2 C)  TempSrc:    Oral  Resp: 20  19   Weight:      SpO2: 93%  92%     Intake/Output Summary (Last 24 hours) at 09/13/15 0804 Last data filed at 09/13/15 [id number]  Gross per 24 hour  Intake 1416.83 ml  Output   1875 ml  Net -458.17 ml   Filed Weights   09/11/15 0157 09/12/15 0446 09/13/15 0114  Weight: 203 lb 4.2 oz (92.2 kg) 206 lb 5.6 oz (93.6 kg) 207 lb 10.8 oz (94.2 kg)    Subjective Patient does note palpitations with Afib. No chest pain. Breathing is better.  Marland Kitchen ampicillin-sulbactam (UNASYN) IV  1.5 g Intravenous Q8H  . antiseptic oral rinse  7 mL Mouth Rinse q12n4p  . aspirin EC  81 mg Oral Daily  . budesonide  0.25 mg Inhalation BID  . chlorhexidine  15 mL Mouth Rinse BID  . clonazePAM  0.5 mg Oral BID  . diltiazem  60 mg Oral Q8H  . DULoxetine  60 mg Oral Daily  . enoxaparin (LOVENOX) injection  1 mg/kg Subcutaneous Q12H  . levalbuterol  1.25 mg Nebulization TID  . levothyroxine  25 mcg Oral QAC breakfast  . pantoprazole  40 mg Oral Q1200  . rosuvastatin  20 mg Oral q1800   . sodium chloride 0.9 % 1,000 mL infusion 50 mL/hr at 09/12/15 0009    LABS: Basic Metabolic Panel:  Recent Labs  09/10/15 1812  09/12/15 0237 09/13/15 0218  NA 142  < > 144 141  K 3.5  < > 4.5 2.8*  CL 110  < > 113* 110  CO2 25  < > 21* 25  GLUCOSE 100*  < > 68 89  BUN 15  < > 7 8  CREATININE 0.74  < > 0.52 0.61  CALCIUM 8.1*  < > 8.2* 7.9*  MG 2.1  --   --   --   < > = values in this interval not displayed. Liver Function Tests:  Recent Labs  09/11/15 0250 09/13/15 0218  AST 486* 122*  ALT 610* 351*  ALKPHOS 61 66  BILITOT 0.6 1.0  PROT 5.0* 4.9*  ALBUMIN 2.4* 2.4*   No results for input(s): LIPASE, AMYLASE in the last 72  hours. CBC:  Recent Labs  09/12/15 0237 09/13/15 0218  WBC 9.6 7.3  HGB 11.1* 10.7*  HCT 34.3* 35.3*  MCV 87.7 90.5  PLT 180 177   Cardiac Enzymes:  Recent Labs  09/10/15 1316  TROPONINI 0.20*   BNP: No results for input(s): PROBNP in the last 72 hours. D-Dimer: No results for input(s): DDIMER in the last 72 hours. Hemoglobin A1C: No results for input(s): HGBA1C in the last 72 hours. Fasting Lipid Panel: No results for input(s): CHOL, HDL, LDLCALC, TRIG, CHOLHDL, LDLDIRECT in the last 72 hours. Thyroid Function Tests: No results for input(s): TSH, T4TOTAL, T3FREE, THYROIDAB in the last 72 hours.  Invalid input(s): FREET3   Radiology/Studies:  No results found.  PHYSICAL EXAM General: Pleasant, NAD Psych: Normal affect. Neuro: Alert and oriented X 3. Moves all extremities spontaneously. HEENT: Normal Neck: Supple without bruits or JVD. Lungs: Resp regular and unlabored, CTA. Heart: RRR no  s3, s4, or murmurs. Abdomen: Soft, non-tender, non-distended, BS + x 4.  Extremities: No clubbing, cyanosis or edema. DP/PT/Radials 2+ and equal bilaterally.  ASSESSMENT AND PLAN: 1. Paroxysmal Afib. Rate now controlled on po cardizem. Will switch to Cardizem CD today. Afib most likely triggered by respiratory failure, beta agonists,  and PNA. Hopefully as respiratory status improves Afib will decrease. If it becomes more persistent may need to consider antiarrhythmic drug therapy. Recommend anticoagulation with NOAC when OK with primary team. Currently on Lovenox- full dose. Would discontinue ASA.  2. HTN controlled.  3. PNA  4. HLD on crestor.   5. Acute respiratory failure/PNA- per primary team. On nebs, steroids and antibiotics. Hopefully can reduce beta agonist use with AFib.   6. Hypokalemia- need to replete  Present on Admission:  . Acute hypoxemic respiratory failure ([place])  Signed, Sonya [name], Ernest 09/13/2015 8:04 AM

## 2015-09-13 NOTE — Care Management Note (Signed)
Case Management Note  Patient Details  Name: Shagun Coomer MRN: [id number] Date of Birth: [birth year]  Subjective/Objective:                    Action/Plan:  Consult for Xarelto cards .  Gave patient 30 day free trail card and $0 co pay card , explained same , patient voiced understanding .   PT recommending home health PT / OT and 24 hour supervision. Patient states she lives with her husband and he can provide 24 hour supervision and assistance.   Confirmed face sheet information with patient. Provided a list of home health agencies . Patient has had home health in past will discuss with her husband .   Will need MD order for HHPT and [place] .    Expected Discharge Date:                  Expected Discharge Plan:  [place]  In-House Referral:     Discharge planning Services  CM Consult, Medication Assistance  Post Acute Care Choice:    Choice offered to:  Patient  DME Arranged:    DME Agency:     HH Arranged:    [place] Agency:     Status of Service:  In process, will continue to follow  If discussed at Long Length of Stay Meetings, dates discussed:    Additional Comments:  Marilu Favre, RN 09/13/2015, 2:20 PM

## 2015-09-13 NOTE — Progress Notes (Signed)
RT instructed pt on the use of a flutter valve. Patient was able to demonstrate back good technique.

## 2015-09-13 NOTE — Progress Notes (Signed)
ANTICOAGULATION CONSULT NOTE - Initial Consult  Pharmacy Consult for enoxaparin Indication: atrial fibrillation  Allergies  Allergen Reactions  . Latex Other (See Comments)    OPEN SORES  . Levofloxacin   . Sulfonamide Derivatives     Patient Measurements: Weight: 207 lb 10.8 oz (94.2 kg)  Vital Signs: Temp: 98.3 F (36.8 C) (07/20 1149) Temp Source: Oral (07/20 1149) BP: 98/80 mmHg (07/20 0900) Pulse Rate: 82 (07/20 0900)  Labs:  Recent Labs  09/10/15 1316  09/11/15 0250  09/11/15 1929 09/12/15 0237 09/13/15 0218  HGB  --   --   --   < > 10.9* 11.1* 10.7*  HCT  --   --   --   < > 34.3* 34.3* 35.3*  PLT  --   --   --   < > 181 180 177  CREATININE  --   < > 0.57  --   --  0.52 0.61  TROPONINI 0.20*  --   --   --   --   --   --   < > = values in this interval not displayed.  Estimated Creatinine Clearance: 76.9 mL/min (by C-G formula based on Cr of 0.61).  Assessment: 65 yo f presenting with encephalopathy  - found by husband to be unarousable  PMH: hypothyroid, depression, COPD on home O2, chronic back pain on opioids  Now with no onset a fib  Plan:  D/C Enoxaparin Initiate xarelto 20 mg with dinner   Levester Fresh, PharmD, BCPS, [facility] Clinical Pharmacist Pager [phone] 09/13/2015 12:58 PM

## 2015-09-13 NOTE — Evaluation (Signed)
Physical Therapy Evaluation Patient Details Name: Sonya [name] MRN: [id number] DOB: 1950-12-24 Today's Date: 09/13/2015   History of Present Illness  Sonya [name] is a 65 year old female with a past medical history of hypothyroidism, depression, COPD, mild OSA (not on CPAP), and chronic back pain. Admitted with altered mental status and lethargy secondary to narcotic use. [place], cardiology consulted.   Clinical Impression  Pt admitted with above diagnosis. Pt currently with functional limitations due to the deficits listed below (see PT Problem List). Pt was able to ambulate on unit with RW.  Should progress well.  Supportive family.  Pt will benefit from skilled PT to increase their independence and safety with mobility to allow discharge to the venue listed below.    Follow Up Recommendations Home health PT;Supervision/Assistance - 24 hour (HHOT)    Equipment Recommendations  None recommended by PT    Recommendations for Other Services OT consult     Precautions / Restrictions Precautions Precautions: Fall Restrictions Weight Bearing Restrictions: No      Mobility  Bed Mobility               General bed mobility comments: In chair on arrival.  Transfers Overall transfer level: Needs assistance Equipment used: Rolling walker (2 wheeled) Transfers: Sit to/from Stand Sit to Stand: Min assist         General transfer comment: Needed steadying assist upon standing.   Ambulation/Gait Ambulation/Gait assistance: Min guard Ambulation Distance (Feet): 280 Feet Assistive device: Rolling walker (2 wheeled) Gait Pattern/deviations: Step-through pattern;Decreased stride length   Gait velocity interpretation: <1.8 ft/sec, indicative of risk for recurrent falls General Gait Details: Pt with fairly steady gait with RW.  No LOB in controlled environment.    Stairs            Wheelchair Mobility    Modified Rankin (Stroke Patients Only)        Balance Overall balance assessment: Needs assistance Sitting-balance support: No upper extremity supported;Feet supported Sitting balance-Leahy Scale: Fair     Standing balance support: Bilateral upper extremity supported;During functional activity Standing balance-Leahy Scale: Poor Standing balance comment: relies on RW for balance at present time.                              Pertinent Vitals/Pain Pain Assessment: No/denies painO2 on RA was 88-89%.  Replace O2 at 4L with O2 at 94-96%.  Had to incr to 6LO2 for ambulation with sats at 92-94%.  HR 82-95bpm.  BP 98/80 initially and then to 120/75 after treatment.     Home Living Family/patient expects to be discharged to:: Private residence Living Arrangements: Spouse/significant other Available Help at Discharge: Family;Available 24 hours/day Type of Home: House Home Access: Stairs to enter Entrance Stairs-Rails: Right Entrance Stairs-Number of Steps: 2 Home Layout: One level Home Equipment: Walker - 2 wheels;Bedside commode;Shower seat (home O2)      Prior Function Level of Independence: Independent               Hand Dominance        Extremity/Trunk Assessment   Upper Extremity Assessment: Defer to OT evaluation           Lower Extremity Assessment: Generalized weakness         Communication   Communication: No difficulties  Cognition Arousal/Alertness: Awake/alert Behavior During Therapy: WFL for tasks assessed/performed Overall Cognitive Status: Within Functional Limits for tasks assessed  General Comments      Exercises General Exercises - Lower Extremity Long Arc Quad: AROM;Both;10 reps;Seated      Assessment/Plan    PT Assessment Patient needs continued PT services  PT Diagnosis Generalized weakness   PT Problem List Decreased mobility;Decreased balance;Decreased activity tolerance;Decreased knowledge of use of DME;Decreased safety  awareness;Decreased knowledge of precautions  PT Treatment Interventions DME instruction;Gait training;Functional mobility training;Therapeutic activities;Therapeutic exercise;Balance training;Stair training;Patient/family education   PT Goals (Current goals can be found in the Care Plan section) Acute Rehab PT Goals Patient Stated Goal: to go home PT Goal Formulation: With patient Time For Goal Achievement: 09/27/15 Potential to Achieve Goals: Good    Frequency Min 3X/week   Barriers to discharge        Co-evaluation               End of Session Equipment Utilized During Treatment: Gait belt;Oxygen Activity Tolerance: Patient limited by fatigue Patient left: in bed;with call bell/phone within reach;with bed alarm set;with family/visitor present Nurse Communication: Mobility status         Time: [id number] PT Time Calculation (min) (ACUTE ONLY): 33 min   Charges:   PT Evaluation $PT Eval Moderate Complexity: 1 Procedure PT Treatments $Gait Training: 8-22 mins   PT G CodesDenice Paradise 09/20/2015, 12:50 PM [employer] Acute Rehabilitation [phone] (pager)

## 2015-09-13 NOTE — Progress Notes (Signed)
CSW consult acknowledged. CSW will pass along to RN Case Manager re: "Requires Xarelto medication card, and evaluation for Xarelto discount card" as CSW does not assist with medication assistance.      Emiliano Dyer, LCSW [facility] ED/72M Clinical Social Worker [phone]

## 2015-09-14 ENCOUNTER — Inpatient Hospital Stay (HOSPITAL_COMMUNITY): Payer: BLUE CROSS/BLUE SHIELD

## 2015-09-14 DIAGNOSIS — E876 Hypokalemia: Secondary | ICD-10-CM

## 2015-09-14 LAB — CULTURE, BLOOD (ROUTINE X 2)
CULTURE: NO GROWTH
Culture: NO GROWTH

## 2015-09-14 MED ORDER — RIVAROXABAN 20 MG PO TABS: 20.0000 mg | ORAL_TABLET | Freq: Every day | ORAL | Status: DC

## 2015-09-14 MED ORDER — LEVALBUTEROL TARTRATE 45 MCG/ACT IN AERO: 2.0000 | INHALATION_SPRAY | Freq: Four times a day (QID) | RESPIRATORY_TRACT | Status: DC | PRN

## 2015-09-14 MED ORDER — BACLOFEN 10 MG PO TABS: 5.0000 mg | ORAL_TABLET | Freq: Two times a day (BID) | ORAL | Status: DC

## 2015-09-14 MED ORDER — PANTOPRAZOLE SODIUM 40 MG PO TBEC: 40.0000 mg | DELAYED_RELEASE_TABLET | Freq: Every day | ORAL | Status: DC

## 2015-09-14 MED ORDER — AMOXICILLIN-POT CLAVULANATE 875-125 MG PO TABS: 1.0000 | ORAL_TABLET | Freq: Two times a day (BID) | ORAL | Status: DC

## 2015-09-14 MED ORDER — DILTIAZEM HCL ER COATED BEADS 240 MG PO CP24: 240.0000 mg | ORAL_CAPSULE | Freq: Every day | ORAL | Status: DC

## 2015-09-14 MED ORDER — CLONAZEPAM 0.5 MG PO TABS: 0.5000 mg | ORAL_TABLET | Freq: Two times a day (BID) | ORAL | Status: DC

## 2015-09-14 MED ORDER — LEVOTHYROXINE SODIUM 50 MCG PO TABS: 50.0000 ug | ORAL_TABLET | Freq: Every day | ORAL | Status: DC

## 2015-09-14 NOTE — Discharge Summary (Signed)
Physician Discharge Summary  Sonya [name] [id number] DOB: 1950/06/28 DOA: 09/09/2015  PCP: Reginia Forts, MD  Admit date: 09/09/2015 Discharge date: 09/14/2015  Time spent: 35 minutes  Recommendations for Outpatient Follow-up:  1. She was admitted for narcotic overdose, on discharge MS Contin was discontinued with a decrease in her baclofen and Klonopin. I instructed her to follow-up at her pain management clinic as soon as possible. 2. She was diagnosed with atrial fibrillation during this hospitalization, discharged on Cardizem and Xarelto.    Discharge Diagnoses:  Active Problems:   Acute hypoxemic respiratory failure (HCC)   Pressure ulcer   Acute encephalopathy   Aspiration pneumonia of right middle lobe (HCC)   Atrial fibrillation (HCC)   Atrial fibrillation with RVR (HCC)   Aspiration pneumonia of right lower lobe due to vomit (HCC)   Chronic bronchitis with COPD (chronic obstructive pulmonary disease) ([place])   Other specified hypothyroidism   Hypokalemia   Discharge Condition: Stable  Diet recommendation: Heart healthy diet  Filed Weights   09/12/15 0446 09/13/15 0114 09/14/15 0454  Weight: 93.6 kg (206 lb 5.6 oz) 94.2 kg (207 lb 10.8 oz) 92.4 kg (203 lb 11.3 oz)    History of present illness:  65 yo CF with MMP including Hypothyroid, depression, 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 She has been on chronic narcotics for years but recently had her medications changed to long acting morphine. Since this medication change her family states she has been progressively altered, and confused. This morning she was found by her husband to be minimally arousible and he left her in bed to for to sleep. She was then unarousible and he called 911. EMS found her O2 sats in the 70s she was given narcan with partial response per EMS and ED.   Hospital Course:  Sonya [name] is a 65 year old female with a history of chronic pain  syndrome, narcotic dependent reporting following pain management specialist, admitted on 09/10/2015 presenting with narcotic overdose. On presentation she was found severely minimally arousable, hypoxemic with oxygen saturations in the 70s, for which she was given Narcan. She was also found to have right-sided infiltrate suspicious for aspiration given mental status changes. She was initially admitted to the intensive care unit under the care of pulmonary critical care medicine. Given improvements in the following 24 hours she was transferred to step down. Hospitalization complicated by atrial fibrillation with rapid ventricular response having ventricular rates in the 130s to 150s. Initially treated with amiodarone infusion after which converted to normal sinus rhythm. She went back into A. fib then treated with Cardizem. Patient's subsequently converting to normal sinus rhythm with transition to oral Cardizem therapy. Cardiology was consulted. Transthoracic echocardiogram showed EF of 50-55% with grade 1 diastolic dysfunction. She was transferred to the floor on 09/13/2015. By 09/14/2015 she remained stable in normal sinus rhythm. Cardiology feeling that she was stable from a cardiac standpoint did not recommend further workup. She was discharged on 09/14/2015 to her home. She was given instructions to follow-up at the pain management clinic at first available appointment.   Procedures:  Transthoracic echocardiogram performed on 09/11/2015 - Left ventricle: The cavity size was normal. Systolic function was  normal. The estimated ejection fraction was in the range of 50%  to 55%. Wall motion was normal; there were no regional wall  motion abnormalities. There was an increased relative  contribution of atrial contraction to ventricular filling.  Doppler parameters are consistent with abnormal left ventricular  relaxation (grade 1 diastolic dysfunction). - Mitral valve: There was mild  regurgitation. - Pulmonary arteries: PA peak pressure: 40 mm Hg (S).  Consultations:  Cardiology  Discharge Exam: Filed Vitals:   09/14/15 1022 09/14/15 1303  BP: 128/81 128/75  Pulse:  87  Temp:  98.2 F (36.8 C)  Resp:  18    General: She has awake and alert oriented, mentating well Cardiovascular: Regular rate and rhythm normal S1-S2 Respiratory: Normal respiratory effort lungs are clear Abdomen: Soft nontender nondistended Extremities: No edema   Discharge Instructions   Discharge Instructions    Call MD for:  difficulty breathing, headache or visual disturbances    Complete by:  As directed      Call MD for:  extreme fatigue    Complete by:  As directed      Call MD for:  hives    Complete by:  As directed      Call MD for:  persistant dizziness or light-headedness    Complete by:  As directed      Call MD for:  persistant nausea and vomiting    Complete by:  As directed      Call MD for:  redness, tenderness, or signs of infection (pain, swelling, redness, odor or green/yellow discharge around incision site)    Complete by:  As directed      Call MD for:  severe uncontrolled pain    Complete by:  As directed      Call MD for:  temperature >100.4    Complete by:  As directed      Call MD for:    Complete by:  As directed      Diet - low sodium heart healthy    Complete by:  As directed      Increase activity slowly    Complete by:  As directed           Current Discharge Medication List    START taking these medications   Details  amoxicillin-clavulanate (AUGMENTIN) 875-125 MG tablet Take 1 tablet by mouth 2 (two) times daily. Qty: 6 tablet, Refills: 0    diltiazem (CARDIZEM CD) 240 MG 24 hr capsule Take 1 capsule (240 mg total) by mouth daily. Qty: 30 capsule, Refills: 1    levalbuterol (XOPENEX HFA) 45 MCG/ACT inhaler Inhale 2 puffs into the lungs every 6 (six) hours as needed for wheezing or shortness of breath. Qty: 1 Inhaler, Refills: 12     pantoprazole (PROTONIX) 40 MG tablet Take 1 tablet (40 mg total) by mouth daily at 12 noon. Qty: 30 tablet, Refills: 1    rivaroxaban (XARELTO) 20 MG TABS tablet Take 1 tablet (20 mg total) by mouth daily with supper. Qty: 30 tablet, Refills: 3      CONTINUE these medications which have CHANGED   Details  baclofen (LIORESAL) 10 MG tablet Take 0.5 tablets (5 mg total) by mouth 2 (two) times daily. Qty: 60 tablet, Refills: 0    clonazePAM (KLONOPIN) 0.5 MG tablet Take 1 tablet (0.5 mg total) by mouth 2 (two) times daily. Qty: 15 tablet, Refills: 0    levothyroxine (SYNTHROID) 50 MCG tablet Take 1 tablet (50 mcg total) by mouth daily before breakfast. Qty: 30 tablet, Refills: 1      CONTINUE these medications which have NOT CHANGED   Details  AMITIZA 24 MCG capsule TAKE 1 CAPSULE BY MOUTH TWICE DAILY AS NEEDED Refills: 2    budesonide (PULMICORT FLEXHALER) 180 MCG/ACT inhaler Inhale  1 puff into the lungs 2 (two) times daily.    DULoxetine (CYMBALTA) 60 MG capsule Take 1 capsule (60 mg total) by mouth daily. Qty: 90 capsule, Refills: 1    fexofenadine (ALLEGRA) 180 MG tablet Take 1 tablet (180 mg total) by mouth daily. Qty: 90 tablet, Refills: 3    montelukast (SINGULAIR) 10 MG tablet Take 10 mg by mouth at bedtime. Refills: 11    nitroGLYCERIN (NITROSTAT) 0.4 MG SL tablet Place 1 tablet (0.4 mg total) under the tongue every 5 (five) minutes as needed. Qty: 25 tablet, Refills: 6    OXYGEN Inhale 2 L into the lungs at bedtime.    potassium chloride SA (K-DUR,KLOR-CON) 20 MEQ tablet Take 1 tablet (20 mEq total) by mouth daily. Qty: 90 tablet, Refills: 3    rosuvastatin (CRESTOR) 20 MG tablet Take 1 tablet (20 mg total) by mouth daily. Qty: 90 tablet, Refills: 3    tretinoin (RETIN-A) 0.1 % cream Apply a pea sized amount to the face nightly 20 min after washing, avoiding area around the eyes and creases around the nose.      STOP taking these medications     albuterol  (PROVENTIL HFA;VENTOLIN HFA) 108 (90 BASE) MCG/ACT inhaler      esomeprazole (NEXIUM) 40 MG capsule      furosemide (LASIX) 40 MG tablet      gabapentin (NEURONTIN) 300 MG capsule      morphine (MS CONTIN) 60 MG 12 hr tablet      NUCYNTA 100 MG TABS      ranitidine (ZANTAC) 150 MG tablet      zolpidem (AMBIEN) 10 MG tablet        Allergies  Allergen Reactions  . Latex Other (See Comments)    OPEN SORES  . Levofloxacin   . Sulfonamide Derivatives    Follow-up Information    Follow up with SMITH,KRISTI, MD In 1 week.   Specialty:  Family Medicine   Contact information:   [place] [id number] [phone]        The results of significant diagnostics from this hospitalization (including imaging, microbiology, ancillary and laboratory) are listed below for reference.    Significant Diagnostic Studies: Dg Chest 2 View  09/14/2015  CLINICAL DATA:  65 year old female with a history of shortness of breath EXAM: CHEST  2 VIEW COMPARISON:  09/11/2015, 09/10/2015 FINDINGS: Cardiomediastinal silhouette unchanged.  Calcifications of the arch. Surgical changes of prior thoracic fusion again evident with fracture of the right-sided rod. Unchanged position of electrode terminating over the cervical region. Low lung volumes persist. Improving aeration of the right lung. Blunting of the bilateral costophrenic angles and meniscus/opacification on the lateral view. Compared to the plain film of 09/10/2015, there is significant improvement in airspace opacity of the right lung. No pneumothorax. No confluent airspace disease. IMPRESSION: Improving airspace disease bilaterally, with persistent small bilateral pleural effusions. Surgical changes of thoracic fixation again noted with fracture of the right-sided rod. Aortic atherosclerosis. Signed, Dulcy Fanny. Earleen Newport, DO [facility] Radiology Electronically Signed   By: Corrie Mckusick D.O.    On: 09/14/2015 13:55   Ct Head Wo Contrast  09/09/2015  CLINICAL DATA:  65 year old female with altered mental status. New prescription for morphine. Initial encounter. EXAM: CT HEAD WITHOUT CONTRAST TECHNIQUE: Contiguous axial images were obtained from the base of the skull through the vertex without intravenous contrast. COMPARISON:  None. FINDINGS: No intracranial hemorrhage or CT evidence of large acute infarct.  No intracranial mass lesion noted on this unenhanced exam. No hydrocephalus. Partial opacification left mastoid air cells. No acute orbital abnormality. IMPRESSION: No intracranial hemorrhage or CT evidence of large acute infarct. Minimal partial opacification left mastoid air cells. Electronically Signed   By: Genia Del M.D.   On: 09/09/2015 23:12   US Abdomen Limited  09/10/2015  CLINICAL DATA:  Fever.  Right upper quadrant pain. EXAM: US ABDOMEN LIMITED - RIGHT UPPER QUADRANT COMPARISON:  None. FINDINGS: Gallbladder: No gallstones or wall thickening visualized. No sonographic Murphy sign noted by sonographer. Common bile duct: Diameter: 2 mm Liver: Slight increased echogenicity. IMPRESSION: 1. The gallbladder is unremarkable on this study. 2. Mild hepatic steatosis. Electronically Signed   By: Dorise Bullion III M.D   On: 09/10/2015 02:33   Dg Chest Port 1 View  09/11/2015  CLINICAL DATA:  Respiratory failure. EXAM: PORTABLE CHEST 1 VIEW COMPARISON:  09/10/2015. FINDINGS: 0531 hours. The cardio pericardial silhouette is enlarged. Airspace disease in the right lung has become slightly more diffuse in the interval. There is some atelectasis or pneumonia medial left base. Tiny bilateral pleural effusions again noted. Cervical spinal stimulator again noted. Bilateral thoracolumbar fusion rods are evident with fracture of the right rod again noted. Telemetry leads overlie the chest. IMPRESSION: Focal right mid lung airspace disease is become slightly more diffuse in the interval. Tiny  bilateral pleural effusions. Electronically Signed   By: Misty Stanley M.D.   On: 09/11/2015 07:15   Dg Chest Port 1 View  09/10/2015  CLINICAL DATA:  65 year old female with shortness of breath and wet sound Ing cough. Query aspiration. Initial encounter. EXAM: PORTABLE CHEST 1 VIEW COMPARISON:  09/09/2015, [facility] chest radiographs 06/21/2015 and earlier FINDINGS: Portable AP semi upright view at at 0908 hours. 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 Right abdomen generator device with cervical spinal stimulator re- demonstrated. Confluent subtotal right lung airspace opacity has not significantly changed from yesterday. Streaky and patchy opacity at the left lung base also is stable. No superimposed pneumothorax. Probable small pleural effusions. Stable cardiac size and mediastinal contours. Calcified aortic atherosclerosis. IMPRESSION: 1. Stable ventilation with subtotal right lung airspace opacity suspicious for aspiration and/or pneumonia in this setting. Lesser patchy opacity at the left lung base. 2. Probable small bilateral pleural effusions. 3. Stable visible posterior spine hardware. Cervical spinal stimulator device again noted. Electronically Signed   By: Genevie Ann M.D.   On: 09/10/2015 10:00   Dg Chest Portable 1 View  09/09/2015  CLINICAL DATA:  Altered mental status. EXAM: PORTABLE CHEST 1 VIEW COMPARISON:  None. FINDINGS: Mild cardiomegaly noted. Right lung airspace disease is identified. There is no evidence of pneumothorax. Left basilar first atelectasis versus scarring noted. Thoracic surgical hardware (right rod fracture at the T11 level) and probable cervical neurostimulator noted. IMPRESSION: Diffuse right lung airspace disease -question pneumonia/aspiration. Electronically Signed   By: Margarette Canada M.D.   On: 09/09/2015 21:47    Microbiology: Recent Results (from the past 240 hour(s))  Blood culture (routine x 2)      Status: None   Collection Time: 09/09/15  8:40 PM  Result Value Ref Range Status   Specimen Description BLOOD RIGHT ANTECUBITAL  Final   Special Requests IN PEDIATRIC BOTTLE 2.5CC  Final   Culture NO GROWTH 5 DAYS  Final   Report Status 09/14/2015 FINAL  Final  Blood culture (routine x 2)     Status: None  Collection Time: 09/09/15  8:45 PM  Result Value Ref Range Status   Specimen Description BLOOD RIGHT ANTECUBITAL  Final   Special Requests BOTTLES DRAWN AEROBIC AND ANAEROBIC 5CC   Final   Culture NO GROWTH 5 DAYS  Final   Report Status 09/14/2015 FINAL  Final  MRSA PCR Screening     Status: None   Collection Time: 09/10/15  4:59 AM  Result Value Ref Range Status   MRSA by PCR NEGATIVE NEGATIVE Final    Comment:        The GeneXpert MRSA Assay (FDA approved for NASAL specimens only), is one component of a comprehensive MRSA colonization surveillance program. It is not intended to diagnose MRSA infection nor to guide or monitor treatment for MRSA infections.      Labs: Basic Metabolic Panel:  Recent Labs Lab 09/10/15 0132 09/10/15 [id number] 09/10/15 1812 09/11/15 0250 09/12/15 0237 09/13/15 0218 09/13/15 1501  NA  --  141 142 141 144 141  --   K  --  3.4* 3.5 3.3* 4.5 2.8* 3.1*  CL  --  108 110 110 113* 110  --   CO2  --  24 25 23  21* 25  --   GLUCOSE  --  129* 100* 116* 68 89  --   BUN  --  19 15 12 7 8   --   CREATININE  --  1.04* 0.74 0.57 0.52 0.61  --   CALCIUM  --  7.4* 8.1* 7.7* 8.2* 7.9*  --   MG 1.9 2.1 2.1  --   --   --  1.9  PHOS 2.5 2.7  --   --   --   --   --    Liver Function Tests:  Recent Labs Lab 09/09/15 2040 09/10/15 0614 09/11/15 0250 09/13/15 0218  AST 1558* 1009* 486* 122*  ALT 776* 683* 610* 351*  ALKPHOS 69 67 61 66  BILITOT 0.9 0.8 0.6 1.0  PROT 6.8 5.4* 5.0* 4.9*  ALBUMIN 3.1* 2.8* 2.4* 2.4*    Recent Labs Lab 09/09/15 2040  LIPASE 33    Recent Labs Lab 09/09/15 2102  AMMONIA 18   CBC:  Recent Labs Lab  09/10/15 0614 09/11/15 0610 09/11/15 1929 09/12/15 0237 09/13/15 0218  WBC 11.1* 8.6 10.7* 9.6 7.3  HGB 11.4* 7.8* 10.9* 11.1* 10.7*  HCT 39.9 25.9* 34.3* 34.3* 35.3*  MCV 91.5 91.5 88.6 87.7 90.5  PLT PLATELET CLUMPS NOTED ON SMEAR, COUNT APPEARS ADEQUATE 163 181 180 177   Cardiac Enzymes:  Recent Labs Lab 09/10/15 0132 09/10/15 0614 09/10/15 1316  CKTOTAL 350*  --   --   TROPONINI 0.38* 0.31* 0.20*   BNP: BNP (last 3 results)  Recent Labs  09/09/15 2102  BNP 462.1*    ProBNP (last 3 results) No results for input(s): PROBNP in the last 8760 hours.  CBG:  Recent Labs Lab 09/09/15 2054 09/10/15 0452  GLUCAP 104* 136*       Signed:  Kelvin Cellar MD.  [facility] 09/14/2015, 2:30 PM

## 2015-09-14 NOTE — Discharge Instructions (Signed)
Information on my medicine - XARELTO (Rivaroxaban)  This medication education was reviewed with me or my healthcare representative as part of my discharge preparation.  The pharmacist that spoke with me during my hospital stay was:  Shandi Godfrey, [place], [facility]  Why was Xarelto prescribed for you? Xarelto was prescribed for you to reduce the risk of a blood clot forming that can cause a stroke if you have a medical condition called atrial fibrillation (a type of irregular heartbeat).  What do you need to know about xarelto ? Take your Xarelto ONCE DAILY at the same time every day with your evening meal. If you have difficulty swallowing the tablet whole, you may crush it and mix in applesauce just prior to taking your dose.  Take Xarelto exactly as prescribed by your doctor and DO NOT stop taking Xarelto without talking to the doctor who prescribed the medication.  Stopping without other stroke prevention medication to take the place of Xarelto may increase your risk of developing a clot that causes a stroke.  Refill your prescription before you run out.  After discharge, you should have regular check-up appointments with your healthcare provider that is prescribing your Xarelto.  In the future your dose may need to be changed if your kidney function or weight changes by a significant amount.  What do you do if you miss a dose? If you are taking Xarelto ONCE DAILY and you miss a dose, take it as soon as you remember on the same day then continue your regularly scheduled once daily regimen the next day. Do not take two doses of Xarelto at the same time or on the same day.   Important Safety Information A possible side effect of Xarelto is bleeding. You should call your healthcare provider right away if you experience any of the following: ? Bleeding from an injury or your nose that does not stop. ? Unusual colored urine (red or dark brown) or unusual colored stools (red or black). ? Unusual  bruising for unknown reasons. ? A serious fall or if you hit your head (even if there is no bleeding).  Some medicines may interact with Xarelto and might increase your risk of bleeding while on Xarelto. To help avoid this, consult your healthcare provider or pharmacist prior to using any new prescription or non-prescription medications, including herbals, vitamins, non-steroidal anti-inflammatory drugs (NSAIDs) and supplements.  This website has more information on Xarelto: [email or web address]

## 2015-09-14 NOTE — Progress Notes (Signed)
Per Clinical biochemist- for Xarelto S/W MARIE @ [facility] # [phone]   XARELTO 20 MG DAILY ( 30 )  COVER-YES  CO-PAY- $ 24.00  PRIOR APPROVAL- NO  PHARMACY: [facility], [facility] AND [facility]

## 2015-09-14 NOTE — Progress Notes (Signed)
Received cal from [place] w/[employer] who says she would like a nurse to visit pt. At home [phone]

## 2015-09-14 NOTE — Progress Notes (Signed)
TELEMETRY: Reviewed telemetry pt in NSR. No further Afib  Filed Vitals:   09/14/15 0809 09/14/15 1022 09/14/15 1252 09/14/15 1303  BP:  128/81  128/75  Pulse:    87  Temp:    98.2 F (36.8 C)  TempSrc:    Oral  Resp:    18  Height:      Weight:      SpO2: 94%  93% 95%    Intake/Output Summary (Last 24 hours) at 09/14/15 1345 Last data filed at 09/14/15 1245  Gross per 24 hour  Intake    360 ml  Output      0 ml  Net    360 ml   Filed Weights   09/12/15 0446 09/13/15 0114 09/14/15 0454  Weight: 206 lb 5.6 oz (93.6 kg) 207 lb 10.8 oz (94.2 kg) 203 lb 11.3 oz (92.4 kg)    Subjective No chest pain. Breathing is better.  Marland Kitchen ampicillin-sulbactam (UNASYN) IV  1.5 g Intravenous Q8H  . antiseptic oral rinse  7 mL Mouth Rinse q12n4p  . budesonide  0.25 mg Inhalation BID  . chlorhexidine  15 mL Mouth Rinse BID  . clonazePAM  0.5 mg Oral BID  . diltiazem  240 mg Oral Daily  . DULoxetine  60 mg Oral Daily  . levalbuterol  1.25 mg Nebulization TID  . levothyroxine  25 mcg Oral QAC breakfast  . pantoprazole  40 mg Oral Q1200  . rivaroxaban  20 mg Oral Q supper  . rosuvastatin  20 mg Oral q1800   . sodium chloride 0.9 % 1,000 mL infusion 50 mL/hr at 09/12/15 0009    LABS: Basic Metabolic Panel:  Recent Labs  09/12/15 0237 09/13/15 0218 09/13/15 1501  NA 144 141  --   K 4.5 2.8* 3.1*  CL 113* 110  --   CO2 21* 25  --   GLUCOSE 68 89  --   BUN 7 8  --   CREATININE 0.52 0.61  --   CALCIUM 8.2* 7.9*  --   MG  --   --  1.9   Liver Function Tests:  Recent Labs  09/13/15 0218  AST 122*  ALT 351*  ALKPHOS 66  BILITOT 1.0  PROT 4.9*  ALBUMIN 2.4*   No results for input(s): LIPASE, AMYLASE in the last 72 hours. CBC:  Recent Labs  09/12/15 0237 09/13/15 0218  WBC 9.6 7.3  HGB 11.1* 10.7*  HCT 34.3* 35.3*  MCV 87.7 90.5  PLT 180 177   Cardiac Enzymes: No results for input(s): CKTOTAL, CKMB, CKMBINDEX, TROPONINI in the last 72 hours. BNP: No results  for input(s): PROBNP in the last 72 hours. D-Dimer: No results for input(s): DDIMER in the last 72 hours. Hemoglobin A1C: No results for input(s): HGBA1C in the last 72 hours. Fasting Lipid Panel: No results for input(s): CHOL, HDL, LDLCALC, TRIG, CHOLHDL, LDLDIRECT in the last 72 hours. Thyroid Function Tests: No results for input(s): TSH, T4TOTAL, T3FREE, THYROIDAB in the last 72 hours.  Invalid input(s): FREET3   Radiology/Studies:  No results found.  PHYSICAL EXAM General: Pleasant, NAD Psych: Normal affect. Neuro: Alert and oriented X 3. Moves all extremities spontaneously. HEENT: Normal Neck: Supple without bruits or JVD. Lungs: Resp regular and unlabored, CTA. Heart: RRR no s3, s4, or murmurs. Abdomen: Soft, non-tender, non-distended, BS + x 4.  Extremities: No clubbing, cyanosis or edema. DP/PT/Radials 2+ and equal bilaterally.  ASSESSMENT AND PLAN: 1. Paroxysmal Afib. . Afib most likely triggered  by respiratory failure, beta agonists,  and PNA. As respiratory status improves Afib has improved. If it becomes more persistent may need to consider antiarrhythmic drug therapy. Now fully anticoagulated with Xarelto. On Cardizem for rate control. Stable from cardiac standpoint.   2. HTN controlled.  3. PNA  4. HLD on crestor.   5. Acute respiratory failure/PNA- per primary team. On nebs, steroids and antibiotics. Hopefully can reduce beta agonist use with AFib.   6. Hypokalemia- need to replete  Present on Admission:  . Acute hypoxemic respiratory failure ([place]) . Pressure ulcer . Acute encephalopathy . Aspiration pneumonia of right middle lobe ([place]) . Atrial fibrillation ([place]) . Atrial fibrillation with RVR ([place]) . Aspiration pneumonia of right lower lobe due to vomit ([place]) . 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 Other specified hypothyroidism . Hypokalemia  Signed, Ilaria [place], [place] 09/14/2015 1:45  PM

## 2015-10-02 ENCOUNTER — Ambulatory Visit (INDEPENDENT_AMBULATORY_CARE_PROVIDER_SITE_OTHER): Payer: BLUE CROSS/BLUE SHIELD | Admitting: Family Medicine

## 2015-10-02 ENCOUNTER — Ambulatory Visit (INDEPENDENT_AMBULATORY_CARE_PROVIDER_SITE_OTHER): Payer: BLUE CROSS/BLUE SHIELD

## 2015-10-02 ENCOUNTER — Encounter: Payer: Self-pay | Admitting: Family Medicine

## 2015-10-02 VITALS — BP 102/72 | HR 93 | Temp 98.0°F | Resp 16 | Ht 62.0 in | Wt 187.4 lb

## 2015-10-02 DIAGNOSIS — H6122 Impacted cerumen, left ear: Secondary | ICD-10-CM | POA: Diagnosis not present

## 2015-10-02 DIAGNOSIS — I4891 Unspecified atrial fibrillation: Secondary | ICD-10-CM | POA: Diagnosis not present

## 2015-10-02 DIAGNOSIS — E034 Atrophy of thyroid (acquired): Secondary | ICD-10-CM

## 2015-10-02 DIAGNOSIS — E876 Hypokalemia: Secondary | ICD-10-CM | POA: Diagnosis not present

## 2015-10-02 DIAGNOSIS — M47814 Spondylosis without myelopathy or radiculopathy, thoracic region: Secondary | ICD-10-CM

## 2015-10-02 DIAGNOSIS — R7302 Impaired glucose tolerance (oral): Secondary | ICD-10-CM

## 2015-10-02 DIAGNOSIS — E7800 Pure hypercholesterolemia, unspecified: Secondary | ICD-10-CM | POA: Diagnosis not present

## 2015-10-02 DIAGNOSIS — J9601 Acute respiratory failure with hypoxia: Secondary | ICD-10-CM | POA: Diagnosis not present

## 2015-10-02 DIAGNOSIS — J690 Pneumonitis due to inhalation of food and vomit: Secondary | ICD-10-CM | POA: Diagnosis not present

## 2015-10-02 DIAGNOSIS — J449 Chronic obstructive pulmonary disease, unspecified: Secondary | ICD-10-CM

## 2015-10-02 DIAGNOSIS — M47816 Spondylosis without myelopathy or radiculopathy, lumbar region: Secondary | ICD-10-CM

## 2015-10-02 DIAGNOSIS — E038 Other specified hypothyroidism: Secondary | ICD-10-CM

## 2015-10-02 DIAGNOSIS — G9340 Encephalopathy, unspecified: Secondary | ICD-10-CM | POA: Diagnosis not present

## 2015-10-02 LAB — POCT CBC
Granulocyte percent: 72.2 %G (ref 37–80)
HEMATOCRIT: 41 % (ref 37.7–47.9)
HEMOGLOBIN: 14.2 g/dL (ref 12.2–16.2)
LYMPH, POC: 1.8 (ref 0.6–3.4)
MCH: 29.4 pg (ref 27–31.2)
MCHC: 34.5 g/dL (ref 31.8–35.4)
MCV: 85.1 fL (ref 80–97)
MID (cbc): 0.5 (ref 0–0.9)
MPV: 7.2 fL (ref 0–99.8)
POC GRANULOCYTE: 6.1 (ref 2–6.9)
POC LYMPH PERCENT: 21.5 %L (ref 10–50)
POC MID %: 6.3 %M (ref 0–12)
Platelet Count, POC: 263 10*3/uL (ref 142–424)
RBC: 4.82 M/uL (ref 4.04–5.48)
RDW, POC: 16.1 %
WBC: 8.5 10*3/uL (ref 4.6–10.2)

## 2015-10-02 LAB — COMPREHENSIVE METABOLIC PANEL
ALBUMIN: 4 g/dL (ref 3.6–5.1)
ALT: 19 U/L (ref 6–29)
AST: 21 U/L (ref 10–35)
Alkaline Phosphatase: 85 U/L (ref 33–130)
BILIRUBIN TOTAL: 0.7 mg/dL (ref 0.2–1.2)
BUN: 5 mg/dL — ABNORMAL LOW (ref 7–25)
CHLORIDE: 102 mmol/L (ref 98–110)
CO2: 24 mmol/L (ref 20–31)
CREATININE: 0.74 mg/dL (ref 0.50–0.99)
Calcium: 9.3 mg/dL (ref 8.6–10.4)
Glucose, Bld: 81 mg/dL (ref 65–99)
Potassium: 3.6 mmol/L (ref 3.5–5.3)
SODIUM: 136 mmol/L (ref 135–146)
TOTAL PROTEIN: 6.5 g/dL (ref 6.1–8.1)

## 2015-10-02 LAB — POCT URINALYSIS DIP (MANUAL ENTRY)
BILIRUBIN UA: NEGATIVE
GLUCOSE UA: NEGATIVE
Ketones, POC UA: NEGATIVE
Leukocytes, UA: NEGATIVE
NITRITE UA: NEGATIVE
Spec Grav, UA: 1.015
UROBILINOGEN UA: 0.2
pH, UA: 7

## 2015-10-02 LAB — LIPID PANEL
CHOLESTEROL: 166 mg/dL (ref 125–200)
HDL: 56 mg/dL (ref >=46)
LDL Cholesterol: 83 mg/dL (ref <130)
TRIGLYCERIDES: 135 mg/dL (ref <150)
Total CHOL/HDL Ratio: 3 Ratio (ref <=5.0)
VLDL: 27 mg/dL (ref <30)

## 2015-10-02 LAB — POC MICROSCOPIC URINALYSIS (UMFC): MUCUS RE: ABSENT

## 2015-10-02 MED ORDER — PANTOPRAZOLE SODIUM 40 MG PO TBEC: 40.0000 mg | DELAYED_RELEASE_TABLET | Freq: Every day | ORAL | 3 refills | Status: DC

## 2015-10-02 MED ORDER — ROSUVASTATIN CALCIUM 20 MG PO TABS: 20.0000 mg | ORAL_TABLET | Freq: Every day | ORAL | 3 refills | Status: DC

## 2015-10-02 MED ORDER — FEXOFENADINE HCL 180 MG PO TABS: 180.0000 mg | ORAL_TABLET | Freq: Every day | ORAL | 3 refills | Status: DC

## 2015-10-02 NOTE — Patient Instructions (Signed)
     IF you received an x-ray today, you will receive an invoice from [facility]. Please contact  Radiology at [phone] with questions or concerns regarding your invoice.   IF you received labwork today, you will receive an invoice from [facility]. Please contact [facility] at [phone] with questions or concerns regarding your invoice.   Our billing staff will not be able to assist you with questions regarding bills from these companies.  You will be contacted with the lab results as soon as they are available. The fastest way to get your results is to activate your My Chart account. Instructions are located on the last page of this paperwork. If you have not heard from us regarding the results in 2 weeks, please contact this office.      

## 2015-10-02 NOTE — Progress Notes (Signed)
Patient ID: Sonya [name], female   DOB: 09-27-1950, 65 y.o.   MRN: [id number]   By signing my name below I, Sonya [name], attest that this documentation has been prepared under the direction and in the presence of Wardell Honour MD. Electonically Signed. Sonya [name], Scribe 10/02/2015 at 4:26 PM   Subjective:    Patient ID: Sonya [name], female    DOB: [birth year], 65 y.o.   MRN: [id number]  10/02/2015  Annual Exam (No pap)   HPI Sonya [name] is a 65 y.o. female who presents to the [facility] for hospital admission follow up.  Pt was recently admitted to ICU 2 days after she was changed to long acting morphine and had an accidental opioid overdose and developed aspiration pneumonia and a-fib. Cardiology consulted during admission and pt underwent echo which revealed normal EF and Grade 1 diastolic dysfunction, other wise normal. She was recommended to follow up with PCP in 1 week. Pt now taking a blood thinner and Cardizem due to the a-fib. Pt had her medications changed around at her hospital stay. Pt's potassium dropped to 2.1 during her stay and was placed on potassium pills. Pt is no longer taking lasix. Pt cut her Klonopin dose in half. Pt is now using O2 at night. Pt has been ambulating around her house without any difficulty. Pt reports feeling weak compared to how she felt prior to overdose. Pt states she is doing much better now than she was.  Pt reports occasional episodes of heart palpitations. Pt also reports having some subsequent SOB since her pneumonia.   Pt is scheduled to see her eye doctor and her skin doctor this week. Pt has a follow up with her cardiologist next week.   Pt was seen and evaluated by her pain doctor on 09/19/15 and was changed back to her original medications. Pt reports that she has a broken rod and fractured bone in her back.  She suffers with chronic back pain for years.  Pt has an appointment to see a Pulmonologist on  10/16/15 to further evaluate her pneumonia.   Pt reports mild congestion and wax in left ear. Pt also reports left ear hearing loss.   Pt denies any CP, fever, pressure ulcers.   Pt has been having regular BMs since she has been taking amitiza that her pain doctor put her on.   Discharge summary details:  Admit date: 09/09/2015 Discharge date: 09/14/2015  Time spent: 35 minutes  Recommendations for Outpatient Follow-up:  1. She was admitted for narcotic overdose, on discharge MS Contin was discontinued with a decrease in her baclofen and Klonopin. I instructed her to follow-up at her pain management clinic as soon as possible. 2. She was diagnosed with atrial fibrillation during this hospitalization, discharged on Cardizem and Xarelto.    Discharge Diagnoses:  Active Problems:   Acute hypoxemic respiratory failure (HCC)   Pressure ulcer   Acute encephalopathy   Aspiration pneumonia of right middle lobe (HCC)   Atrial fibrillation (HCC)   Atrial fibrillation with RVR (HCC)   Aspiration pneumonia of right lower lobe due to vomit (HCC)   Chronic bronchitis with COPD (chronic obstructive pulmonary disease) ([place])   Other specified hypothyroidism   Hypokalemia   Discharge Condition: Stable  Diet recommendation: Heart healthy diet       Filed Weights   09/12/15 0446 09/13/15 0114 09/14/15 0454  Weight: 93.6 kg (206 lb 5.6 oz) 94.2 kg (207 lb 10.8 oz) 92.4 kg (203  lb 11.3 oz)    History of present illness:  65 yo CF with MMP including Hypothyroid, depression, 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 She has been on chronic narcotics for years but recently had her medications changed to long acting morphine. Since this medication change her family states she has been progressively altered, and confused. This morning she was found by her husband to be minimally arousible and he left her in bed to for to sleep. She was then  unarousible and he called 911. EMS found her O2 sats in the 70s she was given narcan with partial response per EMS and ED.   Hospital Course:  [name] is a 65 year old female with a history of chronic pain syndrome, narcotic dependent reporting following pain management specialist, admitted on 09/10/2015 presenting with narcotic overdose. On presentation she was found severely minimally arousable, hypoxemic with oxygen saturations in the 70s, for which she was given Narcan. She was also found to have right-sided infiltrate suspicious for aspiration given mental status changes. She was initially admitted to the intensive care unit under the care of pulmonary critical care medicine. Given improvements in the following 24 hours she was transferred to step down. Hospitalization complicated by atrial fibrillation with rapid ventricular response having ventricular rates in the 130s to 150s. Initially treated with amiodarone infusion after which converted to normal sinus rhythm. She went back into A. fib then treated with Cardizem. Patient's subsequently converting to normal sinus rhythm with transition to oral Cardizem therapy. Cardiology was consulted. Transthoracic echocardiogram showed EF of 50-55% with grade 1 diastolic dysfunction. She was transferred to the floor on 09/13/2015. By 09/14/2015 she remained stable in normal sinus rhythm. Cardiology feeling that she was stable from a cardiac standpoint did not recommend further workup. She was discharged on 09/14/2015 to her home. She was given instructions to follow-up at the pain management clinic at first available appointment.   Procedures:  Transthoracic echocardiogram performed on 09/11/2015 - Left ventricle: The cavity size was normal. Systolic function was  normal. The estimated ejection fraction was in the range of 50%  to 55%. Wall motion was normal; there were no regional wall  motion abnormalities. There was an increased relative   contribution of atrial contraction to ventricular filling.  Doppler parameters are consistent with abnormal left ventricular  relaxation (grade 1 diastolic dysfunction). - Mitral valve: There was mild regurgitation. - Pulmonary arteries: PA peak pressure: 40 mm Hg (S).  Consultations:  Cardiology  START taking these medications   Details  amoxicillin-clavulanate (AUGMENTIN) 875-125 MG tablet Take 1 tablet by mouth 2 (two) times daily. Qty: 6 tablet, Refills: 0    diltiazem (CARDIZEM CD) 240 MG 24 hr capsule Take 1 capsule (240 mg total) by mouth daily. Qty: 30 capsule, Refills: 1    levalbuterol (XOPENEX HFA) 45 MCG/ACT inhaler Inhale 2 puffs into the lungs every 6 (six) hours as needed for wheezing or shortness of breath. Qty: 1 Inhaler, Refills: 12    pantoprazole (PROTONIX) 40 MG tablet Take 1 tablet (40 mg total) by mouth daily at 12 noon. Qty: 30 tablet, Refills: 1    rivaroxaban (XARELTO) 20 MG TABS tablet Take 1 tablet (20 mg total) by mouth daily with supper. Qty: 30 tablet, Refills: 3      CONTINUE these medications which have CHANGED   Details  baclofen (LIORESAL) 10 MG tablet Take 0.5 tablets (5 mg total) by mouth 2 (two) times daily. Qty: 60  tablet, Refills: 0    clonazePAM (KLONOPIN) 0.5 MG tablet Take 1 tablet (0.5 mg total) by mouth 2 (two) times daily. Qty: 15 tablet, Refills: 0    levothyroxine (SYNTHROID) 50 MCG tablet Take 1 tablet (50 mcg total) by mouth daily before breakfast. Qty: 30 tablet, Refills: 1      CONTINUE these medications which have NOT CHANGED   Details  AMITIZA 24 MCG capsule TAKE 1 CAPSULE BY MOUTH TWICE DAILY AS NEEDED Refills: 2    budesonide (PULMICORT FLEXHALER) 180 MCG/ACT inhaler Inhale 1 puff into the lungs 2 (two) times daily.    DULoxetine (CYMBALTA) 60 MG capsule Take 1 capsule (60 mg total) by mouth daily. Qty: 90 capsule, Refills: 1    fexofenadine (ALLEGRA) 180 MG tablet Take 1 tablet (180 mg total)  by mouth daily. Qty: 90 tablet, Refills: 3    montelukast (SINGULAIR) 10 MG tablet Take 10 mg by mouth at bedtime. Refills: 11    nitroGLYCERIN (NITROSTAT) 0.4 MG SL tablet Place 1 tablet (0.4 mg total) under the tongue every 5 (five) minutes as needed. Qty: 25 tablet, Refills: 6    OXYGEN Inhale 2 L into the lungs at bedtime.    potassium chloride SA (K-DUR,KLOR-CON) 20 MEQ tablet Take 1 tablet (20 mEq total) by mouth daily. Qty: 90 tablet, Refills: 3    rosuvastatin (CRESTOR) 20 MG tablet Take 1 tablet (20 mg total) by mouth daily. Qty: 90 tablet, Refills: 3    tretinoin (RETIN-A) 0.1 % cream Apply a pea sized amount to the face nightly 20 min after washing, avoiding area around the eyes and creases around the nose.      STOP taking these medications     albuterol (PROVENTIL HFA;VENTOLIN HFA) 108 (90 BASE) MCG/ACT inhaler      esomeprazole (NEXIUM) 40 MG capsule      furosemide (LASIX) 40 MG tablet      gabapentin (NEURONTIN) 300 MG capsule      morphine (MS CONTIN) 60 MG 12 hr tablet      NUCYNTA 100 MG TABS      ranitidine (ZANTAC) 150 MG tablet      zolpidem (AMBIEN) 10 MG tablet         Review of Systems  Constitutional: Negative for activity change, appetite change, chills, diaphoresis, fatigue, fever and unexpected weight change.  HENT: Positive for hearing loss (left ear). Negative for congestion, dental problem, drooling, ear discharge, ear pain, facial swelling, mouth sores, nosebleeds, postnasal drip, rhinorrhea, sinus pressure, sneezing, sore throat, tinnitus, trouble swallowing and voice change.   Eyes: Negative for photophobia, pain, discharge, redness, itching and visual disturbance.  Respiratory: Positive for shortness of breath. Negative for apnea, cough, choking, chest tightness, wheezing and stridor.   Cardiovascular: Positive for palpitations (occasional episodes, none now). Negative for chest pain and leg swelling.   Gastrointestinal: Negative for abdominal distention, abdominal pain, anal bleeding, blood in stool, constipation, diarrhea, nausea, rectal pain and vomiting.  Endocrine: Negative for cold intolerance, heat intolerance, polydipsia, polyphagia and polyuria.  Genitourinary: Negative for decreased urine volume, difficulty urinating, dyspareunia, dysuria, enuresis, flank pain, frequency, genital sores, hematuria, menstrual problem, pelvic pain, urgency, vaginal bleeding, vaginal discharge and vaginal pain.  Musculoskeletal: Negative for arthralgias, back pain, gait problem, joint swelling, myalgias, neck pain and neck stiffness.  Skin: Negative for color change, pallor, rash and wound.  Allergic/Immunologic: Negative for environmental allergies, food allergies and immunocompromised state.  Neurological: Positive for weakness. Negative for dizziness, tremors, seizures, syncope, facial  asymmetry, speech difficulty, light-headedness, numbness and headaches.  Hematological: Negative for adenopathy. Does not bruise/bleed easily.  Psychiatric/Behavioral: Negative for agitation, behavioral problems, confusion, decreased concentration, dysphoric mood, hallucinations, self-injury, sleep disturbance and suicidal ideas. The patient is not nervous/anxious and is not hyperactive.     Past Medical History:  Diagnosis Date  . Allergic rhinitis, cause unspecified   . Anemia   . Anxiety   . Arthritis   . Benign neoplasm of colon   . Bruising   . Chest pain    a. 2015 Neg MV.  . Chronic pain syndrome   . Degeneration of lumbar or lumbosacral intervertebral disc   . Depressive disorder, not elsewhere classified   . Edema   . Emphysema   . GERD (gastroesophageal reflux disease)   . Hematuria, unspecified   . Hiatal hernia   . HLD (hyperlipidemia)   . Hyperglycemia   . Insomnia, unspecified   . Lumbosacral spondylosis without myelopathy   . Neurotic excoriations   . Osteoporosis, unspecified   . PAF  (paroxysmal atrial fibrillation) ([place])    a. 08/2015 in setting of asp pna-->converted on dilt. Xarelto started (CHA2DS2VASc= 1).  . Personal history of urinary calculi   . Pneumonia   . Prurigo nodularis   . Pulmonary hypertension ([place])    a. 08/2015 Echo: EF 50-55%, no rwma, gr1 DD, mild MR, PASP 55mmHg.   Marland Kitchen Pure hypercholesterolemia   . Scoliosis (and kyphoscoliosis), idiopathic   . Squamous cell carcinoma ([place])   . Tobacco use disorder   . Unspecified hypothyroidism   . Unspecified vitamin D deficiency   . UTI (urinary tract infection)    frequent   Past Surgical History:  Procedure Laterality Date  . 2 harrington rods in back  1980  . ABDOMINAL HYSTERECTOMY     endometriosis; ovaries resected; no cancer.  [place] Anes  2006   both feet  . [place]  . EYE SURGERY    . JOINT REPLACEMENT  03/16/12   L TKR; R TKR  . lumbar spine surgery  [place]  . squamous cell carcinoma resection  02/2010   [name]  . TONSILLECTOMY AND ADENOIDECTOMY  1964  . VAGINAL HYSTERECTOMY  1990   Allergies  Allergen Reactions  . Latex Other (See Comments)    OPEN SORES  . Levofloxacin   . Sulfonamide Derivatives    Current Outpatient Prescriptions  Medication Sig Dispense Refill  . AMITIZA 24 MCG capsule TAKE 1 CAPSULE BY MOUTH TWICE DAILY AS NEEDED  2  . baclofen (LIORESAL) 10 MG tablet Take 0.5 tablets (5 mg total) by mouth 2 (two) times daily. 60 tablet 0  . budesonide (PULMICORT FLEXHALER) 180 MCG/ACT inhaler Inhale 1 puff into the lungs 2 (two) times daily.    . clonazePAM (KLONOPIN) 0.5 MG tablet Take 1 tablet (0.5 mg total) by mouth 2 (two) times daily. 15 tablet 0  . DULoxetine (CYMBALTA) 60 MG capsule Take 1 capsule (60 mg total) by mouth daily. 90 capsule 1  . fexofenadine (ALLEGRA) 180 MG tablet Take 1 tablet (180 mg total) by mouth daily. 90 tablet 3  . levalbuterol (XOPENEX HFA) 45 MCG/ACT inhaler Inhale 2 puffs into the lungs every 6  (six) hours as needed for wheezing or shortness of breath. 1 Inhaler 12  . levothyroxine (SYNTHROID) 50 MCG tablet Take 1 tablet (50 mcg total) by mouth daily before breakfast. 30 tablet 1  . montelukast (SINGULAIR) 10 MG tablet  Take 10 mg by mouth at bedtime.  11  . nitroGLYCERIN (NITROSTAT) 0.4 MG SL tablet Place 1 tablet (0.4 mg total) under the tongue every 5 (five) minutes as needed. 25 tablet 6  . OXYGEN Inhale 2 L into the lungs at bedtime.    . pantoprazole (PROTONIX) 40 MG tablet Take 1 tablet (40 mg total) by mouth daily at 12 noon. 90 tablet 3  . potassium chloride SA (K-DUR,KLOR-CON) 20 MEQ tablet Take 1 tablet (20 mEq total) by mouth daily. 90 tablet 3  . rosuvastatin (CRESTOR) 20 MG tablet Take 1 tablet (20 mg total) by mouth daily. 90 tablet 3  . tretinoin (RETIN-A) 0.1 % cream Apply a pea sized amount to the face nightly 20 min after washing, avoiding area around the eyes and creases around the nose.    . diltiazem (CARDIZEM CD) 240 MG 24 hr capsule Take 1 capsule (240 mg total) by mouth daily. 90 capsule 3  . rivaroxaban (XARELTO) 20 MG TABS tablet Take 1 tablet (20 mg total) by mouth daily with supper. 90 tablet 3   No current facility-administered medications for this visit.    Social History   Social History  . Marital status: Married    Spouse name: Nyimah Towe  . Number of children: 1  . Years of education: 10   Occupational History  . disabled     3   DDD   Social History Main Topics  . Smoking status: Former Smoker    Packs/day: 1.00    Years: 25.00    Types: Cigarettes    Start date: 11/05/1984    Quit date: 11/06/2006  . Smokeless tobacco: Never Used     Comment: quit 11/07  . Alcohol use No  . Drug use: No  . Sexual activity: Yes    Partners: Male   Other Topics Concern  . Not on file   Social History Narrative   Marital status:  Married since 1992; second marriage; happily married; no abuse.      Children:  1 daughter; 3 stepchildren; 8  grandchildren; 2 gg.   Daughter in [place].  Husband's children in [place].      Employment:  Retired/disabled due to DDD lumbar.       Lives:   Lives with spouse;      Caffeine use cosumes a moderate amount 1 serving per day.      Always uses seat belts.       No guns in home.       Smoke alarm and carbon monoxide detectors in the home.       No Living Will.   Family History  Problem Relation Age of Onset  . Coronary artery disease Brother     stents    SIster stents also  . Heart disease Brother   . Heart failure Brother     CABG  AMI @ 66  . Heart disease Brother   . Heart failure Mother     pacemaker  . Heart disease Mother     pacemaker; CHF  . Hypertension Mother   . Heart failure Father     AMI  . Cancer Father 83    colon cancer  . Heart disease Father     mutliple AMIs  . Cancer Maternal Grandmother     ovarian  . Heart disease Sister 21    cardiac stenting  . Heart disease Brother   . Heart disease Brother   . GI problems  Family history malignancy, GI tract  . Breast cancer Cousin        Objective:    BP 102/72 (BP Location: Right Arm, Patient Position: Sitting, Cuff Size: Normal)   Pulse 93   Temp 98 F (36.7 C) (Oral)   Resp 16   Ht 5\' 2"  (1.575 m)   Wt 187 lb 6.4 oz (85 kg)   SpO2 97%   BMI 34.28 kg/m  Physical Exam  Constitutional: She is oriented to person, place, and time. She appears well-developed and well-nourished. No distress.  HENT:  Head: Normocephalic and atraumatic.  Right Ear: Hearing, tympanic membrane, external ear and ear canal normal.  Left Ear: External ear normal.  Nose: Nose normal.  Mouth/Throat: Oropharynx is clear and moist.  Pt has cerumen in left ear canal.   Eyes: Conjunctivae and EOM are normal. Pupils are equal, round, and reactive to light.  Neck: Normal range of motion and full passive range of motion without pain. Neck supple. No JVD present. Carotid bruit is not present. No thyromegaly present.    Cardiovascular: Normal rate, regular rhythm and normal heart sounds.  Exam reveals no gallop and no friction rub.   No murmur heard. Pulmonary/Chest: Effort normal and breath sounds normal. No respiratory distress. She has no decreased breath sounds. She has no wheezes. She has no rales.  Abdominal: Soft. Bowel sounds are normal. She exhibits no distension and no mass. There is no tenderness. There is no rebound and no guarding.  Musculoskeletal:       Right shoulder: Normal.       Left shoulder: Normal.       Cervical back: Normal.  Lymphadenopathy:    She has no cervical adenopathy.  Neurological: She is alert and oriented to person, place, and time. She has normal reflexes. No cranial nerve deficit. She exhibits normal muscle tone. Coordination normal.  Skin: Skin is warm and dry. No rash noted. She is not diaphoretic. No erythema. No pallor.  Diffuse excoriation with scaring on all 4 extremities.   Psychiatric: She has a normal mood and affect. Her behavior is normal. Judgment and thought content normal.  Nursing note and vitals reviewed.   EKG viewed and interpreted by Dr Tamala Julian. EKG shows normal sinus rhythm with diffuse ST wave changes, no acute changes.   Dg Chest 2 View  Result Date: 10/02/2015 CLINICAL DATA:  Aspiration pneumonia the right lower lobe follow-up EXAM: CHEST  2 VIEW COMPARISON:  09/14/2015 FINDINGS: Right suprahilar atelectasis that is stable from previous, new from 06/21/2015. No evidence of neighboring pneumonia or mass. Minimal atelectasis or scar at the left base. Resolved trace effusions. Normal heart size and aortic contours. Extensive spinal fixation with right-sided chronic rod fracture. Multilevel thoracic fusion/ankylosis with chronic compression deformities. IMPRESSION: 1. No airspace disease to suggest aspiration pneumonia. Resolved trace effusions. 2. Atelectasis that is stable from 09/14/2015. Electronically Signed   By: Monte Fantasia M.D.   On: 10/02/2015  15:33       Assessment & Plan:   1. Acute encephalopathy   2. Aspiration pneumonia of right lower lobe due to vomit ([place])   3. Acute hypoxemic respiratory failure (HCC)   4. Chronic obstructive pulmonary disease, unspecified COPD type ([place])   5. Atrial fibrillation with RVR ([place])   6. Glucose intolerance (impaired glucose tolerance)   7. Hypothyroidism due to acquired atrophy of thyroid   8. Hypokalemia   9. Spondylosis of lumbar region without myelopathy or radiculopathy   10. Degenerative  arthritis of thoracic spine   11. Pure hypercholesterolemia   12. Cerumen impaction, left     Orders Placed This Encounter  Procedures  . DG Chest 2 View    Standing Status:   Future    Number of Occurrences:   1    Standing Expiration Date:   10/01/2016    Order Specific Question:   Reason for Exam (SYMPTOM  OR DIAGNOSIS REQUIRED)    Answer:   aspiration pneumonia follow-up    Order Specific Question:   Preferred imaging location?    Answer:   External  . Comprehensive metabolic panel    Order Specific Question:   Has the patient fasted?    Answer:   Yes  . Lipid panel    Order Specific Question:   Has the patient fasted?    Answer:   Yes  . TSH  . T4, free  . Hemoglobin A1c  . Ear wax removal  . POCT CBC  . POCT Microscopic Urinalysis ([facility])  . POCT urinalysis dipstick  . EKG 12-Lead   Meds ordered this encounter  Medications  . rosuvastatin (CRESTOR) 20 MG tablet    Sig: Take 1 tablet (20 mg total) by mouth daily.    Dispense:  90 tablet    Refill:  3  . pantoprazole (PROTONIX) 40 MG tablet    Sig: Take 1 tablet (40 mg total) by mouth daily at 12 noon.    Dispense:  90 tablet    Refill:  3  . fexofenadine (ALLEGRA) 180 MG tablet    Sig: Take 1 tablet (180 mg total) by mouth daily.    Dispense:  90 tablet    Refill:  3   Recommended pt go all her scheduled follow up appointments.   Recommend weaning off of benzo since maintained on chronic narcotics; pt desires to wean  off of benzos as well; discussing with psychiatry. Admission records reviewed in detail during visit. Obtain labs and confirm potassium level is stable.  S/p repeat CXR negative. Repeat TSH/free T4 to confirm dose of Synthroid.    Return in about 6 months (around 04/03/2016) for complete physical examiniation.   I personally performed the services described in this documentation, which was scribed in my presence. The recorded information has been reviewed and considered.  Menachem Urbanek Elayne Guerin, M.D. [facility] [address] [place], [place]  [address] [phone] phone [phone] fax

## 2015-10-03 LAB — HEMOGLOBIN A1C
HEMOGLOBIN A1C: 5.8 % — AB (ref <5.7)
MEAN PLASMA GLUCOSE: 120 mg/dL

## 2015-10-03 LAB — T4, FREE: Free T4: 1.7 ng/dL (ref 0.8–1.8)

## 2015-10-03 LAB — TSH: TSH: 1.45 mIU/L

## 2015-10-12 ENCOUNTER — Ambulatory Visit (INDEPENDENT_AMBULATORY_CARE_PROVIDER_SITE_OTHER): Payer: BLUE CROSS/BLUE SHIELD | Admitting: Nurse Practitioner

## 2015-10-12 ENCOUNTER — Encounter: Payer: Self-pay | Admitting: Nurse Practitioner

## 2015-10-12 VITALS — BP 110/74 | HR 78 | Ht 62.0 in | Wt 188.5 lb

## 2015-10-12 DIAGNOSIS — I480 Paroxysmal atrial fibrillation: Secondary | ICD-10-CM | POA: Diagnosis not present

## 2015-10-12 DIAGNOSIS — I272 Other secondary pulmonary hypertension: Secondary | ICD-10-CM | POA: Diagnosis not present

## 2015-10-12 MED ORDER — RIVAROXABAN 20 MG PO TABS: 20.0000 mg | ORAL_TABLET | Freq: Every day | ORAL | 3 refills | Status: DC

## 2015-10-12 MED ORDER — DILTIAZEM HCL ER COATED BEADS 240 MG PO CP24: 240.0000 mg | ORAL_CAPSULE | Freq: Every day | ORAL | 3 refills | Status: DC

## 2015-10-12 NOTE — Progress Notes (Signed)
Office Visit    Patient Name: Sonya [name] Date of Encounter: 10/12/2015  Primary Care Provider:  Reginia Forts, MD Primary Cardiologist:  Johnny Bridge, MD   Chief Complaint    65 year old female with a history of chest pain and negative Myoview in 2015, hypothyroidism, depression, COPD, mild sleep apnea, chronic back pain on chronic narcotic therapy.   Past Medical History    Past Medical History:  Diagnosis Date  . Allergic rhinitis, cause unspecified   . Anemia   . Anxiety   . Arthritis   . Benign neoplasm of colon   . Bruising   . Chest pain    a. 2015 Neg MV.  . Chronic pain syndrome   . Degeneration of lumbar or lumbosacral intervertebral disc   . Depressive disorder, not elsewhere classified   . Edema   . Emphysema   . GERD (gastroesophageal reflux disease)   . Hematuria, unspecified   . Hiatal hernia   . HLD (hyperlipidemia)   . Hyperglycemia   . Insomnia, unspecified   . Lumbosacral spondylosis without myelopathy   . Neurotic excoriations   . Osteoporosis, unspecified   . PAF (paroxysmal atrial fibrillation) ([place])    a. 08/2015 in setting of asp pna-->converted on dilt. Xarelto started (CHA2DS2VASc= 1).  . Personal history of urinary calculi   . Pneumonia   . Prurigo nodularis   . Pulmonary hypertension ([place])    a. 08/2015 Echo: EF 50-55%, no rwma, gr1 DD, mild MR, PASP 62mmHg.   Marland Kitchen Pure hypercholesterolemia   . Scoliosis (and kyphoscoliosis), idiopathic   . Squamous cell carcinoma ([place])   . Tobacco use disorder   . Unspecified hypothyroidism   . Unspecified vitamin D deficiency   . UTI (urinary tract infection)    frequent   Past Surgical History:  Procedure Laterality Date  . 2 harrington rods in back  1980  . ABDOMINAL HYSTERECTOMY     endometriosis; ovaries resected; no cancer.  [place] Anes  2006   both feet  . [place]  . EYE SURGERY    . JOINT REPLACEMENT  03/16/12   L TKR; R TKR  . lumbar spine  surgery  [place]  . squamous cell carcinoma resection  02/2010   [name]  . TONSILLECTOMY AND ADENOIDECTOMY  1964  . VAGINAL HYSTERECTOMY  1990   Allergies  Allergies  Allergen Reactions  . Latex Other (See Comments)    OPEN SORES  . Levofloxacin   . Sulfonamide Derivatives     History of Present Illness    65 year old female with a history of chest pain and negative Myoview in 2015, hypothyroidism, depression, COPD, mild sleep apnea, chronic back pain on chronic narcotic therapy. She was recently admitted to [facility] in the setting of altered mental status, accidental narcotic overdose, and aspiration pneumonia. In that setting, she was hypoxic and developed A. fib with RVR. She was placed on IV diltiazem and did initially break, had a recurrence, and subsequent broken again. She was later transitioned to by mouth diltiazem. She was also placed on Xarelto. Following clinical recovery, she was discharged. Since discharge, she says that she did experience palpitations one day, lasting about an hour, and resolving spontaneously. She was relatively asymptomatic with the exception of feeling fluttering in her chest. She has been compliant with her medications. She denies chest pain, PND, orthopnea, dizziness or syncope, edema, or early satiety. She does have chronic dyspnea on exertion in  the setting of COPD. She does wear oxygen at night.  Home Medications    Prior to Admission medications   Medication Sig Start Date End Date Taking? Authorizing Provider  AMITIZA 24 MCG capsule TAKE 1 CAPSULE BY MOUTH TWICE DAILY AS NEEDED 04/27/15  Yes Historical Provider, MD  baclofen (LIORESAL) 10 MG tablet Take 0.5 tablets (5 mg total) by mouth 2 (two) times daily. 09/14/15  Yes Kelvin Cellar, MD  budesonide (PULMICORT FLEXHALER) 180 MCG/ACT inhaler Inhale 1 puff into the lungs 2 (two) times daily. 07/12/15 07/11/16 Yes Historical Provider, MD  clonazePAM (KLONOPIN) 0.5 MG tablet Take 1 tablet  (0.5 mg total) by mouth 2 (two) times daily. 09/14/15  Yes Kelvin Cellar, MD  diltiazem (CARDIZEM CD) 240 MG 24 hr capsule Take 1 capsule (240 mg total) by mouth daily. 10/12/15  Yes Rogelia Mire, NP  DULoxetine (CYMBALTA) 60 MG capsule Take 1 capsule (60 mg total) by mouth daily. 05/03/15  Yes Gonzella Lex, MD  fexofenadine (ALLEGRA) 180 MG tablet Take 1 tablet (180 mg total) by mouth daily. 10/02/15  Yes Wardell Honour, MD  levalbuterol [facility] HFA) 45 MCG/ACT inhaler Inhale 2 puffs into the lungs every 6 (six) hours as needed for wheezing or shortness of breath. 09/14/15  Yes Kelvin Cellar, MD  levothyroxine (SYNTHROID) 50 MCG tablet Take 1 tablet (50 mcg total) by mouth daily before breakfast. 09/14/15  Yes Kelvin Cellar, MD  montelukast (SINGULAIR) 10 MG tablet Take 10 mg by mouth at bedtime. 09/04/15  Yes Historical Provider, MD  nitroGLYCERIN (NITROSTAT) 0.4 MG SL tablet Place 1 tablet (0.4 mg total) under the tongue every 5 (five) minutes as needed. 05/10/13  Yes Minna Merritts, MD  OXYGEN Inhale 2 L into the lungs at bedtime.   Yes Historical Provider, MD  pantoprazole (PROTONIX) 40 MG tablet Take 1 tablet (40 mg total) by mouth daily at 12 noon. 10/02/15  Yes Wardell Honour, MD  potassium chloride SA (K-DUR,KLOR-CON) 20 MEQ tablet Take 1 tablet (20 mEq total) by mouth daily. 05/24/14  Yes Wardell Honour, MD  rivaroxaban (XARELTO) 20 MG TABS tablet Take 1 tablet (20 mg total) by mouth daily with supper. 10/12/15  Yes Rogelia Mire, NP  rosuvastatin (CRESTOR) 20 MG tablet Take 1 tablet (20 mg total) by mouth daily. 10/02/15  Yes Wardell Honour, MD  tretinoin (RETIN-A) 0.1 % cream Apply a pea sized amount to the face nightly 20 min after washing, avoiding area around the eyes and creases around the nose. 01/25/14  Yes Historical Provider, MD    Review of Systems    She denies chest pain, dyspnea, pnd, orthopnea, n, v, dizziness, syncope, edema, weight gain, or early satiety.  All  other systems reviewed and are otherwise negative except as noted above.  Physical Exam    VS:  BP 110/74 (BP Location: Left Arm, Patient Position: Sitting, Cuff Size: Normal)   Pulse 78   Ht 5\' 2"  (1.575 m)   Wt 188 lb 8 oz (85.5 kg)   BMI 34.48 kg/m  , BMI Body mass index is 34.48 kg/m. GEN: Well nourished, well developed, in no acute distress.  HEENT: normal.  Neck: Supple, no JVD, carotid bruits, or masses. Cardiac: RRR, no murmurs, rubs, or gallops. No clubbing, cyanosis, edema.  Radials/DP/PT 2+ and equal bilaterally.  Respiratory:  Respirations regular and unlabored, clear to auscultation bilaterally. GI: Soft, nontender, nondistended, BS + x 4. MS: no deformity or atrophy. Skin: warm and  dry, no rash. Neuro:  Strength and sensation are intact. Psych: Normal affect.  Accessory Clinical Findings    ECG - RSR, 78, LAD, LAFB, poor r progression.  Assessment & Plan    1.  PAF:  Pt was recently admitted w/ accidental narcotic overdose, Aspiration pneumonia, and PAF. She converted on diltiazem. She remains on diltiazem and Xarelto. She did have some palpitations a week or so ago. She has otherwise been doing well. I will continue diltiazem at its current dose, as her blood pressure is relatively soft and there is no room for titration. I will continue Xarelto for the time being.  2. Pulmonary hypertension: PA pressure was 40 mmHg by echocardiogram and recent hospital position. She does have a history of COPD and wears O2 at night. She also reports a history of snoring and has been evaluated for sleep study in the past. She is follow-up with pulmonology next week and she will discuss this further with them. She may need repeat sleep testing.  3. Recent elevated troponin: In the setting of #1 along with aspiration pneumonia and accidental narcotic overdose. She was not having chest pain. Echo showed normal LV function. She had previously normal stress test in 2015. No ischemic workup  planned at this time.  4. COPD with recent aspiration pneumonia: She has been doing well. She has follow-up with pulmonology next week.   5. Disposition: Follow-up in clinic in 3 months.  Murray Hodgkins, NP 10/12/2015, 5:02 PM

## 2015-10-12 NOTE — Patient Instructions (Signed)
Medication Instructions:  Written prescriptions provided for Xarelto and Diltiazem.   Follow-Up: Your physician recommends that you schedule a follow-up appointment in: 3 months with Dr. Rockey Situ.  It was a pleasure seeing you today here in the office. Please do not hesitate to give [place] a call back if you have any further questions. [place], BSN

## 2015-10-15 ENCOUNTER — Encounter: Payer: Self-pay | Admitting: Family Medicine

## 2015-10-17 ENCOUNTER — Encounter: Payer: Self-pay | Admitting: Family Medicine

## 2015-11-01 ENCOUNTER — Ambulatory Visit: Payer: BLUE CROSS/BLUE SHIELD | Admitting: Psychiatry

## 2015-11-05 ENCOUNTER — Telehealth: Payer: Self-pay

## 2015-11-05 NOTE — Telephone Encounter (Signed)
Patient states that she needs her medication by Wednesday 8/13. She goes out of town on Thrusday

## 2015-11-05 NOTE — Telephone Encounter (Signed)
Patient sent a message to Dr. Tamala Julian on 10/17/15 through [facility] and no response.   Per patient request:  Dear Dr Tamala Julian,  Per our conversations when I was in for the after hospital visit on August 8, you had wanted to get blood work back before writing scripts for thyroid medicine. You had said you would mail to me the prescription for whatever strength I needed after reviewing the results of blood work. 50mg . Or 88mg . Could you please advise me of this and mail a hard copy of the prescription so I can get it filled at [facility]. [place]. Quantity of 90 day supply with refills??  Thank you for your attention to this message  Kariann Besecker.     Birthdate:1951-01-04  [phone]

## 2015-11-06 ENCOUNTER — Ambulatory Visit (INDEPENDENT_AMBULATORY_CARE_PROVIDER_SITE_OTHER): Payer: BLUE CROSS/BLUE SHIELD | Admitting: Psychiatry

## 2015-11-06 ENCOUNTER — Encounter: Payer: Self-pay | Admitting: Psychiatry

## 2015-11-06 VITALS — BP 130/82 | HR 85 | Temp 97.6°F | Ht 62.0 in | Wt 193.8 lb

## 2015-11-06 DIAGNOSIS — F411 Generalized anxiety disorder: Secondary | ICD-10-CM

## 2015-11-06 DIAGNOSIS — F331 Major depressive disorder, recurrent, moderate: Secondary | ICD-10-CM | POA: Diagnosis not present

## 2015-11-06 MED ORDER — DULOXETINE HCL 60 MG PO CPEP: 60.0000 mg | ORAL_CAPSULE | Freq: Every day | ORAL | 1 refills | Status: DC

## 2015-11-06 MED ORDER — CLONAZEPAM 0.5 MG PO TABS: 0.5000 mg | ORAL_TABLET | Freq: Two times a day (BID) | ORAL | 1 refills | Status: DC

## 2015-11-06 MED ORDER — CLONAZEPAM 0.5 MG PO TABS: 0.5000 mg | ORAL_TABLET | Freq: Two times a day (BID) | ORAL | 2 refills | Status: DC

## 2015-11-06 NOTE — Progress Notes (Signed)
Follow-up for this 65 year old woman with chronic anxiety and depression. Patient reports since our last visit she had an admission to the intensive care unit. This was for what appears to be oversedation related to combination of pain medicine and other drugs. Among other things they cut down her clonazepam to 0.5 mg twice a day and discontinued her Ambien. She is feeling fine. Mood is good. Sleeping well. Anxiety under good control. No new complaints. Still feels a little sluggish.  Good eye contact. Neatly groomed. Speech normal in rate tone and volume. Affect euthymic. Denies any suicidal or homicidal ideation. Judgment and insight appear to be adequate.  [facility] medications [facility]ep[facility] Invoice [id number] [id number] 7. Also [facility]. Follow-up in 3 months.

## 2015-11-07 ENCOUNTER — Telehealth: Payer: Self-pay

## 2015-11-07 ENCOUNTER — Other Ambulatory Visit: Payer: Self-pay | Admitting: Family Medicine

## 2015-11-07 MED ORDER — LEVOTHYROXINE SODIUM 50 MCG PO TABS: 50.0000 ug | ORAL_TABLET | Freq: Every day | ORAL | 3 refills | Status: DC

## 2015-11-07 NOTE — Telephone Encounter (Signed)
Levothyroxine rx provided to patient today; refill approved; no change in dose at this time; recommend repeating labs at follow-up visit in six months.

## 2015-11-07 NOTE — Telephone Encounter (Signed)
pt states that she needs a hardcopy rx of her klonopin and cymbalta for a 90 day supply for each medication.

## 2015-11-07 NOTE — Telephone Encounter (Signed)
I see an orders only, Dr Tamala Julian were you writing her medication?

## 2015-11-08 ENCOUNTER — Other Ambulatory Visit: Payer: Self-pay | Admitting: Psychiatry

## 2015-11-08 MED ORDER — DULOXETINE HCL 60 MG PO CPEP: 60.0000 mg | ORAL_CAPSULE | Freq: Every day | ORAL | 1 refills | Status: DC

## 2015-11-08 MED ORDER — CLONAZEPAM 0.5 MG PO TABS: 0.5000 mg | ORAL_TABLET | Freq: Two times a day (BID) | ORAL | 1 refills | Status: DC

## 2015-11-08 NOTE — Progress Notes (Signed)
I am printing duplicates at her request, but I have already sent these prescriptions to [facility]. The cymbalta was sent electronically and the klonioin I phoned in.

## 2015-11-08 NOTE — Telephone Encounter (Signed)
Okay. I will print them and give them to Le[place], BUT I did already put both of them [facility] so they should be on the way to her alreaady.

## 2016-01-02 ENCOUNTER — Ambulatory Visit (INDEPENDENT_AMBULATORY_CARE_PROVIDER_SITE_OTHER): Payer: BLUE CROSS/BLUE SHIELD | Admitting: Family Medicine

## 2016-01-02 ENCOUNTER — Encounter: Payer: Self-pay | Admitting: Family Medicine

## 2016-01-02 VITALS — BP 118/76 | HR 83 | Temp 97.4°F | Resp 18 | Ht 62.0 in | Wt 198.6 lb

## 2016-01-02 DIAGNOSIS — K219 Gastro-esophageal reflux disease without esophagitis: Secondary | ICD-10-CM

## 2016-01-02 DIAGNOSIS — J449 Chronic obstructive pulmonary disease, unspecified: Secondary | ICD-10-CM | POA: Diagnosis not present

## 2016-01-02 DIAGNOSIS — Z23 Encounter for immunization: Secondary | ICD-10-CM

## 2016-01-02 DIAGNOSIS — F418 Other specified anxiety disorders: Secondary | ICD-10-CM

## 2016-01-02 DIAGNOSIS — E034 Atrophy of thyroid (acquired): Secondary | ICD-10-CM

## 2016-01-02 DIAGNOSIS — I4891 Unspecified atrial fibrillation: Secondary | ICD-10-CM | POA: Diagnosis not present

## 2016-01-02 DIAGNOSIS — F32A Depression, unspecified: Secondary | ICD-10-CM

## 2016-01-02 DIAGNOSIS — M47814 Spondylosis without myelopathy or radiculopathy, thoracic region: Secondary | ICD-10-CM

## 2016-01-02 DIAGNOSIS — F419 Anxiety disorder, unspecified: Secondary | ICD-10-CM

## 2016-01-02 DIAGNOSIS — M47816 Spondylosis without myelopathy or radiculopathy, lumbar region: Secondary | ICD-10-CM

## 2016-01-02 DIAGNOSIS — L309 Dermatitis, unspecified: Secondary | ICD-10-CM

## 2016-01-02 DIAGNOSIS — J301 Allergic rhinitis due to pollen: Secondary | ICD-10-CM

## 2016-01-02 DIAGNOSIS — Z0000 Encounter for general adult medical examination without abnormal findings: Secondary | ICD-10-CM

## 2016-01-02 DIAGNOSIS — E559 Vitamin D deficiency, unspecified: Secondary | ICD-10-CM

## 2016-01-02 DIAGNOSIS — E7800 Pure hypercholesterolemia, unspecified: Secondary | ICD-10-CM

## 2016-01-02 DIAGNOSIS — Z6836 Body mass index (BMI) 36.0-36.9, adult: Secondary | ICD-10-CM

## 2016-01-02 DIAGNOSIS — IMO0001 Reserved for inherently not codable concepts without codable children: Secondary | ICD-10-CM

## 2016-01-02 DIAGNOSIS — G894 Chronic pain syndrome: Secondary | ICD-10-CM | POA: Diagnosis not present

## 2016-01-02 DIAGNOSIS — F329 Major depressive disorder, single episode, unspecified: Secondary | ICD-10-CM

## 2016-01-02 DIAGNOSIS — M8589 Other specified disorders of bone density and structure, multiple sites: Secondary | ICD-10-CM | POA: Diagnosis not present

## 2016-01-02 DIAGNOSIS — R7302 Impaired glucose tolerance (oral): Secondary | ICD-10-CM

## 2016-01-02 DIAGNOSIS — E6609 Other obesity due to excess calories: Secondary | ICD-10-CM

## 2016-01-02 LAB — T4, FREE: Free T4: 1.2 ng/dL (ref 0.8–1.8)

## 2016-01-02 LAB — LIPID PANEL
CHOL/HDL RATIO: 3.4 ratio (ref <5.0)
CHOLESTEROL: 155 mg/dL (ref <200)
HDL: 46 mg/dL — ABNORMAL LOW (ref >50)
LDL Cholesterol: 88 mg/dL
Triglycerides: 106 mg/dL (ref <150)
VLDL: 21 mg/dL (ref <30)

## 2016-01-02 LAB — CBC WITH DIFFERENTIAL/PLATELET
Basophils Absolute: 0 cells/uL (ref 0–200)
Basophils Relative: 0 %
EOS PCT: 0 %
Eosinophils Absolute: 0 cells/uL — ABNORMAL LOW (ref 15–500)
HCT: 36.9 % (ref 35.0–45.0)
Hemoglobin: 11.5 g/dL — ABNORMAL LOW (ref 11.7–15.5)
LYMPHS PCT: 23 %
Lymphs Abs: 1426 cells/uL (ref 850–3900)
MCH: 26.6 pg — ABNORMAL LOW (ref 27.0–33.0)
MCHC: 31.2 g/dL — AB (ref 32.0–36.0)
MCV: 85.4 fL (ref 80.0–100.0)
MONOS PCT: 8 %
MPV: 9 fL (ref 7.5–12.5)
Monocytes Absolute: 496 cells/uL (ref 200–950)
NEUTROS PCT: 69 %
Neutro Abs: 4278 cells/uL (ref 1500–7800)
PLATELETS: 280 10*3/uL (ref 140–400)
RBC: 4.32 MIL/uL (ref 3.80–5.10)
RDW: 14.5 % (ref 11.0–15.0)
WBC: 6.2 10*3/uL (ref 3.8–10.8)

## 2016-01-02 LAB — POCT URINALYSIS DIP (MANUAL ENTRY)
Bilirubin, UA: NEGATIVE
Glucose, UA: NEGATIVE
Ketones, POC UA: NEGATIVE
Leukocytes, UA: NEGATIVE
NITRITE UA: NEGATIVE
PROTEIN UA: NEGATIVE
RBC UA: NEGATIVE
SPEC GRAV UA: 1.015
UROBILINOGEN UA: 0.2
pH, UA: 7

## 2016-01-02 LAB — COMPREHENSIVE METABOLIC PANEL
ALT: 17 U/L (ref 6–29)
AST: 21 U/L (ref 10–35)
Albumin: 4 g/dL (ref 3.6–5.1)
Alkaline Phosphatase: 81 U/L (ref 33–130)
BILIRUBIN TOTAL: 0.5 mg/dL (ref 0.2–1.2)
BUN: 8 mg/dL (ref 7–25)
CALCIUM: 9.1 mg/dL (ref 8.6–10.4)
CO2: 31 mmol/L (ref 20–31)
Chloride: 104 mmol/L (ref 98–110)
Creat: 0.82 mg/dL (ref 0.50–0.99)
GLUCOSE: 104 mg/dL — AB (ref 65–99)
Potassium: 4.9 mmol/L (ref 3.5–5.3)
SODIUM: 141 mmol/L (ref 135–146)
Total Protein: 6.3 g/dL (ref 6.1–8.1)

## 2016-01-02 LAB — VITAMIN D 25 HYDROXY (VIT D DEFICIENCY, FRACTURES): VIT D 25 HYDROXY: 34 ng/mL (ref 30–100)

## 2016-01-02 LAB — TSH: TSH: 2.03 m[IU]/L

## 2016-01-02 LAB — VITAMIN B12: VITAMIN B 12: 302 pg/mL (ref 200–1100)

## 2016-01-02 MED ORDER — FEXOFENADINE HCL 180 MG PO TABS: 180.0000 mg | ORAL_TABLET | Freq: Every day | ORAL | 3 refills | Status: DC

## 2016-01-02 NOTE — Progress Notes (Signed)
Subjective:    Patient ID: Sonya [name], female    DOB: Oct 09, 1950, 65 y.o.   MRN: [id number]  01/02/2016  Annual Exam (CPE)   HPI This 65 y.o. female presents for Complete Physical Examination.  Last physical: 05-24-2014 Pap smear: hysterectomy for endometriosis; ovaries resected.  No cancer or cervical dysplasia.  Mammogram:  12-2014 WNL Colonoscopy:   05-2010 WNL. [facility].01-03-2015 Bone density:  12-2014 Eye exam: cataract surgery B in 08/2015. Dental exam:    Immunization History  Administered Date(s) Administered  . Influenza Split 11/10/2011  . Influenza,inj,Quad PF,36+ Mos 12/20/2012, 10/26/2013, 12/01/2014, 01/02/2016  . Influenza-Unspecified 11/14/2009, 12/25/2010  . Pneumococcal Polysaccharide-23 01/02/2016  . Pneumococcal-Unspecified 12/18/2009  . Tdap 12/25/2010  . Zoster 02/24/2014   Wt Readings from Last 3 Encounters:  01/11/16 201 lb (91.2 kg)  01/02/16 198 lb 9.6 oz (90.1 kg)  10/12/15 188 lb 8 oz (85.5 kg)   BP Readings from Last 3 Encounters:  01/11/16 126/86  01/02/16 118/76  10/12/15 110/74   Dizziness and nausea at night for one week.  Husband reports that vomited the other night but pt does not remember.  Memory has worsened since hospitalization.  Somewhat worried about dementia.  Still taking Clonazepam 0.16m bid; this was a decrease to 1/2 bid.  Psychiatry wants to keep at this dose.  Also no sleep aide as well.  Now only taking Nucynta 1017mtid. Not interested in neuropsychiatric testing.    [facility]; now established with new dermatology.  [place] [facility].  Recommended rheumatology consultation. Worried about rheumatoid arthritis.  [facility]   Review of Systems  Constitutional: Positive for fatigue. Negative for activity change, appetite change, chills, diaphoresis, fever and unexpected weight change.  HENT: Negative for congestion, dental problem, drooling, ear discharge, ear  pain, facial swelling, hearing loss, mouth sores, nosebleeds, postnasal drip, rhinorrhea, sinus pressure, sneezing, sore throat, tinnitus, trouble swallowing and voice change.   Eyes: Negative for photophobia, pain, discharge, redness, itching and visual disturbance.  Respiratory: Negative for apnea, cough, choking, chest tightness, shortness of breath, wheezing and stridor.   Cardiovascular: Negative for chest pain, palpitations and leg swelling.  Gastrointestinal: Positive for nausea and vomiting. Negative for abdominal distention, abdominal pain, anal bleeding, blood in stool, constipation, diarrhea and rectal pain.  Endocrine: Negative for cold intolerance, heat intolerance, polydipsia, polyphagia and polyuria.  Genitourinary: Negative for decreased urine volume, difficulty urinating, dyspareunia, dysuria, enuresis, flank pain, frequency, genital sores, hematuria, menstrual problem, pelvic pain, urgency, vaginal bleeding, vaginal discharge and vaginal pain.  Musculoskeletal: Negative for arthralgias, back pain, gait problem, joint swelling, myalgias, neck pain and neck stiffness.  Skin: Positive for color change, rash and wound. Negative for pallor.  Allergic/Immunologic: Negative for environmental allergies, food allergies and immunocompromised state.  Neurological: Positive for dizziness. Negative for tremors, seizures, syncope, facial asymmetry, speech difficulty, weakness, light-headedness, numbness and headaches.  Hematological: Negative for adenopathy. Does not bruise/bleed easily.  Psychiatric/Behavioral: Negative for agitation, behavioral problems, confusion, decreased concentration, dysphoric mood, hallucinations, self-injury, sleep disturbance and suicidal ideas. The patient is nervous/anxious. The patient is not hyperactive.     Past Medical History:  Diagnosis Date  . Allergic rhinitis, cause unspecified   . Anemia   . Anxiety   . Arthritis   . Benign neoplasm of colon   .  Bruising   . Chest pain    a. 2015 Neg MV.  . Chronic pain syndrome   . Degeneration of lumbar or lumbosacral intervertebral  disc   . Depressive disorder, not elsewhere classified   . Edema   . Emphysema   . GERD (gastroesophageal reflux disease)   . Hematuria, unspecified   . Hiatal hernia   . HLD (hyperlipidemia)   . Hyperglycemia   . Insomnia, unspecified   . Lumbosacral spondylosis without myelopathy   . Neurotic excoriations   . Osteoporosis, unspecified   . PAF (paroxysmal atrial fibrillation) ([place])    a. 08/2015 in setting of asp pna-->converted on dilt. Xarelto started (CHA2DS2VASc= 1).  . Personal history of urinary calculi   . Pneumonia   . Prurigo nodularis   . Pulmonary hypertension    a. 08/2015 Echo: EF 50-55%, no rwma, gr1 DD, mild MR, PASP 68mHg.   .Marland KitchenPure hypercholesterolemia   . Scoliosis (and kyphoscoliosis), idiopathic   . Squamous cell carcinoma   . Tobacco use disorder   . Unspecified hypothyroidism   . Unspecified vitamin D deficiency   . UTI (urinary tract infection)    frequent   Past Surgical History:  Procedure Laterality Date  . 2 harrington rods in back  1980  . ABDOMINAL HYSTERECTOMY     endometriosis; ovaries resected; no cancer.  .[place] Anes 2006   both feet  . CATARACT EXTRACTION, BILATERAL    . [place]    . JOINT REPLACEMENT  03/16/12   L TKR; R TKR  . lumbar spine surgery  [place] . squamous cell carcinoma resection  02/2010   [facility] . TONSILLECTOMY AND ADENOIDECTOMY  1964  . VAGINAL HYSTERECTOMY  1990   Allergies  Allergen Reactions  . Latex Other (See Comments)    OPEN SORES  . Levofloxacin   . Sulfonamide Derivatives     Social History   Social History  . Marital status: Married    Spouse name: ACeazia Harb . Number of children: 1  . Years of education: 174  Occupational History  . disabled     151  DDD   Social History Main Topics  .  Smoking status: Former Smoker    Packs/day: 1.00    Years: 25.00    Types: Cigarettes    Start date: 11/05/1984    Quit date: 11/06/2006  . Smokeless tobacco: Never Used     Comment: quit 11/07  . Alcohol use No  . Drug use: No  . Sexual activity: Yes    Partners: Male   Other Topics Concern  . Not on file   Social History Narrative   Marital status:  Married since 1992; second marriage; happily married; no abuse.      Children:  1 daughter; 3 stepchildren; 8 grandchildren; 2 gg.   [place] in [place]  Husband's children in [place].      Employment:  Retired/disabled due to DDD lumbar.       Lives:   Lives with spouse;      Caffeine use cosumes a moderate amount 1 serving per day.      Always uses seat belts.       No guns in home.       Smoke alarm and carbon monoxide detectors in the home.       No Living Will.   Family History  Problem Relation Age of Onset  . Coronary artery disease Brother     stents    SIster stents also  . Heart disease Brother   .  Heart failure Brother     CABG  AMI @ 45  . Heart disease Brother   . Heart failure Mother     pacemaker  . Hypertension Mother   . Heart disease Mother     CHF; pacemaker  . Heart failure Father     AMI  . Cancer Father 8    colon cancer  . Heart disease Father     mutliple AMIs  . Cancer Maternal Grandmother     ovarian  . Heart disease Sister 8    cardiac stenting  . Heart disease Brother   . Heart disease Brother   . GI problems      Family history malignancy, GI tract  . Breast cancer Cousin        Objective:    BP 118/76   Pulse 83   Temp 97.4 F (36.3 C) (Oral)   Resp 18   Ht _0  (1.575 m)   Wt 198 lb 9.6 oz (90.1 kg)   SpO2 94%   BMI 36.32 kg/m  Physical Exam  Constitutional: She is oriented to person, place, and time. She appears well-developed and well-nourished. No distress.  HENT:  Head: Normocephalic and atraumatic.  Right Ear: External ear normal.  Left Ear: External ear normal.   Nose: Nose normal.  Mouth/Throat: Oropharynx is clear and moist.  Eyes: Conjunctivae and EOM are normal. Pupils are equal, round, and reactive to light.  Neck: Normal range of motion and full passive range of motion without pain. Neck supple. No JVD present. Carotid bruit is not present. No thyromegaly present.  Cardiovascular: Normal rate, regular rhythm and normal heart sounds.  Exam reveals no gallop and no friction rub.   No murmur heard. Pulmonary/Chest: Effort normal and breath sounds normal. She has no wheezes. She has no rales. Right breast exhibits no inverted nipple, no mass, no nipple discharge, no skin change and no tenderness. Left breast exhibits no inverted nipple, no mass, no nipple discharge, no skin change and no tenderness. Breasts are symmetrical.  Abdominal: Soft. Bowel sounds are normal. She exhibits no distension and no mass. There is no tenderness. There is no rebound and no guarding.  Musculoskeletal:       Right shoulder: Normal.       Left shoulder: Normal.       Cervical back: Normal.  Lymphadenopathy:    She has no cervical adenopathy.  Neurological: She is alert and oriented to person, place, and time. She has normal reflexes. No cranial nerve deficit. She exhibits normal muscle tone. Coordination normal.  Skin: Skin is warm and dry. No rash noted. She is not diaphoretic. No erythema. No pallor.  Psychiatric: She has a normal mood and affect. Her behavior is normal. Judgment and thought content normal.  Nursing note and vitals reviewed.  Results for orders placed or performed in visit on 01/02/16  CBC with Differential/Platelet  Result Value Ref Range   WBC 6.2 3.8 - 10.8 K/uL   RBC 4.32 3.80 - 5.10 MIL/uL   Hemoglobin 11.5 (L) 11.7 - 15.5 g/dL   HCT 36.9 35.0 - 45.0 %   MCV 85.4 80.0 - 100.0 fL   MCH 26.6 (L) 27.0 - 33.0 pg   MCHC 31.2 (L) 32.0 - 36.0 g/dL   RDW 14.5 11.0 - 15.0 %   Platelets 280 140 - 400 K/uL   MPV 9.0 7.5 - 12.5 fL   Neutro Abs  4,278 1,500 - 7,800 cells/uL   Lymphs Abs  1,426 850 - 3,900 cells/uL   Monocytes Absolute 496 200 - 950 cells/uL   Eosinophils Absolute 0 (L) 15 - 500 cells/uL   Basophils Absolute 0 0 - 200 cells/uL   Neutrophils Relative % 69 %   Lymphocytes Relative 23 %   Monocytes Relative 8 %   Eosinophils Relative 0 %   Basophils Relative 0 %   Smear Review Criteria for review not met   Comprehensive metabolic panel  Result Value Ref Range   Sodium 141 135 - 146 mmol/L   Potassium 4.9 3.5 - 5.3 mmol/L   Chloride 104 98 - 110 mmol/L   CO2 31 20 - 31 mmol/L   Glucose, Bld 104 (H) 65 - 99 mg/dL   BUN 8 7 - 25 mg/dL   Creat 0.82 0.50 - 0.99 mg/dL   Total Bilirubin 0.5 0.2 - 1.2 mg/dL   Alkaline Phosphatase 81 33 - 130 U/L   AST 21 10 - 35 U/L   ALT 17 6 - 29 U/L   Total Protein 6.3 6.1 - 8.1 g/dL   Albumin 4.0 3.6 - 5.1 g/dL   Calcium 9.1 8.6 - 10.4 mg/dL  Hemoglobin A1c  Result Value Ref Range   Hgb A1c MFr Bld 5.5 <5.7 %   Mean Plasma Glucose 111 mg/dL  Lipid panel  Result Value Ref Range   Cholesterol 155 <200 mg/dL   Triglycerides 106 <150 mg/dL   HDL 46 (L) >50 mg/dL   Total CHOL/HDL Ratio 3.4 <5.0 Ratio   VLDL 21 <30 mg/dL   LDL Cholesterol 88 mg/dL  T4, free  Result Value Ref Range   Free T4 1.2 0.8 - 1.8 ng/dL  TSH  Result Value Ref Range   TSH 2.03 mIU/L  Vitamin B12  Result Value Ref Range   Vitamin B-12 302 200 - 1,100 pg/mL  VITAMIN D 25 Hydroxy (Vit-D Deficiency, Fractures)  Result Value Ref Range   Vit D, 25-Hydroxy 34 30 - 100 ng/mL  POCT urinalysis dipstick  Result Value Ref Range   Color, UA yellow yellow   Clarity, UA clear clear   Glucose, UA negative negative   Bilirubin, UA negative negative   Ketones, POC UA negative negative   Spec Grav, UA 1.015    Blood, UA negative negative   pH, UA 7.0    Protein Ur, POC negative negative   Urobilinogen, UA 0.2    Nitrite, UA Negative Negative   Leukocytes, UA Negative Negative   Fall Risk  01/02/2016  10/02/2015 12/01/2014 05/24/2014 10/26/2013  Falls in the past year? No Yes Yes Yes Yes  Number falls in past yr: - 2 or more 2 or more 1 2 or more  Injury with Fall? - Yes - Yes -   Depression screen [facility] 2/9 01/02/2016 10/02/2015 12/01/2014 05/24/2014 10/26/2013  Decreased Interest 0 0 0 0 0  Down, Depressed, Hopeless 0 0 0 0 0  PHQ - 2 Score 0 0 0 0 0       Assessment & Plan:   1. Routine physical examination   2. Flu vaccine need   3. Atrial fibrillation with RVR ([place])   4. Chronic obstructive pulmonary disease, unspecified COPD type ([place])   5. Chronic seasonal allergic rhinitis due to pollen   6. Gastroesophageal reflux disease without esophagitis   7. Glucose intolerance (impaired glucose tolerance)   8. Hypothyroidism due to acquired atrophy of thyroid   9. Osteopenia of multiple sites   10. Spondylosis of thoracic region  without myelopathy or radiculopathy   11. Spondylosis of lumbar region without myelopathy or radiculopathy   12. Chronic pain syndrome   13. Avitaminosis D   14. Anxiety and depression   15. Pure hypercholesterolemia   16. Dermatitis   17. Class 2 obesity due to excess calories with serious comorbidity and body mass index (BMI) of 36.0 to 36.9 in adult    -anticipatory guidance --- exercise, weight loss, three servings of calcium daily. -obtain labs. Refills provided. -refer to rheumatology to rule out autoimmune etiology to arthralgias, myalgias. -chronic pain managed by pain management; no recurrent altered mental status since hospitalization. -chronic anxiety/depression managed by psychiatry; benzo dose decreased but has not been discontinued. -maintained on Xarelto for atrial fibrillation detected during admission for altered mental status with aspiration pneumonia with accidental narcotic overdose.  Maintained on Cardizem as well; managed by cardiology.   Orders Placed This Encounter  Procedures  . Flu Vaccine QUAD 36+ mos IM  . Pneumococcal polysaccharide  vaccine 23-valent greater than or equal to 2yo subcutaneous/IM  . CBC with Differential/Platelet  . Comprehensive metabolic panel    Order Specific Question:   Has the patient fasted?    Answer:   Yes  . Hemoglobin A1c  . Lipid panel    Order Specific Question:   Has the patient fasted?    Answer:   Yes  . T4, free  . TSH  . Vitamin B12  . VITAMIN D 25 Hydroxy (Vit-D Deficiency, Fractures)  . Ambulatory referral to Rheumatology    Referral Priority:   Routine    Referral Type:   Consultation    Referral Reason:   Specialty Services Required    Requested Specialty:   Rheumatology    Number of Visits Requested:   1  . POCT urinalysis dipstick   Meds ordered this encounter  Medications  . pregabalin (LYRICA) 50 MG capsule    Sig: Take 50 mg by mouth 2 (two) times daily.  . Tapentadol HCl (NUCYNTA PO)    Sig: Take 100 mg by mouth 4 (four) times daily.  . fexofenadine (ALLEGRA) 180 MG tablet    Sig: Take 1 tablet (180 mg total) by mouth daily.    Dispense:  90 tablet    Refill:  3    Return in about 3 months (around 04/03/2016) for recheck.   Lovelle Deitrick Elayne Guerin, M.D. [facility] [address] [place], [place]  [address] [phone] phone [phone] fax

## 2016-01-02 NOTE — Patient Instructions (Addendum)
   IF you received an x-ray today, you will receive an invoice from [facility]. Please contact [facility] at [phone] with questions or concerns regarding your invoice.   IF you received labwork today, you will receive an invoice from [facility]. Please contact [facility] at [phone] with questions or concerns regarding your invoice.   Our billing staff will not be able to assist you with questions regarding bills from these companies.  You will be contacted with the lab results as soon as they are available. The fastest way to get your results is to activate your My Chart account. Instructions are located on the last page of this paperwork. If you have not heard from us regarding the results in 2 weeks, please contact this office.    Keeping You Healthy  Get These Tests  Blood Pressure- Have your blood pressure checked by your healthcare provider at least once a year.  Normal blood pressure is 120/80.  Weight- Have your body mass index (BMI) calculated to screen for obesity.  BMI is a measure of body fat based on height and weight.  You can calculate your own BMI at www.nhlbisupport.com/bmi/  Cholesterol- Have your cholesterol checked every year.  Diabetes- Have your blood sugar checked every year if you have high blood pressure, high cholesterol, a family history of diabetes or if you are overweight.  Pap Test - Have a pap test every 1 to 5 years if you have been sexually active.  If you are older than 65 and recent pap tests have been normal you may not need additional pap tests.  In addition, if you have had a hysterectomy  for benign disease additional pap tests are not necessary.  Mammogram-Yearly mammograms are essential for early detection of breast cancer  Screening for Colon Cancer- Colonoscopy starting at age 50. Screening may begin sooner depending on your family history and other health conditions.  Follow up colonoscopy  as directed by your Gastroenterologist.  Screening for Osteoporosis- Screening begins at age 65 with bone density scanning, sooner if you are at higher risk for developing Osteoporosis.  Get these medicines  Calcium with Vitamin D- Your body requires 1200-1500 mg of Calcium a day and 800-1000 IU of Vitamin D a day.  You can only absorb 500 mg of Calcium at a time therefore Calcium must be taken in 2 or 3 separate doses throughout the day.  Hormones- Hormone therapy has been associated with increased risk for certain cancers and heart disease.  Talk to your healthcare provider about if you need relief from menopausal symptoms.  Aspirin- Ask your healthcare provider about taking Aspirin to prevent Heart Disease and Stroke.  Get these Immuniztions  Flu shot- Every fall  Pneumonia shot- Once after the age of 65; if you are younger ask your healthcare provider if you need a pneumonia shot.  Tetanus- Every ten years.  Zostavax- Once after the age of 60 to prevent shingles.  Take these steps  Don't smoke- Your healthcare provider can help you quit. For tips on how to quit, ask your healthcare provider or go to www.smokefree.gov or call 1-800 QUIT-NOW.  Be physically active- Exercise 5 days a week for a minimum of 30 minutes.  If you are not already physically active, start slow and gradually work up to 30 minutes of moderate physical activity.  Try walking, dancing, bike riding, swimming, etc.  Eat a healthy diet- Eat a variety of healthy foods such as fruits, vegetables, whole   grains, low fat milk, low fat cheeses, yogurt, lean meats, chicken, fish, eggs, dried beans, tofu, etc.  For more information go to www.thenutritionsource.org  Dental visit- Brush and floss teeth twice daily; visit your dentist twice a year.  Eye exam- Visit your Optometrist or Ophthalmologist yearly.  Drink alcohol in moderation- Limit alcohol intake to one drink or less a day.  Never drink and  drive.  Depression- Your emotional health is as important as your physical health.  If you're feeling down or losing interest in things you normally enjoy, please talk to your healthcare provider.  Seat Belts- can save your life; always wear one  Smoke/Carbon Monoxide detectors- These detectors need to be installed on the appropriate level of your home.  Replace batteries at least once a year.  Violence- If anyone is threatening or hurting you, please tell your healthcare provider.  Living Will/ Health care power of attorney- Discuss with your healthcare provider and family. 

## 2016-01-03 LAB — HEMOGLOBIN A1C
Hgb A1c MFr Bld: 5.5 % (ref <5.7)
Mean Plasma Glucose: 111 mg/dL

## 2016-01-11 ENCOUNTER — Ambulatory Visit (INDEPENDENT_AMBULATORY_CARE_PROVIDER_SITE_OTHER): Payer: BLUE CROSS/BLUE SHIELD | Admitting: Cardiovascular Disease

## 2016-01-11 ENCOUNTER — Encounter: Payer: Self-pay | Admitting: Cardiovascular Disease

## 2016-01-11 VITALS — BP 126/86 | HR 74 | Ht 61.0 in | Wt 201.0 lb

## 2016-01-11 DIAGNOSIS — I480 Paroxysmal atrial fibrillation: Secondary | ICD-10-CM | POA: Diagnosis not present

## 2016-01-11 DIAGNOSIS — M47814 Spondylosis without myelopathy or radiculopathy, thoracic region: Secondary | ICD-10-CM

## 2016-01-11 DIAGNOSIS — E7800 Pure hypercholesterolemia, unspecified: Secondary | ICD-10-CM | POA: Diagnosis not present

## 2016-01-11 DIAGNOSIS — J690 Pneumonitis due to inhalation of food and vomit: Secondary | ICD-10-CM

## 2016-01-11 DIAGNOSIS — J449 Chronic obstructive pulmonary disease, unspecified: Secondary | ICD-10-CM

## 2016-01-11 NOTE — Patient Instructions (Signed)

## 2016-01-11 NOTE — Progress Notes (Signed)
Cardiology Office Note  Date:  01/11/2016   ID:  Sonya [name], DOB 06-Dec-1950, MRN [id number]  PCP:  Reginia Forts, MD   Chief Complaint  Patient presents with  . other     11m f/u. Pt states she is doing well. Reviewed meds verbally with pt.    HPI:  Sonya [name] is a 65 year old woman with a long history of smoking who stopped 4 years ago, chronic back pain with Harrington rods placed, anemia with iron deficiency, emphysema seen on CT scan, recent pneumonia x2 in May and August 2011, symptoms of swelling in her legs, workup with echocardiogram and stress test, previous symptoms of fatigue, shortness of breath and edema. She presents today for followup after episodes of chest pain. H/o depression  Previous stress test 04/2013  [place], broke rod in her back  Admission to hospital for narcoic overdose: 09/10/2015 Found by husband CXR with large right side infiultrate Given lasix, ABX, aspiated,  Diagnosed with atrial fibrillation 09/12/2015 NSR on 10/02/2015 On xarelto   Echo 09/11/2015: EF 50 to 55% RVSP 40  Mg  "Tired a lot"  chronic back pain, history of total knee replacements bilaterally in 2014,  shortness of breath with exertion, chronic fatigue.  Total 155, LDL 88 HBA1C 5.5  Open sores on her skin, arms  EKG shows normal sinus rhythm with rate of 74 beats per minute, no significant ST-T wave changes   stress test September 26 2009 showed no ischemia, poor exercise tolerance for her age, ejection fraction 71%   Echocardiogram done June 2011 shows normal systolic function, mild MR, normal right systolic function and pressure     PMH:   has a past medical history of Allergic rhinitis, cause unspecified; Anemia; Anxiety; Arthritis; Benign neoplasm of colon; Bruising; Chest pain; Chronic pain syndrome; Degeneration of lumbar or lumbosacral intervertebral disc; Depressive disorder, not elsewhere classified; Edema; Emphysema; GERD (gastroesophageal reflux disease);  Hematuria, unspecified; Hiatal hernia; HLD (hyperlipidemia); Hyperglycemia; Insomnia, unspecified; Lumbosacral spondylosis without myelopathy; Neurotic excoriations; Osteoporosis, unspecified; PAF (paroxysmal atrial fibrillation) ([place]); Personal history of urinary calculi; Pneumonia; Prurigo nodularis; Pulmonary hypertension; Pure hypercholesterolemia; Scoliosis (and kyphoscoliosis), idiopathic; Squamous cell carcinoma; Tobacco use disorder; Unspecified hypothyroidism; Unspecified vitamin D deficiency; and UTI (urinary tract infection).  PSH:    Past Surgical History:  Procedure Laterality Date  . 2 harrington rods in back  1980  . ABDOMINAL HYSTERECTOMY     endometriosis; ovaries resected; no cancer.  Lillard Anes  2006   both feet  . CATARACT EXTRACTION, BILATERAL    . [place]  . EYE SURGERY    . JOINT REPLACEMENT  03/16/12   L TKR; R TKR  . lumbar spine surgery  [place]  . squamous cell carcinoma resection  02/2010   [name]  . TONSILLECTOMY AND ADENOIDECTOMY  1964  . VAGINAL HYSTERECTOMY  1990    Current Outpatient Prescriptions  Medication Sig Dispense Refill  . AMITIZA 24 MCG capsule TAKE 1 CAPSULE BY MOUTH TWICE DAILY AS NEEDED  2  . baclofen (LIORESAL) 10 MG tablet Take 0.5 tablets (5 mg total) by mouth 2 (two) times daily. 60 tablet 0  . budesonide (PULMICORT FLEXHALER) 180 MCG/ACT inhaler Inhale 1 puff into the lungs 2 (two) times daily.    . clonazePAM (KLONOPIN) 0.5 MG tablet Take 1 tablet (0.5 mg total) by mouth 2 (two) times daily. 180 tablet 1  . diltiazem (CARDIZEM CD) 240 MG 24 hr capsule Take 1 capsule (240  mg total) by mouth daily. 90 capsule 3  . DULoxetine (CYMBALTA) 60 MG capsule Take 1 capsule (60 mg total) by mouth daily. 90 capsule 1  . fexofenadine (ALLEGRA) 180 MG tablet Take 1 tablet (180 mg total) by mouth daily. 90 tablet 3  . levalbuterol (XOPENEX HFA) 45 MCG/ACT inhaler Inhale 2 puffs into the lungs every 6  (six) hours as needed for wheezing or shortness of breath. 1 Inhaler 12  . levalbuterol (XOPENEX) 1.25 MG/3ML nebulizer solution Inhale into the lungs.    Marland Kitchen levothyroxine (SYNTHROID) 50 MCG tablet Take 1 tablet (50 mcg total) by mouth daily before breakfast. 90 tablet 3  . LYRICA 75 MG capsule Take 75 mg by mouth 2 (two) times daily.  1  . montelukast (SINGULAIR) 10 MG tablet Take 10 mg by mouth at bedtime.  11  . OXYGEN Inhale 2 L into the lungs at bedtime.    . pantoprazole (PROTONIX) 40 MG tablet Take 1 tablet (40 mg total) by mouth daily at 12 noon. 90 tablet 3  . pregabalin (LYRICA) 50 MG capsule Take 50 mg by mouth 2 (two) times daily.    . rivaroxaban (XARELTO) 20 MG TABS tablet Take 1 tablet (20 mg total) by mouth daily with supper. 90 tablet 3  . rosuvastatin (CRESTOR) 20 MG tablet Take 1 tablet (20 mg total) by mouth daily. 90 tablet 3  . Tapentadol HCl (NUCYNTA PO) Take 100 mg by mouth 4 (four) times daily.    Marland Kitchen tretinoin (RETIN-A) 0.1 % cream Apply a pea sized amount to the face nightly 20 min after washing, avoiding area around the eyes and creases around the nose.     No current facility-administered medications for this visit.      Allergies:   Latex; Levofloxacin; and Sulfonamide derivatives   Social History:  The patient  reports that she quit smoking about 9 years ago. Her smoking use included Cigarettes. She started smoking about 31 years ago. She has a 25.00 pack-year smoking history. She has never used smokeless tobacco. She reports that she does not drink alcohol or use drugs.   Family History:   family history includes Breast cancer in her cousin; Cancer in her maternal grandmother; Cancer (age of onset: 79) in her father; Coronary artery disease in her brother; Heart disease in her brother, brother, brother, brother, father, and mother; Heart disease (age of onset: 53) in her sister; Heart failure in her brother, father, and mother; Hypertension in her mother.     Review of Systems: Review of Systems  Constitutional: Positive for malaise/fatigue.  Respiratory: Negative.   Cardiovascular: Negative.   Gastrointestinal: Negative.   Musculoskeletal: Negative.   Neurological: Positive for weakness.  Psychiatric/Behavioral: Negative.   All other systems reviewed and are negative.    PHYSICAL EXAM: VS:  BP 126/86 (BP Location: Left Arm, Patient Position: Sitting, Cuff Size: Normal)   Pulse 74   Ht 5\' 1"  (1.549 m)   Wt 201 lb (91.2 kg)   BMI 37.98 kg/m  , BMI Body mass index is 37.98 kg/m. GEN: Well nourished, well developed, in no acute distress, obese  HEENT: normal  Neck: no JVD, carotid bruits, or masses Cardiac: RRR; no murmurs, rubs, or gallops,no edema  Respiratory:  clear to auscultation bilaterally, normal work of breathing GI: soft, nontender, nondistended, + BS MS: no deformity or atrophy  Skin: warm and dry, no rash Neuro:  Strength and sensation are intact Psych: euthymic mood, full affect    Recent  Labs: 09/09/2015: B Natriuretic Peptide 462.1 09/13/2015: Magnesium 1.9 01/02/2016: ALT 17; BUN 8; Creat 0.82; Hemoglobin 11.5; Platelets 280; Potassium 4.9; Sodium 141; TSH 2.03    Lipid Panel Lab Results  Component Value Date   CHOL 155 01/02/2016   HDL 46 (L) 01/02/2016   LDLCALC 88 01/02/2016   TRIG 106 01/02/2016      Wt Readings from Last 3 Encounters:  01/11/16 201 lb (91.2 kg)  01/02/16 198 lb 9.6 oz (90.1 kg)  10/12/15 188 lb 8 oz (85.5 kg)       ASSESSMENT AND PLAN:  Paroxysmal atrial fibrillation (HCC) - Plan: EKG 12-Lead Maintaining normal sinus rhythm Tolerating anticoagulation and Cardizem No medication changes made Long discussion concerning her atrial fibrillation, how to monitor her heart rate Recommended she call us for palpitations concerning for arrhythmia Encouraged weight loss  Pure hypercholesterolemia Cholesterol is at goal on the current lipid regimen. No changes to the  medications were made.  Aspiration pneumonia of right middle lobe due to vomit [facility]) Hospital records reviewed Recovered from her aspiration pneumonia several months ago, Has follow-up with pulmonary  Spondylosis of thoracic region without myelopathy or radiculopathy Chronic back pain, unable to exercise, gait instability  Chronic obstructive pulmonary disease, unspecified COPD type ([place]) Reports breathing is stable, on inhalers  Morbid obesity We have encouraged continued exercise, careful diet management in an effort to lose weight.   Total encounter time more than 25 minutes  Greater than 50% was spent in counseling and coordination of care with the patient   Disposition:   F/U  6 months   Orders Placed This Encounter  Procedures  . EKG 12-Lead     Signed, Esmond Plants, M.D., Ph.D. 01/11/2016  [place], [place]

## 2016-02-05 ENCOUNTER — Ambulatory Visit (INDEPENDENT_AMBULATORY_CARE_PROVIDER_SITE_OTHER): Payer: BLUE CROSS/BLUE SHIELD | Admitting: Psychiatry

## 2016-02-05 ENCOUNTER — Encounter: Payer: Self-pay | Admitting: Psychiatry

## 2016-02-05 VITALS — BP 133/84 | HR 97 | Wt 201.0 lb

## 2016-02-05 DIAGNOSIS — F331 Major depressive disorder, recurrent, moderate: Secondary | ICD-10-CM | POA: Diagnosis not present

## 2016-02-05 MED ORDER — CLONAZEPAM 0.5 MG PO TABS: 0.5000 mg | ORAL_TABLET | Freq: Two times a day (BID) | ORAL | 1 refills | Status: DC

## 2016-02-05 MED ORDER — DULOXETINE HCL 60 MG PO CPEP: 60.0000 mg | ORAL_CAPSULE | Freq: Every day | ORAL | 1 refills | Status: DC

## 2016-02-05 NOTE — Progress Notes (Signed)
Follow-up for patient with recurrent depression and anxiety. No new complaints. States that her mood is good. Not feeling anxious. She does have some chronic memory problems which have been noted previously. Also complains of some constipation.  Neatly dressed and groomed. Good eye contact. Normal affect. Appropriate interaction. No suicidal or homicidal ideation. No sign of psychosis. Alert and oriented 4. Good judgment and insight.  No evidence of abuse of medicine. Continue current medications for depression and anxiety. Encourage patient to use Colace) MiraLAX to help with her chronic constipation. Follow-up in 6 months.

## 2016-04-08 ENCOUNTER — Encounter: Payer: Self-pay | Admitting: Family Medicine

## 2016-04-08 ENCOUNTER — Ambulatory Visit (INDEPENDENT_AMBULATORY_CARE_PROVIDER_SITE_OTHER): Payer: BLUE CROSS/BLUE SHIELD | Admitting: Family Medicine

## 2016-04-08 VITALS — BP 140/92 | HR 95 | Temp 98.6°F | Resp 16 | Ht 61.0 in | Wt 207.8 lb

## 2016-04-08 DIAGNOSIS — E7800 Pure hypercholesterolemia, unspecified: Secondary | ICD-10-CM | POA: Diagnosis not present

## 2016-04-08 DIAGNOSIS — J449 Chronic obstructive pulmonary disease, unspecified: Secondary | ICD-10-CM

## 2016-04-08 DIAGNOSIS — E034 Atrophy of thyroid (acquired): Secondary | ICD-10-CM | POA: Diagnosis not present

## 2016-04-08 DIAGNOSIS — K219 Gastro-esophageal reflux disease without esophagitis: Secondary | ICD-10-CM | POA: Diagnosis not present

## 2016-04-08 DIAGNOSIS — L981 Factitial dermatitis: Secondary | ICD-10-CM

## 2016-04-08 DIAGNOSIS — I878 Other specified disorders of veins: Secondary | ICD-10-CM

## 2016-04-08 DIAGNOSIS — I4891 Unspecified atrial fibrillation: Secondary | ICD-10-CM | POA: Diagnosis not present

## 2016-04-08 DIAGNOSIS — M170 Bilateral primary osteoarthritis of knee: Secondary | ICD-10-CM

## 2016-04-08 DIAGNOSIS — R7302 Impaired glucose tolerance (oral): Secondary | ICD-10-CM

## 2016-04-08 DIAGNOSIS — D509 Iron deficiency anemia, unspecified: Secondary | ICD-10-CM | POA: Diagnosis not present

## 2016-04-08 DIAGNOSIS — G894 Chronic pain syndrome: Secondary | ICD-10-CM

## 2016-04-08 DIAGNOSIS — M8589 Other specified disorders of bone density and structure, multiple sites: Secondary | ICD-10-CM

## 2016-04-08 NOTE — Progress Notes (Signed)
Subjective:    Patient ID: Sonya [name], female    DOB: 1950-06-09, 66 y.o.   MRN: [id number]  04/08/2016  Follow-up (3 months/ bp, labs)   HPI This 66 y.o. female presents for three month follow-up of hypertension, hypercholesterolemia, glucose intolerance.  Very upset with weight.  Barely does anything around the house.  Does not eat a lot but must be eating the wrong things; also drinks pepsi.  Was down to one per day.   B:  Drinks either orange juice or pineapple juice or grapefruit juice, 2 teaspoons of apple cider vinegar, honey 100% pure.  Does it once per day; does get energy. No energy in afternoon. Drinks water; drinks pepsi in morning. No coffee.  2 small Activias per day.  L: soup with beans/chili/bean with bacon soup, chicken breast or pork chops S: popcorn  Husband eats cheese and crackers all day.  Husband eats sandwich for lunch with chips.    Has been staying up until 10:00pm.   REQUESTING NEW SHINGLES VACCINE.  Suffers with a lot of pain; taking Baclofen 5mg /hydrocodone 10/325, Lyrica 50mg  bid. No longer taking Nucynta; insurance will not cover as of 02/25/16.  No longer taking Lasix any longer.   R foot and ankle are hurting again: history of R ankle fracture. Wears a good supportive tennis shoe.  Limping.  Hurts with ambulation.  Followed by [facility]/derm in [place]; skin improving.   Immunization History  Administered Date(s) Administered  . Influenza Split 11/10/2011  . Influenza,inj,Quad PF,36+ Mos 12/20/2012, 10/26/2013, 12/01/2014, 01/02/2016  . Influenza-Unspecified 11/14/2009, 12/25/2010  . Pneumococcal Polysaccharide-23 01/02/2016  . Pneumococcal-Unspecified 12/18/2009  . Tdap 12/25/2010  . Zoster 02/24/2014   BP Readings from Last 3 Encounters:  04/08/16 (!) 140/92  01/11/16 126/86  01/02/16 118/76   Wt Readings from Last 3 Encounters:  04/08/16 207 lb 12.8 oz (94.3 kg)  01/11/16 201 lb (91.2 kg)  01/02/16 198 lb 9.6 oz (90.1 kg)     Review of Systems  Constitutional: Positive for unexpected weight change. Negative for chills, diaphoresis, fatigue and fever.  Eyes: Negative for visual disturbance.  Respiratory: Negative for cough and shortness of breath.   Cardiovascular: Negative for chest pain, palpitations and leg swelling.  Gastrointestinal: Negative for abdominal pain, constipation, diarrhea, nausea and vomiting.  Endocrine: Negative for cold intolerance, heat intolerance, polydipsia, polyphagia and polyuria.  Musculoskeletal: Positive for arthralgias, back pain, gait problem, joint swelling and myalgias.  Neurological: Negative for dizziness, tremors, seizures, syncope, facial asymmetry, speech difficulty, weakness, light-headedness, numbness and headaches.  Psychiatric/Behavioral: Positive for dysphoric mood. Negative for hallucinations. The patient is nervous/anxious.     Past Medical History:  Diagnosis Date  . Allergic rhinitis, cause unspecified   . Anemia   . Anxiety   . Arthritis   . Benign neoplasm of colon   . Bruising   . Chest pain    a. 2015 Neg MV.  . Chronic pain syndrome   . Degeneration of lumbar or lumbosacral intervertebral disc   . Depressive disorder, not elsewhere classified   . Edema   . Emphysema   . GERD (gastroesophageal reflux disease)   . Hematuria, unspecified   . Hiatal hernia   . HLD (hyperlipidemia)   . Hyperglycemia   . Insomnia, unspecified   . Lumbosacral spondylosis without myelopathy   . Neurotic excoriations   . Osteoporosis, unspecified   . PAF (paroxysmal atrial fibrillation) ([place])    a. 08/2015 in setting of asp pna-->converted on dilt. Xarelto started (  CHA2DS2VASc= 1).  . Personal history of urinary calculi   . Pneumonia   . Prurigo nodularis   . Pulmonary hypertension    a. 08/2015 Echo: EF 50-55%, no rwma, gr1 DD, mild MR, PASP 35mmHg.   Marland Kitchen Pure hypercholesterolemia   . Scoliosis (and kyphoscoliosis), idiopathic   . Squamous cell carcinoma   .  Tobacco use disorder   . Unspecified hypothyroidism   . Unspecified vitamin D deficiency   . UTI (urinary tract infection)    frequent   Past Surgical History:  Procedure Laterality Date  . 2 harrington rods in back  1980  . ABDOMINAL HYSTERECTOMY     endometriosis; ovaries resected; no cancer.  Lillard Anes  2006   both feet  . CATARACT EXTRACTION, BILATERAL    . [place]  . EYE SURGERY    . JOINT REPLACEMENT  03/16/12   L TKR; R TKR  . lumbar spine surgery  [place]  . squamous cell carcinoma resection  02/2010   [name]  . TONSILLECTOMY AND ADENOIDECTOMY  1964  . VAGINAL HYSTERECTOMY  1990   Allergies  Allergen Reactions  . Latex Other (See Comments)    OPEN SORES  . Levofloxacin   . Sulfonamide Derivatives     Social History   Social History  . Marital status: Married    Spouse name: Jeneice Risinger  . Number of children: 1  . Years of education: 49   Occupational History  . disabled     65   DDD   Social History Main Topics  . Smoking status: Former Smoker    Packs/day: 1.00    Years: 25.00    Types: Cigarettes    Start date: 11/05/1984    Quit date: 11/06/2006  . Smokeless tobacco: Never Used     Comment: quit 11/07  . Alcohol use No  . Drug use: No  . Sexual activity: Yes    Partners: Male   Other Topics Concern  . Not on file   Social History Narrative   Marital status:  Married since 1992; second marriage; happily married; no abuse.      Children:  1 daughter; 3 stepchildren; 8 grandchildren; 2 gg.   Daughter in [place].  Husband's children in [place].      Employment:  Retired/disabled due to DDD lumbar.       Lives:   Lives with spouse;      Caffeine use cosumes a moderate amount 1 serving per day.      Always uses seat belts.       No guns in home.       Smoke alarm and carbon monoxide detectors in the home.       No Living Will.   Family History  Problem Relation Age of Onset  . Coronary  artery disease Brother     stents    SIster stents also  . Heart disease Brother   . Heart failure Brother     CABG  AMI @ 39  . Heart disease Brother   . Heart failure Mother     pacemaker  . Hypertension Mother   . Heart disease Mother     CHF; pacemaker  . Heart failure Father     AMI  . Cancer Father 62    colon cancer  . Heart disease Father     mutliple AMIs  . Cancer Maternal Grandmother     ovarian  .  Heart disease Sister 63    cardiac stenting  . Heart disease Brother   . Heart disease Brother   . GI problems      Family history malignancy, GI tract  . Breast cancer Cousin        Objective:    BP (!) 140/92   Pulse 95   Temp 98.6 F (37 C) (Oral)   Resp 16   Ht 5\' 1"  (1.549 m)   Wt 207 lb 12.8 oz (94.3 kg)   SpO2 95%   BMI 39.26 kg/m  Physical Exam  Constitutional: She is oriented to person, place, and time. She appears well-developed and well-nourished. No distress.  HENT:  Head: Normocephalic and atraumatic.  Right Ear: External ear normal.  Left Ear: External ear normal.  Nose: Nose normal.  Mouth/Throat: Oropharynx is clear and moist.  Eyes: Conjunctivae and EOM are normal. Pupils are equal, round, and reactive to light.  Neck: Normal range of motion. Neck supple. Carotid bruit is not present. No thyromegaly present.  Cardiovascular: Normal rate, regular rhythm, normal heart sounds and intact distal pulses.  Exam reveals no gallop and no friction rub.   No murmur heard. Pulmonary/Chest: Effort normal and breath sounds normal. She has no wheezes. She has no rales.  Abdominal: Soft. Bowel sounds are normal. She exhibits no distension and no mass. There is no tenderness. There is no rebound and no guarding.  Lymphadenopathy:    She has no cervical adenopathy.  Neurological: She is alert and oriented to person, place, and time. No cranial nerve deficit.  Skin: Skin is warm and dry. No rash noted. She is not diaphoretic. No erythema. No pallor.   Diffuse scarring along four extremities.  Few active lesions with excoriations.  Psychiatric: She has a normal mood and affect. Her behavior is normal.   Depression screen [facility] 2/9 04/08/2016 01/02/2016 10/02/2015 12/01/2014 05/24/2014  Decreased Interest 0 0 0 0 0  Down, Depressed, Hopeless 0 0 0 0 0  PHQ - 2 Score 0 0 0 0 0   Fall Risk  04/08/2016 01/02/2016 10/02/2015 12/01/2014 05/24/2014  Falls in the past year? No No Yes Yes Yes  Number falls in past yr: - - 2 or more 2 or more 1  Injury with Fall? - - Yes - Yes       Assessment & Plan:   1. Hypothyroidism due to acquired atrophy of thyroid   2. Pure hypercholesterolemia   3. Iron deficiency anemia, unspecified iron deficiency anemia type   4. Atrial fibrillation with RVR ([place])   5. Chronic obstructive pulmonary disease, unspecified COPD type ([place])   6. Glucose intolerance (impaired glucose tolerance)   7. Gastroesophageal reflux disease without esophagitis   8. Primary osteoarthritis of both knees   9. Neurotic excoriations   10. Osteopenia of multiple sites   11. Chronic pain syndrome   12. Venous stasis    -stable at this time.   -obtain labs.  -encourage increased activity level and exercise.   -continue current medications.   Orders Placed This Encounter  Procedures  . CBC with Differential/Platelet  . Comprehensive metabolic panel  . TSH  . T4, free   Meds ordered this encounter  Medications  . HYDROcodone-acetaminophen (NORCO) 10-325 MG tablet    Sig: TAKE 1 TABLET BY MOUTH 4 TIMES A DAY AS NEEDED    Refill:  0    Return in about 4 months (around 08/06/2016) for recheck.   Maebry Obrien Elayne Guerin, M.D. Primary  Care at [facility] [place] [address] [place], [place]  [address] [phone] phone [phone] fax

## 2016-04-08 NOTE — Patient Instructions (Addendum)
[email or web address]    IF you received an x-ray today, you will receive an invoice from [facility] Radiology. Please contact [facility] at [phone] with questions or concerns regarding your invoice.   IF you received labwork today, you will receive an invoice from [place]. Please contact [facility] at [phone] with questions or concerns regarding your invoice.   Our billing staff will not be able to assist you with questions regarding bills from these companies.  You will be contacted with the lab results as soon as they are available. The fastest way to get your results is to activate your My Chart account. Instructions are located on the last page of this paperwork. If you have not heard from [place] regarding the results in 2 weeks, please contact this office.      Fat and Cholesterol Restricted Diet Introduction Getting too much fat and cholesterol in your diet may cause health problems. Following this diet helps keep your fat and cholesterol at normal levels. This can keep you from getting sick. What types of fat should I choose?  Choose monosaturated and polyunsaturated fats. These are found in foods such as olive oil, canola oil, flaxseeds, walnuts, almonds, and seeds.  Eat more omega-3 fats. Good choices include salmon, mackerel, sardines, tuna, flaxseed oil, and ground flaxseeds.  Limit saturated fats. These are in animal products such as meats, butter, and cream. They can also be in plant products such as palm oil, palm kernel oil, and coconut oil.  Avoid foods with partially hydrogenated oils in them. These contain trans fats. Examples of foods that have trans fats are stick margarine, some tub margarines, cookies, crackers, and other baked goods. What general guidelines do I need to follow?  Check food labels. Look for the words "trans fat" and "saturated fat."  When preparing a meal:  Fill half of your plate with vegetables and green salads.  Fill one fourth  of your plate with whole grains. Look for the word "whole" as the first word in the ingredient list.  Fill one fourth of your plate with lean protein foods.  Eat more foods that have fiber, like apples, carrots, beans, peas, and barley.  Eat more home-cooked foods. Eat less at restaurants and buffets.  Limit or avoid alcohol.  Limit foods high in starch and sugar.  Limit fried foods.  Cook foods without frying them. Baking, boiling, grilling, and broiling are all great options.  Lose weight if you are overweight. Losing even a small amount of weight can help your overall health. It can also help prevent diseases such as diabetes and heart disease. What foods can I eat? Grains  Whole grains, such as whole wheat or whole grain breads, crackers, cereals, and pasta. Unsweetened oatmeal, bulgur, barley, quinoa, or brown rice. Corn or whole wheat flour tortillas. Vegetables  Fresh or frozen vegetables (raw, steamed, roasted, or grilled). Green salads. Fruits  All fresh, canned (in natural juice), or frozen fruits. Meat and Other Protein Products  Ground beef (85% or leaner), grass-fed beef, or beef trimmed of fat. Skinless chicken or [place]. Ground chicken or [place]. Pork trimmed of fat. All fish and seafood. Eggs. Dried beans, peas, or lentils. Unsalted nuts or seeds. Unsalted canned or dry beans. Dairy  Low-fat dairy products, such as skim or 1% milk, 2% or reduced-fat cheeses, low-fat ricotta or cottage cheese, or plain low-fat yogurt. Fats and Oils  Tub margarines without trans fats. Light or reduced-fat mayonnaise and salad dressings. Avocado. Olive, canola, sesame, or safflower oils.  Natural peanut or almond butter (choose ones without added sugar and oil). The items listed above may not be a complete list of recommended foods or beverages. Contact your dietitian for more options.  What foods are not recommended? Grains  White bread. White pasta. White rice. Cornbread. Bagels,  pastries, and croissants. Crackers that contain trans fat. Vegetables  White potatoes. Corn. Creamed or fried vegetables. Vegetables in a cheese sauce. Fruits  Dried fruits. Canned fruit in light or heavy syrup. Fruit juice. Meat and Other Protein Products  Fatty cuts of meat. Ribs, chicken wings, bacon, sausage, bologna, salami, chitterlings, fatback, hot dogs, bratwurst, and packaged luncheon meats. Liver and organ meats. Dairy  Whole or 2% milk, cream, half-and-half, and cream cheese. Whole milk cheeses. Whole-fat or sweetened yogurt. Full-fat cheeses. Nondairy creamers and whipped toppings. Processed cheese, cheese spreads, or cheese curds. Sweets and Desserts  Corn syrup, sugars, honey, and molasses. Candy. Jam and jelly. Syrup. Sweetened cereals. Cookies, pies, cakes, donuts, muffins, and ice cream. Fats and Oils  Butter, stick margarine, lard, shortening, ghee, or bacon fat. Coconut, palm kernel, or palm oils. Beverages  Alcohol. Sweetened drinks (such as sodas, lemonade, and fruit drinks or punches). The items listed above may not be a complete list of foods and beverages to avoid. Contact your dietitian for more information.  This information is not intended to replace advice given to you by your health care provider. Make sure you discuss any questions you have with your health care provider. Document Released: 08/12/2011 Document Revised: 10/18/2015 Document Reviewed: 05/12/2013  2017 [employer]vier

## 2016-04-09 LAB — CBC WITH DIFFERENTIAL/PLATELET
BASOS ABS: 0 10*3/uL (ref 0.0–0.2)
Basos: 0 %
EOS (ABSOLUTE): 0 10*3/uL (ref 0.0–0.4)
Eos: 0 %
HEMATOCRIT: 35.8 % (ref 34.0–46.6)
HEMOGLOBIN: 10.7 g/dL — AB (ref 11.1–15.9)
Immature Grans (Abs): 0 10*3/uL (ref 0.0–0.1)
Immature Granulocytes: 0 %
LYMPHS ABS: 1.9 10*3/uL (ref 0.7–3.1)
Lymphs: 22 %
MCH: 22.9 pg — ABNORMAL LOW (ref 26.6–33.0)
MCHC: 29.9 g/dL — AB (ref 31.5–35.7)
MCV: 77 fL — ABNORMAL LOW (ref 79–97)
MONOCYTES: 8 %
Monocytes Absolute: 0.7 10*3/uL (ref 0.1–0.9)
NEUTROS ABS: 6.1 10*3/uL (ref 1.4–7.0)
Neutrophils: 70 %
Platelets: 293 10*3/uL (ref 150–379)
RBC: 4.67 x10E6/uL (ref 3.77–5.28)
RDW: 16.8 % — ABNORMAL HIGH (ref 12.3–15.4)
WBC: 8.7 10*3/uL (ref 3.4–10.8)

## 2016-04-09 LAB — T4, FREE: FREE T4: 1.32 ng/dL (ref 0.82–1.77)

## 2016-04-09 LAB — COMPREHENSIVE METABOLIC PANEL
ALBUMIN: 4.3 g/dL (ref 3.6–4.8)
ALK PHOS: 93 IU/L (ref 39–117)
ALT: 16 IU/L (ref 0–32)
AST: 24 IU/L (ref 0–40)
Albumin/Globulin Ratio: 1.9 (ref 1.2–2.2)
BUN / CREAT RATIO: 16 (ref 12–28)
BUN: 11 mg/dL (ref 8–27)
Bilirubin Total: 0.4 mg/dL (ref 0.0–1.2)
CHLORIDE: 103 mmol/L (ref 96–106)
CO2: 25 mmol/L (ref 18–29)
Calcium: 8.9 mg/dL (ref 8.7–10.3)
Creatinine, Ser: 0.69 mg/dL (ref 0.57–1.00)
GFR calc Af Amer: 106 mL/min/{1.73_m2} (ref >59)
GFR calc non Af Amer: 92 mL/min/{1.73_m2} (ref >59)
GLOBULIN, TOTAL: 2.3 g/dL (ref 1.5–4.5)
GLUCOSE: 98 mg/dL (ref 65–99)
Potassium: 3.8 mmol/L (ref 3.5–5.2)
SODIUM: 145 mmol/L — AB (ref 134–144)
Total Protein: 6.6 g/dL (ref 6.0–8.5)

## 2016-04-09 LAB — TSH: TSH: 0.721 u[IU]/mL (ref 0.450–4.500)

## 2016-05-28 ENCOUNTER — Telehealth: Payer: Self-pay | Admitting: Nurse Practitioner

## 2016-05-28 NOTE — Telephone Encounter (Signed)
Patient called to confirm she takes xarelto 20 mg po daily

## 2016-05-28 NOTE — Telephone Encounter (Signed)
Returned call to patient. She verbalized understanding that she is taking Xarelto 20 mg once a day. She needed to confirm this information to give to her back doctor.

## 2016-06-09 ENCOUNTER — Encounter: Payer: Self-pay | Admitting: Psychiatry

## 2016-06-09 ENCOUNTER — Ambulatory Visit (INDEPENDENT_AMBULATORY_CARE_PROVIDER_SITE_OTHER): Payer: BLUE CROSS/BLUE SHIELD | Admitting: Psychiatry

## 2016-06-09 VITALS — BP 115/72 | HR 87 | Temp 98.1°F | Wt 208.4 lb

## 2016-06-09 DIAGNOSIS — F411 Generalized anxiety disorder: Secondary | ICD-10-CM

## 2016-06-09 DIAGNOSIS — F331 Major depressive disorder, recurrent, moderate: Secondary | ICD-10-CM | POA: Diagnosis not present

## 2016-06-09 MED ORDER — DULOXETINE HCL 60 MG PO CPEP: 60.0000 mg | ORAL_CAPSULE | Freq: Every day | ORAL | 1 refills | Status: DC

## 2016-06-09 MED ORDER — CLONAZEPAM 0.5 MG PO TABS: 0.5000 mg | ORAL_TABLET | Freq: Two times a day (BID) | ORAL | 1 refills | Status: DC

## 2016-06-09 MED ORDER — ZOLPIDEM TARTRATE 10 MG PO TABS: 10.0000 mg | ORAL_TABLET | Freq: Every evening | ORAL | 1 refills | Status: DC | PRN

## 2016-06-09 NOTE — Progress Notes (Signed)
Follow-up for 66 year old woman with a history of anxiety and depression. Patient's pain is under better control although she still has chronic issues in her neck. Mood has been pretty good. No major mood swings. No return of severe depression. She is having some difficulty sleeping since being taken off of her Ambien and is requesting that that be readdressed. No evidence of overuse of medicine. Mood is stable.  Neatly dressed and groomed. Alert and oriented. No suicidal or homicidal ideation. Appropriate and reactive affect. Clear thinking. Short and long-term memory intact. Good judgment and insight.  Patient appears to be stable and tolerating medicine. Renew Klonopin a half milligram twice a day, Cymbalta 60 mg a day and also agreed to restart Ambien 10 mg at night as needed for sleep. Orders will be placed to her mail order pharmacy for her controlled substances so that she can get those without having to go to [facility] every month. Patient will be seen again for follow-up in 6 months call sooner if needed.

## 2016-07-08 ENCOUNTER — Ambulatory Visit: Payer: BLUE CROSS/BLUE SHIELD | Admitting: Psychiatry

## 2016-07-09 NOTE — Progress Notes (Signed)
Cardiology Office Note  Date:  07/10/2016   ID:  Sonya [name], DOB 05-23-1950, MRN [id number]  PCP:  Wardell Honour, MD   Chief Complaint  Patient presents with  . other    6 month follow up. Meds reviewed by the pt. verbally. "doing well."     HPI:   Sonya [name] is a 66 year old woman with a long history of  Paroxysmal atrial fibrillation in the setting of narcotic overdose  09/12/2015 smoking who stopped 4 years ago, chronic back pain with Harrington rods placed, placed 1980 spinal stimulator for pain anemia with iron deficiency,  emphysema seen on CT scan,  pneumonia x2 in May and August 2011,  symptoms of swelling in her legs, total knee replacements bilaterally in 2014,  workup with echocardiogram and stress test, previous symptoms of fatigue,  shortness of breath and edema.  H/o depression Previous stress test 04/2013 She presents today for followup after episodes of chest pain.  Rare episodes of atrial fib, Lasts <45 min She does not want to change her medications 1 episode came on   when she was having trouble with GERD  Weight going up Try to lose  Rare heartburn, last afternoon  Previously [place], broke rod in her back Saw orthopedics, did not want to fix it (too many complications )  Admission to hospital for narcoic overdose: 09/10/2015 Found by husband CXR with large right side infiultrate Given lasix, ABX, aspiated,  Diagnosed with atrial fibrillation 09/12/2015 NSR on 10/02/2015 On xarelto   Echo 09/11/2015: EF 50 to 55% RVSP 40  Mg  Previous labs reviewed personally by myself and with the patient on todays visit Total 155, LDL 88 HBA1C 5.5  EKG  personally reviewed by myself showing  normal sinus rhythm with rate of 85 beats per minute, no significant ST-T wave changes   stress test September 26 2009 showed no ischemia, poor exercise tolerance for her age, ejection fraction 71%   Echocardiogram done June 2011 shows normal systolic function,  mild MR, normal right systolic function and pressure     PMH:   has a past medical history of Allergic rhinitis, cause unspecified; Anemia; Anxiety; Arthritis; Benign neoplasm of colon; Bruising; Chest pain; Chronic pain syndrome; Degeneration of lumbar or lumbosacral intervertebral disc; Depressive disorder, not elsewhere classified; Edema; Emphysema; GERD (gastroesophageal reflux disease); Hematuria, unspecified; Hiatal hernia; HLD (hyperlipidemia); Hyperglycemia; Insomnia, unspecified; Lumbosacral spondylosis without myelopathy; Neurotic excoriations; Osteoporosis, unspecified; PAF (paroxysmal atrial fibrillation) ([place]); Personal history of urinary calculi; Pneumonia; Prurigo nodularis; Pulmonary hypertension ([place]); Pure hypercholesterolemia; Scoliosis (and kyphoscoliosis), idiopathic; Squamous cell carcinoma; Tobacco use disorder; Unspecified hypothyroidism; Unspecified vitamin D deficiency; and UTI (urinary tract infection).  PSH:    Past Surgical History:  Procedure Laterality Date  . 2 harrington rods in back  1980  . ABDOMINAL HYSTERECTOMY     endometriosis; ovaries resected; no cancer.  Lillard Anes  2006   both feet  . CATARACT EXTRACTION, BILATERAL    . [place]  . EYE SURGERY    . JOINT REPLACEMENT  03/16/12   L TKR; R TKR  . lumbar spine surgery  [place]  . squamous cell carcinoma resection  02/2010   [name]  . TONSILLECTOMY AND ADENOIDECTOMY  1964  . VAGINAL HYSTERECTOMY  1990    Current Outpatient Prescriptions  Medication Sig Dispense Refill  . AMITIZA 24 MCG capsule TAKE 1 CAPSULE BY MOUTH TWICE DAILY AS NEEDED  2  . baclofen (LIORESAL)  10 MG tablet Take 0.5 tablets (5 mg total) by mouth 2 (two) times daily. 60 tablet 0  . budesonide (PULMICORT FLEXHALER) 180 MCG/ACT inhaler Inhale 1 puff into the lungs 2 (two) times daily.    . clonazePAM (KLONOPIN) 0.5 MG tablet Take 1 tablet (0.5 mg total) by mouth 2 (two) times daily.  180 tablet 1  . diltiazem (CARDIZEM CD) 240 MG 24 hr capsule Take 1 capsule (240 mg total) by mouth daily. 90 capsule 3  . DULoxetine (CYMBALTA) 60 MG capsule Take 1 capsule (60 mg total) by mouth daily. 90 capsule 1  . fexofenadine (ALLEGRA) 180 MG tablet Take 1 tablet (180 mg total) by mouth daily. 90 tablet 3  . levalbuterol (XOPENEX HFA) 45 MCG/ACT inhaler Inhale 2 puffs into the lungs every 6 (six) hours as needed for wheezing or shortness of breath. 1 Inhaler 12  . levothyroxine (SYNTHROID) 50 MCG tablet Take 1 tablet (50 mcg total) by mouth daily before breakfast. 90 tablet 3  . montelukast (SINGULAIR) 10 MG tablet Take 10 mg by mouth at bedtime.  11  . OXYGEN Inhale 2 L into the lungs at bedtime.    . pantoprazole (PROTONIX) 40 MG tablet Take 1 tablet (40 mg total) by mouth daily at 12 noon. 90 tablet 3  . pregabalin (LYRICA) 50 MG capsule Take 50 mg by mouth 2 (two) times daily.    . rivaroxaban (XARELTO) 20 MG TABS tablet Take 1 tablet (20 mg total) by mouth daily with supper. 90 tablet 3  . rosuvastatin (CRESTOR) 20 MG tablet Take 1 tablet (20 mg total) by mouth daily. 90 tablet 3  . traMADol (ULTRAM) 50 MG tablet Take 100 mg by mouth every 6 (six) hours as needed.    . tretinoin (RETIN-A) 0.1 % cream Apply a pea sized amount to the face nightly 20 min after washing, avoiding area around the eyes and creases around the nose.    . zolpidem (AMBIEN) 10 MG tablet Take 1 tablet (10 mg total) by mouth at bedtime as needed for sleep. 90 tablet 1   No current facility-administered medications for this visit.      Allergies:   Latex; Levofloxacin; and Sulfonamide derivatives   Social History:  The patient  reports that she quit smoking about 9 years ago. Her smoking use included Cigarettes. She started smoking about 31 years ago. She has a 25.00 pack-year smoking history. She has never used smokeless tobacco. She reports that she does not drink alcohol or use drugs.   Family History:    family history includes Breast cancer in her cousin; Cancer in her maternal grandmother; Cancer (age of onset: 14) in her father; Coronary artery disease in her brother; Heart disease in her brother, brother, brother, brother, father, and mother; Heart disease (age of onset: 24) in her sister; Heart failure in her brother, father, and mother; Hypertension in her mother.    Review of Systems: Review of Systems  Constitutional: Positive for malaise/fatigue.  Respiratory: Negative.   Cardiovascular: Negative.   Gastrointestinal: Negative.   Musculoskeletal: Negative.   Neurological: Positive for weakness.  Psychiatric/Behavioral: Negative.   All other systems reviewed and are negative.    PHYSICAL EXAM: VS:  BP 100/60 (BP Location: Left Arm, Patient Position: Sitting, Cuff Size: Normal)   Pulse 85   Ht 5' 1.5" (1.562 m)   Wt 206 lb 4 oz (93.6 kg)   BMI 38.34 kg/m  , BMI Body mass index is 38.34 kg/m.  GEN:  Well nourished, well developed, in no acute distress, obese  HEENT: normal  Neck: no JVD, carotid bruits, or masses Cardiac: RRR; no murmurs, rubs, or gallops,no edema  Respiratory:  clear to auscultation bilaterally, normal work of breathing GI: soft, nontender, nondistended, + BS MS: no deformity or atrophy  Skin: warm and dry, no rash Neuro:  Strength and sensation are intact Psych: euthymic mood, full affect    Recent Labs: 09/09/2015: B Natriuretic Peptide 462.1 09/13/2015: Magnesium 1.9 01/02/2016: Hemoglobin 11.5 04/08/2016: ALT 16; BUN 11; Creatinine, Ser 0.69; Platelets 293; Potassium 3.8; Sodium 145; TSH 0.721    Lipid Panel Lab Results  Component Value Date   CHOL 155 01/02/2016   HDL 46 (L) 01/02/2016   LDLCALC 88 01/02/2016   TRIG 106 01/02/2016      Wt Readings from Last 3 Encounters:  07/10/16 206 lb 4 oz (93.6 kg)  04/08/16 207 lb 12.8 oz (94.3 kg)  01/11/16 201 lb (91.2 kg)       ASSESSMENT AND PLAN:   Paroxysmal atrial fibrillation (HCC)  - Plan: EKG 12-Lead Maintaining normal sinus rhythm Tolerating anticoagulation and Cardizem No medication changes made Recommended she call us for palpitations concerning for arrhythmia  Pure hypercholesterolemia Cholesterol is at goal on the current lipid regimen. No changes to the medications were made.  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 Dietary guide provided   Total encounter time more than 25 minutes  Greater than 50% was spent in counseling and coordination of care with the patient   Disposition:   F/U  12 months   No orders of the defined types were placed in this encounter.    Signed, Esmond Plants, M.D., Ph.D. 07/10/2016  [place], [place]

## 2016-07-10 ENCOUNTER — Ambulatory Visit (INDEPENDENT_AMBULATORY_CARE_PROVIDER_SITE_OTHER): Payer: BLUE CROSS/BLUE SHIELD | Admitting: Cardiovascular Disease

## 2016-07-10 ENCOUNTER — Encounter: Payer: Self-pay | Admitting: Cardiovascular Disease

## 2016-07-10 VITALS — BP 100/60 | HR 85 | Ht 61.5 in | Wt 206.2 lb

## 2016-07-10 DIAGNOSIS — I480 Paroxysmal atrial fibrillation: Secondary | ICD-10-CM

## 2016-07-10 DIAGNOSIS — E7800 Pure hypercholesterolemia, unspecified: Secondary | ICD-10-CM | POA: Diagnosis not present

## 2016-07-10 DIAGNOSIS — J449 Chronic obstructive pulmonary disease, unspecified: Secondary | ICD-10-CM

## 2016-07-10 MED ORDER — DILTIAZEM HCL ER COATED BEADS 240 MG PO CP24: 240.0000 mg | ORAL_CAPSULE | Freq: Every day | ORAL | 3 refills | Status: AC

## 2016-07-10 MED ORDER — RIVAROXABAN 20 MG PO TABS: 20.0000 mg | ORAL_TABLET | Freq: Every day | ORAL | 3 refills | Status: AC

## 2016-07-10 NOTE — Patient Instructions (Signed)

## 2016-07-30 ENCOUNTER — Encounter: Payer: Self-pay | Admitting: Family Medicine

## 2016-07-30 ENCOUNTER — Ambulatory Visit (INDEPENDENT_AMBULATORY_CARE_PROVIDER_SITE_OTHER): Payer: BLUE CROSS/BLUE SHIELD | Admitting: Family Medicine

## 2016-07-30 ENCOUNTER — Ambulatory Visit (INDEPENDENT_AMBULATORY_CARE_PROVIDER_SITE_OTHER): Payer: BLUE CROSS/BLUE SHIELD

## 2016-07-30 VITALS — BP 126/82 | HR 86 | Temp 97.9°F | Resp 18 | Ht 61.81 in | Wt 200.0 lb

## 2016-07-30 DIAGNOSIS — I878 Other specified disorders of veins: Secondary | ICD-10-CM

## 2016-07-30 DIAGNOSIS — J4541 Moderate persistent asthma with (acute) exacerbation: Secondary | ICD-10-CM

## 2016-07-30 DIAGNOSIS — Z23 Encounter for immunization: Secondary | ICD-10-CM

## 2016-07-30 DIAGNOSIS — E7800 Pure hypercholesterolemia, unspecified: Secondary | ICD-10-CM

## 2016-07-30 DIAGNOSIS — K219 Gastro-esophageal reflux disease without esophagitis: Secondary | ICD-10-CM | POA: Diagnosis not present

## 2016-07-30 DIAGNOSIS — G894 Chronic pain syndrome: Secondary | ICD-10-CM

## 2016-07-30 DIAGNOSIS — I480 Paroxysmal atrial fibrillation: Secondary | ICD-10-CM | POA: Diagnosis not present

## 2016-07-30 DIAGNOSIS — J301 Allergic rhinitis due to pollen: Secondary | ICD-10-CM | POA: Diagnosis not present

## 2016-07-30 DIAGNOSIS — R7302 Impaired glucose tolerance (oral): Secondary | ICD-10-CM

## 2016-07-30 DIAGNOSIS — W57XXXA Bitten or stung by nonvenomous insect and other nonvenomous arthropods, initial encounter: Secondary | ICD-10-CM

## 2016-07-30 DIAGNOSIS — R059 Cough, unspecified: Secondary | ICD-10-CM

## 2016-07-30 DIAGNOSIS — L981 Factitial dermatitis: Secondary | ICD-10-CM

## 2016-07-30 DIAGNOSIS — G9331 Postviral fatigue syndrome: Secondary | ICD-10-CM

## 2016-07-30 DIAGNOSIS — R05 Cough: Secondary | ICD-10-CM | POA: Diagnosis not present

## 2016-07-30 DIAGNOSIS — G933 Postviral fatigue syndrome: Secondary | ICD-10-CM

## 2016-07-30 DIAGNOSIS — E034 Atrophy of thyroid (acquired): Secondary | ICD-10-CM

## 2016-07-30 MED ORDER — TRIAMCINOLONE ACETONIDE 0.1 % EX CREA: 1.0000 "application " | TOPICAL_CREAM | Freq: Two times a day (BID) | CUTANEOUS | 0 refills | Status: DC

## 2016-07-30 MED ORDER — FLUTICASONE PROPIONATE 50 MCG/ACT NA SUSP: 2.0000 | Freq: Every day | NASAL | 3 refills | Status: AC

## 2016-07-30 MED ORDER — AZITHROMYCIN 250 MG PO TABS: ORAL_TABLET | ORAL | 0 refills | Status: DC

## 2016-07-30 MED ORDER — LEVOTHYROXINE SODIUM 50 MCG PO TABS: 50.0000 ug | ORAL_TABLET | Freq: Every day | ORAL | 3 refills | Status: AC

## 2016-07-30 MED ORDER — PANTOPRAZOLE SODIUM 40 MG PO TBEC: 40.0000 mg | DELAYED_RELEASE_TABLET | Freq: Every day | ORAL | 3 refills | Status: AC

## 2016-07-30 MED ORDER — ROSUVASTATIN CALCIUM 20 MG PO TABS: 20.0000 mg | ORAL_TABLET | Freq: Every day | ORAL | 3 refills | Status: AC

## 2016-07-30 MED ORDER — ALBUTEROL SULFATE HFA 108 (90 BASE) MCG/ACT IN AERS: 2.0000 | INHALATION_SPRAY | Freq: Four times a day (QID) | RESPIRATORY_TRACT | 1 refills | Status: AC | PRN

## 2016-07-30 MED ORDER — ZOSTER VAC RECOMB ADJUVANTED 50 MCG/0.5ML IM SUSR: 0.5000 mL | Freq: Once | INTRAMUSCULAR | 1 refills | Status: AC

## 2016-07-30 NOTE — Progress Notes (Signed)
Subjective:    Patient ID: Sonya [name], female    DOB: 01-Dec-1950, 66 y.o.   MRN: [id number]  07/30/2016  Follow-up (4 months) and Medication Refill (Crestor,Protonix,Synthroid, Inhaler )   HPI This 66 y.o. female presents for evaluation of hypercholesterolemia, hypothyroidism, venous stasis, GERD,osteopenia.  Patient reports good compliance with medication, good tolerance to medication, and good symptom control.    Cough with congestion: onset three weeks ago; having wheezing and SOB.  No fever/chills/sweats.  No significant nasal congestion, rhinorrhea; no sore throat or ear pain.  Taking Xopenex nebulizer bid at most.  Did not like QVAR and not taking it.  Taking allergy medications.    Chronic pain syndrome: has chosen to no longer take any opiates; taking only Tramadol and in a lot of pain throughout the day.   Skin lesions: continues phototherapy with dermatology twice weekly.  Skin doing better.  Anxiety and depression: worries about great grandchildren a lot.  Granddaughter is single with three small children and unable to afford them.    Review of Systems  Constitutional: Negative for chills, diaphoresis, fatigue and fever.  HENT: Positive for postnasal drip. Negative for congestion, ear pain, rhinorrhea, sinus pain, sinus pressure, sneezing, sore throat, trouble swallowing and voice change.   Eyes: Negative for visual disturbance.  Respiratory: Positive for cough, shortness of breath and wheezing.   Cardiovascular: Negative for chest pain, palpitations and leg swelling.  Gastrointestinal: Negative for abdominal pain, constipation, diarrhea, nausea and vomiting.  Endocrine: Negative for cold intolerance, heat intolerance, polydipsia, polyphagia and polyuria.  Musculoskeletal: Positive for back pain and myalgias.  Neurological: Negative for dizziness, tremors, seizures, syncope, facial asymmetry, speech difficulty, weakness, light-headedness, numbness and headaches.    Psychiatric/Behavioral: Negative for dysphoric mood. The patient is nervous/anxious.     Past Medical History:  Diagnosis Date  . Allergic rhinitis, cause unspecified   . Anemia   . Anxiety   . Arthritis   . Benign neoplasm of colon   . Bruising   . Chest pain    a. 2015 Neg MV.  . Chronic pain syndrome   . Degeneration of lumbar or lumbosacral intervertebral disc   . Depressive disorder, not elsewhere classified   . Edema   . Emphysema   . GERD (gastroesophageal reflux disease)   . Hematuria, unspecified   . Hiatal hernia   . HLD (hyperlipidemia)   . Hyperglycemia   . Insomnia, unspecified   . Lumbosacral spondylosis without myelopathy   . Neurotic excoriations   . Osteoporosis, unspecified   . PAF (paroxysmal atrial fibrillation) ([place])    a. 08/2015 in setting of asp pna-->converted on dilt. Xarelto started (CHA2DS2VASc= 1).  . Personal history of urinary calculi   . Pneumonia   . Prurigo nodularis   . Pulmonary hypertension ([place])    a. 08/2015 Echo: EF 50-55%, no rwma, gr1 DD, mild MR, PASP 55mmHg.   Marland Kitchen Pure hypercholesterolemia   . Scoliosis (and kyphoscoliosis), idiopathic   . Squamous cell carcinoma   . Tobacco use disorder   . Unspecified hypothyroidism   . Unspecified vitamin D deficiency   . UTI (urinary tract infection)    frequent   Past Surgical History:  Procedure Laterality Date  . 2 harrington rods in back  1980  . ABDOMINAL HYSTERECTOMY     endometriosis; ovaries resected; no cancer.  Lillard Anes  2006   both feet  . CATARACT EXTRACTION, BILATERAL    . [place]  .  EYE SURGERY    . JOINT REPLACEMENT  03/16/12   L TKR; R TKR  . lumbar spine surgery  [place]  . squamous cell carcinoma resection  02/2010   [name]  . TONSILLECTOMY AND ADENOIDECTOMY  1964  . VAGINAL HYSTERECTOMY  1990   Allergies  Allergen Reactions  . Latex Other (See Comments)    OPEN SORES  . Levofloxacin   . Sulfonamide  Derivatives     Social History   Social History  . Marital status: Married    Spouse name: Diondra Pines  . Number of children: 1  . Years of education: 16   Occupational History  . disabled     59   DDD   Social History Main Topics  . Smoking status: Former Smoker    Packs/day: 1.00    Years: 25.00    Types: Cigarettes    Start date: 11/05/1984    Quit date: 11/06/2006  . Smokeless tobacco: Never Used     Comment: quit 11/07  . Alcohol use No  . Drug use: No  . Sexual activity: Yes    Partners: Male   Other Topics Concern  . Not on file   Social History Narrative   Marital status:  Married since 1992; second marriage; happily married; no abuse.      Children:  1 daughter; 3 stepchildren; 8 grandchildren; 2 gg.   Daughter in [place].  Husband's children in [place].      Employment:  Retired/disabled due to DDD lumbar.       Lives:   Lives with spouse;      Caffeine use cosumes a moderate amount 1 serving per day.      Always uses seat belts.       No guns in home.       Smoke alarm and carbon monoxide detectors in the home.       No Living Will.   Family History  Problem Relation Age of Onset  . Coronary artery disease Brother        stents    SIster stents also  . Heart disease Brother   . Heart failure Brother        CABG  AMI @ 75  . Heart disease Brother   . Heart failure Mother        pacemaker  . Hypertension Mother   . Heart disease Mother        CHF; pacemaker  . Heart failure Father        AMI  . Cancer Father 9       colon cancer  . Heart disease Father        mutliple AMIs  . Cancer Maternal Grandmother        ovarian  . Heart disease Sister 82       cardiac stenting  . Heart disease Brother   . Heart disease Brother   . GI problems Unknown        Family history malignancy, GI tract  . Breast cancer Cousin        Objective:    BP 126/82   Pulse 86   Temp 97.9 F (36.6 C) (Oral)   Resp 18   Ht 5' 1.81" (1.57 m)   Wt 200 lb (90.7  kg)   SpO2 94%   BMI 36.80 kg/m  Physical Exam  Constitutional: She is oriented to person, place, and time. She appears well-developed and well-nourished. No distress.  HENT:  Head: Normocephalic and atraumatic.  Right Ear: External ear normal.  Left Ear: External ear normal.  Nose: Nose normal.  Mouth/Throat: Oropharynx is clear and moist.  Eyes: Conjunctivae and EOM are normal. Pupils are equal, round, and reactive to light.  Neck: Normal range of motion. Neck supple. Carotid bruit is not present. No thyromegaly present.  Cardiovascular: Normal rate, regular rhythm, normal heart sounds and intact distal pulses.  Exam reveals no gallop and no friction rub.   No murmur heard. Pulmonary/Chest: Effort normal and breath sounds normal. She has no wheezes. She has no rales.  Abdominal: Soft. Bowel sounds are normal. She exhibits no distension and no mass. There is no tenderness. There is no rebound and no guarding.  Lymphadenopathy:    She has no cervical adenopathy.  Neurological: She is alert and oriented to person, place, and time. No cranial nerve deficit.  Skin: Skin is warm and dry. Rash noted. She is not diaphoretic. There is erythema. No pallor.  Healing excoriations on forearms and legs; much improved from last visit. +erythematous rash lateral neck B.   Psychiatric: She has a normal mood and affect. Her behavior is normal.   Depression screen [facility] 2/9 07/30/2016 04/08/2016 01/02/2016 10/02/2015 12/01/2014  Decreased Interest 0 0 0 0 0  Down, Depressed, Hopeless 0 0 0 0 0  PHQ - 2 Score 0 0 0 0 0        Assessment & Plan:   1. Cough   2. Seasonal allergic rhinitis due to pollen   3. Paroxysmal atrial fibrillation (HCC)   4. Gastroesophageal reflux disease without esophagitis   5. Glucose intolerance (impaired glucose tolerance)   6. Hypothyroidism due to acquired atrophy of thyroid   7. Neurotic excoriations   8. Chronic pain syndrome   9. Pure hypercholesterolemia   10.  Venous stasis   11. Postviral fatigue syndrome   12. Insect bite, initial encounter   37. Need for shingles vaccine   14. Moderate persistent asthma with acute exacerbation    -new onset cough with congestion with COPD exacerbation; obtain CXR; treat with Zpack; rx for Albuterol HFA provided. If no improvement in 72 hours, call for Prednisone taper.  Start Mucinex DM. -obtain labs for chronic disease management.  -refills provided. -seeing dermatology for neurotic excoriations; responding nicely to phototherapy. -suffering with chronic lower back pain yet has decreased pain control to Tramadol only.  Followed by pain clinic. -suffering with profound fatigue for the past month yet has had acute illness; obtain labs; if fatigue persists, obtain repeat sleep study as patient has gained a lot of weight in the past few years.  -New insect bites; rx for triamcinolone cream for bug bites on neck.  Orders Placed This Encounter  Procedures  . DG Chest 2 View    Standing Status:   Future    Number of Occurrences:   1    Standing Expiration Date:   07/30/2017    Order Specific Question:   Reason for Exam (SYMPTOM  OR DIAGNOSIS REQUIRED)    Answer:   cough, congestion, SOB; history of COPD    Order Specific Question:   Preferred imaging location?    Answer:   External  . CBC with Differential/Platelet  . Comprehensive metabolic panel    Order Specific Question:   Has the patient fasted?    Answer:   Yes  . Hemoglobin A1c  . Lipid panel    Order Specific Question:   Has the patient fasted?  Answer:   Yes  . TSH  . T4, free  . Vitamin B12  . VITAMIN D 25 Hydroxy (Vit-D Deficiency, Fractures)   Meds ordered this encounter  Medications  . albuterol (PROVENTIL HFA;VENTOLIN HFA) 108 (90 Base) MCG/ACT inhaler    Sig: Inhale 2 puffs into the lungs every 6 (six) hours as needed.    Dispense:  3 Inhaler    Refill:  1    PLEASE DISPENSE HFA THAT INSURANCE PREFERS.  Marland Kitchen levothyroxine (SYNTHROID) 50  MCG tablet    Sig: Take 1 tablet (50 mcg total) by mouth daily before breakfast.    Dispense:  90 tablet    Refill:  3  . pantoprazole (PROTONIX) 40 MG tablet    Sig: Take 1 tablet (40 mg total) by mouth daily at 12 noon.    Dispense:  90 tablet    Refill:  3  . rosuvastatin (CRESTOR) 20 MG tablet    Sig: Take 1 tablet (20 mg total) by mouth daily.    Dispense:  90 tablet    Refill:  3  . triamcinolone cream (KENALOG) 0.1 %    Sig: Apply 1 application topically 2 (two) times daily.    Dispense:  30 g    Refill:  0  . fluticasone (FLONASE) 50 MCG/ACT nasal spray    Sig: Place 2 sprays into both nostrils daily.    Dispense:  48 g    Refill:  3  . azithromycin (ZITHROMAX) 250 MG tablet    Sig: Take 2 tabs PO x 1 dose, then 1 tab PO QD x 4 days    Dispense:  6 tablet    Refill:  0  . Zoster Vac Recomb Adjuvanted (SHINGRIX) injection    Sig: Inject 0.5 mLs into the muscle once.    Dispense:  0.5 mL    Refill:  1    Return in about 4 months (around 11/29/2016) for recheck .   Lafe Clerk Elayne Guerin, M.D. Primary Care at [facility] [place] [address] [place], [place]  [address] [phone] phone [phone] fax

## 2016-07-30 NOTE — Patient Instructions (Signed)
     IF you received an x-ray today, you will receive an invoice from [facility]. Please contact [facility] at [phone] with questions or concerns regarding your invoice.   IF you received labwork today, you will receive an invoice from [facility]. Please contact [facility] at [phone] with questions or concerns regarding your invoice.   Our billing staff will not be able to assist you with questions regarding bills from these companies.  You will be contacted with the lab results as soon as they are available. The fastest way to get your results is to activate your My Chart account. Instructions are located on the last page of this paperwork. If you have not heard from us regarding the results in 2 weeks, please contact this office.     

## 2016-07-31 DIAGNOSIS — J4541 Moderate persistent asthma with (acute) exacerbation: Secondary | ICD-10-CM | POA: Insufficient documentation

## 2016-07-31 LAB — CBC WITH DIFFERENTIAL/PLATELET
Basophils Absolute: 0 10*3/uL (ref 0.0–0.2)
Basos: 0 %
EOS (ABSOLUTE): 0 10*3/uL (ref 0.0–0.4)
Eos: 0 %
Hematocrit: 36 % (ref 34.0–46.6)
Hemoglobin: 10.4 g/dL — ABNORMAL LOW (ref 11.1–15.9)
IMMATURE GRANULOCYTES: 0 %
Immature Grans (Abs): 0 10*3/uL (ref 0.0–0.1)
Lymphocytes Absolute: 1.7 10*3/uL (ref 0.7–3.1)
Lymphs: 21 %
MCH: 21.2 pg — ABNORMAL LOW (ref 26.6–33.0)
MCHC: 28.9 g/dL — ABNORMAL LOW (ref 31.5–35.7)
MCV: 73 fL — ABNORMAL LOW (ref 79–97)
MONOS ABS: 0.8 10*3/uL (ref 0.1–0.9)
Monocytes: 9 %
NEUTROS PCT: 70 %
Neutrophils Absolute: 5.6 10*3/uL (ref 1.4–7.0)
PLATELETS: 330 10*3/uL (ref 150–379)
RBC: 4.91 x10E6/uL (ref 3.77–5.28)
RDW: 17.2 % — AB (ref 12.3–15.4)
WBC: 8.1 10*3/uL (ref 3.4–10.8)

## 2016-07-31 LAB — COMPREHENSIVE METABOLIC PANEL
ALT: 12 IU/L (ref 0–32)
AST: 17 IU/L (ref 0–40)
Albumin/Globulin Ratio: 1.5 (ref 1.2–2.2)
Albumin: 4.2 g/dL (ref 3.6–4.8)
Alkaline Phosphatase: 100 IU/L (ref 39–117)
BUN/Creatinine Ratio: 13 (ref 12–28)
BUN: 11 mg/dL (ref 8–27)
Bilirubin Total: 0.5 mg/dL (ref 0.0–1.2)
CALCIUM: 9.1 mg/dL (ref 8.7–10.3)
CO2: 21 mmol/L (ref 18–29)
CREATININE: 0.83 mg/dL (ref 0.57–1.00)
Chloride: 103 mmol/L (ref 96–106)
GFR calc Af Amer: 86 mL/min/{1.73_m2} (ref >59)
GFR, EST NON AFRICAN AMERICAN: 75 mL/min/{1.73_m2} (ref >59)
GLUCOSE: 77 mg/dL (ref 65–99)
Globulin, Total: 2.8 g/dL (ref 1.5–4.5)
POTASSIUM: 4.3 mmol/L (ref 3.5–5.2)
Sodium: 143 mmol/L (ref 134–144)
Total Protein: 7 g/dL (ref 6.0–8.5)

## 2016-07-31 LAB — LIPID PANEL
CHOLESTEROL TOTAL: 141 mg/dL (ref 100–199)
Chol/HDL Ratio: 3.5 ratio (ref 0.0–4.4)
HDL: 40 mg/dL (ref >39)
LDL CALC: 79 mg/dL (ref 0–99)
Triglycerides: 111 mg/dL (ref 0–149)
VLDL CHOLESTEROL CAL: 22 mg/dL (ref 5–40)

## 2016-07-31 LAB — VITAMIN D 25 HYDROXY (VIT D DEFICIENCY, FRACTURES): Vit D, 25-Hydroxy: 36.9 ng/mL (ref 30.0–100.0)

## 2016-07-31 LAB — HEMOGLOBIN A1C
Est. average glucose Bld gHb Est-mCnc: 120 mg/dL
Hgb A1c MFr Bld: 5.8 % — ABNORMAL HIGH (ref 4.8–5.6)

## 2016-07-31 LAB — T4, FREE: FREE T4: 1.41 ng/dL (ref 0.82–1.77)

## 2016-07-31 LAB — TSH: TSH: 2.16 u[IU]/mL (ref 0.450–4.500)

## 2016-07-31 LAB — VITAMIN B12: Vitamin B-12: 312 pg/mL (ref 232–1245)

## 2016-10-02 ENCOUNTER — Other Ambulatory Visit: Payer: Self-pay | Admitting: Pain Medicine

## 2016-10-02 DIAGNOSIS — M545 Low back pain: Secondary | ICD-10-CM

## 2016-10-03 ENCOUNTER — Ambulatory Visit
Admission: RE | Admit: 2016-10-03 | Discharge: 2016-10-03 | Disposition: A | Discharge status: Home / Self Care | Payer: BLUE CROSS/BLUE SHIELD | Source: Ambulatory Visit | Attending: Pain Medicine | Admitting: Pain Medicine

## 2016-10-03 DIAGNOSIS — M545 Low back pain: Secondary | ICD-10-CM

## 2016-10-13 ENCOUNTER — Other Ambulatory Visit: Payer: Self-pay | Admitting: Pain Medicine

## 2016-10-13 DIAGNOSIS — M545 Low back pain, unspecified: Secondary | ICD-10-CM

## 2016-11-11 ENCOUNTER — Encounter: Payer: Self-pay | Admitting: Family Medicine

## 2016-11-11 ENCOUNTER — Telehealth: Payer: Self-pay | Admitting: Cardiovascular Disease

## 2016-11-11 DIAGNOSIS — K5909 Other constipation: Secondary | ICD-10-CM | POA: Insufficient documentation

## 2016-11-11 DIAGNOSIS — Z86010 Personal history of colonic polyps: Secondary | ICD-10-CM | POA: Insufficient documentation

## 2016-11-11 DIAGNOSIS — R195 Other fecal abnormalities: Secondary | ICD-10-CM | POA: Insufficient documentation

## 2016-11-11 DIAGNOSIS — Z860101 Personal history of adenomatous and serrated colon polyps: Secondary | ICD-10-CM | POA: Insufficient documentation

## 2016-11-11 DIAGNOSIS — Z7901 Long term (current) use of anticoagulants: Secondary | ICD-10-CM | POA: Insufficient documentation

## 2016-11-11 DIAGNOSIS — K625 Hemorrhage of anus and rectum: Secondary | ICD-10-CM | POA: Insufficient documentation

## 2016-11-11 NOTE — Telephone Encounter (Signed)
Received request for Cardiac Clearance from [facility] GI Department for patient to have upcoming colonoscopy and/or upper endoscopy on 11/21/16. They would also like instructions for holding Xarelto for her procedure. Please route clearance to Fax [phone] and if there are any questions please call [phone].

## 2016-11-11 NOTE — Telephone Encounter (Signed)
Acceptable risk for surgery No further testing needed Hold xarelto 2 days prior to procedure

## 2016-11-12 ENCOUNTER — Telehealth: Payer: Self-pay | Admitting: Cardiovascular Disease

## 2016-11-12 NOTE — Telephone Encounter (Signed)
Pt is on Xarelto and hemaglobin has dropped 4 points. Pt is having procedure on 9/28 and needs clearance. Please call.

## 2016-11-12 NOTE — Telephone Encounter (Signed)
Clearance addressed in telephone note dated 11/11/16. Telephone encounter from 11/11/16 routed via [facility] fax to number provided.

## 2016-11-12 NOTE — Telephone Encounter (Signed)
Clearance routed to number provided.  

## 2016-11-13 NOTE — Telephone Encounter (Signed)
Routing to Dr Gollan for advice. 

## 2016-11-13 NOTE — Telephone Encounter (Signed)
Robin from [facility] calling wanting to let Dr Rockey Situ know of the 4 points down in Hemoglobin  For he may have a different opinion on clearance.  Would like call back per Dr Tiffany Kocher at [facility] gastro

## 2016-11-14 NOTE — Telephone Encounter (Signed)
Hemoglobin from 11/11/16 is now 9.1 down from 10.4 on 07/30/16.

## 2016-11-14 NOTE — Telephone Encounter (Signed)
Spoke with Maudie Mercury NP at [facility] GI regarding patients hemoglobin drifting downward. She states that Dr. Vira Agar would like to hold the Xarelto for 5 days prior if possible. Let her know that should be fine but that I would route message to Dr. Rockey Situ for his review.

## 2016-11-15 NOTE — Telephone Encounter (Signed)
Only needs to hold xarelto 2 days before procedure

## 2016-11-17 NOTE — Telephone Encounter (Signed)
S/w Robin at [facility]. She is aware that Dr. Rockey Situ recommends holding xarelto 2 days prior to procedure.

## 2016-11-21 ENCOUNTER — Ambulatory Visit: Payer: BLUE CROSS/BLUE SHIELD | Admitting: Anesthesiology

## 2016-11-21 ENCOUNTER — Encounter
Admission: RE | Disposition: A | Discharge status: Home / Self Care | Payer: Self-pay | Source: Ambulatory Visit | Attending: Unknown Physician Specialty

## 2016-11-21 ENCOUNTER — Ambulatory Visit
Admission: RE | Admit: 2016-11-21 | Discharge: 2016-11-21 | Disposition: A | Discharge status: Home / Self Care | Payer: BLUE CROSS/BLUE SHIELD | Source: Ambulatory Visit | Attending: Unknown Physician Specialty | Admitting: Unknown Physician Specialty

## 2016-11-21 ENCOUNTER — Encounter: Payer: Self-pay | Admitting: *Deleted

## 2016-11-21 DIAGNOSIS — K219 Gastro-esophageal reflux disease without esophagitis: Secondary | ICD-10-CM | POA: Diagnosis present

## 2016-11-21 DIAGNOSIS — Z86010 Personal history of colonic polyps: Secondary | ICD-10-CM | POA: Insufficient documentation

## 2016-11-21 DIAGNOSIS — Z90722 Acquired absence of ovaries, bilateral: Secondary | ICD-10-CM | POA: Insufficient documentation

## 2016-11-21 DIAGNOSIS — Z85828 Personal history of other malignant neoplasm of skin: Secondary | ICD-10-CM | POA: Insufficient documentation

## 2016-11-21 DIAGNOSIS — K5909 Other constipation: Secondary | ICD-10-CM | POA: Insufficient documentation

## 2016-11-21 DIAGNOSIS — Z881 Allergy status to other antibiotic agents status: Secondary | ICD-10-CM | POA: Insufficient documentation

## 2016-11-21 DIAGNOSIS — K317 Polyp of stomach and duodenum: Secondary | ICD-10-CM | POA: Insufficient documentation

## 2016-11-21 DIAGNOSIS — E785 Hyperlipidemia, unspecified: Secondary | ICD-10-CM | POA: Insufficient documentation

## 2016-11-21 DIAGNOSIS — Z8249 Family history of ischemic heart disease and other diseases of the circulatory system: Secondary | ICD-10-CM | POA: Insufficient documentation

## 2016-11-21 DIAGNOSIS — M4120 Other idiopathic scoliosis, site unspecified: Secondary | ICD-10-CM | POA: Insufficient documentation

## 2016-11-21 DIAGNOSIS — Z91040 Latex allergy status: Secondary | ICD-10-CM | POA: Insufficient documentation

## 2016-11-21 DIAGNOSIS — Z9981 Dependence on supplemental oxygen: Secondary | ICD-10-CM | POA: Insufficient documentation

## 2016-11-21 DIAGNOSIS — G894 Chronic pain syndrome: Secondary | ICD-10-CM | POA: Insufficient documentation

## 2016-11-21 DIAGNOSIS — Z96653 Presence of artificial knee joint, bilateral: Secondary | ICD-10-CM | POA: Diagnosis not present

## 2016-11-21 DIAGNOSIS — K449 Diaphragmatic hernia without obstruction or gangrene: Secondary | ICD-10-CM | POA: Insufficient documentation

## 2016-11-21 DIAGNOSIS — Z90710 Acquired absence of both cervix and uterus: Secondary | ICD-10-CM | POA: Insufficient documentation

## 2016-11-21 DIAGNOSIS — D509 Iron deficiency anemia, unspecified: Secondary | ICD-10-CM | POA: Diagnosis not present

## 2016-11-21 DIAGNOSIS — K640 First degree hemorrhoids: Secondary | ICD-10-CM | POA: Diagnosis not present

## 2016-11-21 DIAGNOSIS — Z803 Family history of malignant neoplasm of breast: Secondary | ICD-10-CM | POA: Insufficient documentation

## 2016-11-21 DIAGNOSIS — J439 Emphysema, unspecified: Secondary | ICD-10-CM | POA: Insufficient documentation

## 2016-11-21 DIAGNOSIS — E7800 Pure hypercholesterolemia, unspecified: Secondary | ICD-10-CM | POA: Diagnosis not present

## 2016-11-21 DIAGNOSIS — I480 Paroxysmal atrial fibrillation: Secondary | ICD-10-CM | POA: Insufficient documentation

## 2016-11-21 DIAGNOSIS — Z87891 Personal history of nicotine dependence: Secondary | ICD-10-CM | POA: Insufficient documentation

## 2016-11-21 DIAGNOSIS — Z9842 Cataract extraction status, left eye: Secondary | ICD-10-CM | POA: Insufficient documentation

## 2016-11-21 DIAGNOSIS — K5730 Diverticulosis of large intestine without perforation or abscess without bleeding: Secondary | ICD-10-CM | POA: Diagnosis not present

## 2016-11-21 DIAGNOSIS — E039 Hypothyroidism, unspecified: Secondary | ICD-10-CM | POA: Insufficient documentation

## 2016-11-21 DIAGNOSIS — M810 Age-related osteoporosis without current pathological fracture: Secondary | ICD-10-CM | POA: Insufficient documentation

## 2016-11-21 DIAGNOSIS — Z7951 Long term (current) use of inhaled steroids: Secondary | ICD-10-CM | POA: Insufficient documentation

## 2016-11-21 DIAGNOSIS — Z79899 Other long term (current) drug therapy: Secondary | ICD-10-CM | POA: Diagnosis not present

## 2016-11-21 DIAGNOSIS — I272 Pulmonary hypertension, unspecified: Secondary | ICD-10-CM | POA: Diagnosis not present

## 2016-11-21 DIAGNOSIS — Z9841 Cataract extraction status, right eye: Secondary | ICD-10-CM | POA: Insufficient documentation

## 2016-11-21 DIAGNOSIS — Z7901 Long term (current) use of anticoagulants: Secondary | ICD-10-CM | POA: Insufficient documentation

## 2016-11-21 DIAGNOSIS — Z9889 Other specified postprocedural states: Secondary | ICD-10-CM | POA: Diagnosis not present

## 2016-11-21 DIAGNOSIS — Z8041 Family history of malignant neoplasm of ovary: Secondary | ICD-10-CM | POA: Insufficient documentation

## 2016-11-21 DIAGNOSIS — Z87442 Personal history of urinary calculi: Secondary | ICD-10-CM | POA: Diagnosis not present

## 2016-11-21 DIAGNOSIS — Z8701 Personal history of pneumonia (recurrent): Secondary | ICD-10-CM | POA: Insufficient documentation

## 2016-11-21 DIAGNOSIS — Z800 Family history of malignant neoplasm of digestive organs: Secondary | ICD-10-CM | POA: Insufficient documentation

## 2016-11-21 DIAGNOSIS — K625 Hemorrhage of anus and rectum: Secondary | ICD-10-CM | POA: Diagnosis present

## 2016-11-21 DIAGNOSIS — Z882 Allergy status to sulfonamides status: Secondary | ICD-10-CM | POA: Insufficient documentation

## 2016-11-21 HISTORY — PX: COLONOSCOPY WITH PROPOFOL: SHX5780

## 2016-11-21 HISTORY — PX: ESOPHAGOGASTRODUODENOSCOPY: SHX5428

## 2016-11-21 SURGERY — EGD (ESOPHAGOGASTRODUODENOSCOPY)
Anesthesia: General

## 2016-11-21 MED ORDER — PROPOFOL 500 MG/50ML IV EMUL
INTRAVENOUS | Status: AC
  Filled 2016-11-21: qty 50

## 2016-11-21 MED ORDER — PIPERACILLIN-TAZOBACTAM 3.375 G IVPB
INTRAVENOUS | Status: AC
  Administered 2016-11-21: 3.375 g via INTRAVENOUS
  Filled 2016-11-21: qty 50

## 2016-11-21 MED ORDER — LIDOCAINE HCL (PF) 2 % IJ SOLN
INTRAMUSCULAR | Status: AC
  Filled 2016-11-21: qty 2

## 2016-11-21 MED ORDER — GLYCOPYRROLATE 0.2 MG/ML IJ SOLN
INTRAMUSCULAR | Status: AC
  Filled 2016-11-21: qty 1

## 2016-11-21 MED ORDER — FENTANYL CITRATE (PF) 100 MCG/2ML IJ SOLN
INTRAMUSCULAR | Status: DC | PRN
  Administered 2016-11-21 (×2): 50 ug via INTRAVENOUS

## 2016-11-21 MED ORDER — MIDAZOLAM HCL 5 MG/5ML IJ SOLN
INTRAMUSCULAR | Status: DC | PRN
  Administered 2016-11-21 (×3): 2 mg via INTRAVENOUS

## 2016-11-21 MED ORDER — PIPERACILLIN-TAZOBACTAM 3.375 G IVPB 30 MIN
3.3750 g | Freq: Once | INTRAVENOUS | Status: AC
  Administered 2016-11-21: 3.375 g via INTRAVENOUS
  Filled 2016-11-21: qty 50

## 2016-11-21 MED ORDER — LIDOCAINE HCL (PF) 2 % IJ SOLN
INTRAMUSCULAR | Status: DC | PRN
  Administered 2016-11-21: 40 mg

## 2016-11-21 MED ORDER — IPRATROPIUM-ALBUTEROL 0.5-2.5 (3) MG/3ML IN SOLN
RESPIRATORY_TRACT | Status: AC
  Filled 2016-11-21: qty 3

## 2016-11-21 MED ORDER — EPHEDRINE SULFATE 50 MG/ML IJ SOLN
INTRAMUSCULAR | Status: DC | PRN
  Administered 2016-11-21: 15 mg via INTRAVENOUS
  Administered 2016-11-21: 10 mg via INTRAVENOUS

## 2016-11-21 MED ORDER — SODIUM CHLORIDE 0.9 % IV SOLN: INTRAVENOUS | Status: DC

## 2016-11-21 MED ORDER — PROPOFOL 10 MG/ML IV BOLUS
INTRAVENOUS | Status: DC | PRN
  Administered 2016-11-21: 50 mg via INTRAVENOUS

## 2016-11-21 MED ORDER — MIDAZOLAM HCL 2 MG/2ML IJ SOLN
INTRAMUSCULAR | Status: AC
  Filled 2016-11-21: qty 2

## 2016-11-21 MED ORDER — SODIUM CHLORIDE 0.9 % IV SOLN
INTRAVENOUS | Status: DC
  Administered 2016-11-21: 10:00:00 via INTRAVENOUS

## 2016-11-21 MED ORDER — IPRATROPIUM-ALBUTEROL 0.5-2.5 (3) MG/3ML IN SOLN
3.0000 mL | Freq: Once | RESPIRATORY_TRACT | Status: AC
  Administered 2016-11-21: 3 mL via RESPIRATORY_TRACT

## 2016-11-21 MED ORDER — PROPOFOL 500 MG/50ML IV EMUL
INTRAVENOUS | Status: DC | PRN
  Administered 2016-11-21: 100 ug/kg/min via INTRAVENOUS

## 2016-11-21 MED ORDER — PHENYLEPHRINE HCL 10 MG/ML IJ SOLN
INTRAMUSCULAR | Status: DC | PRN
  Administered 2016-11-21: 100 ug via INTRAVENOUS
  Administered 2016-11-21 (×2): 200 ug via INTRAVENOUS
  Administered 2016-11-21: 100 ug via INTRAVENOUS

## 2016-11-21 MED ORDER — IPRATROPIUM-ALBUTEROL 0.5-2.5 (3) MG/3ML IN SOLN
RESPIRATORY_TRACT | Status: AC
  Administered 2016-11-21: 3 mL via RESPIRATORY_TRACT
  Filled 2016-11-21: qty 3

## 2016-11-21 MED ORDER — FENTANYL CITRATE (PF) 100 MCG/2ML IJ SOLN
INTRAMUSCULAR | Status: AC
  Filled 2016-11-21: qty 2

## 2016-11-21 NOTE — Anesthesia Postprocedure Evaluation (Signed)
Anesthesia Post Note  Patient: Anwita Mencer  Procedure(s) Performed: Procedure(s) (LRB): ESOPHAGOGASTRODUODENOSCOPY (EGD) (N/A) COLONOSCOPY WITH PROPOFOL (N/A)  Patient location during evaluation: Endoscopy Anesthesia Type: General Level of consciousness: awake and alert Pain management: pain level controlled Vital Signs Assessment: post-procedure vital signs reviewed and stable Respiratory status: spontaneous breathing, nonlabored ventilation, respiratory function stable and patient connected to nasal cannula oxygen Cardiovascular status: blood pressure returned to baseline and stable Postop Assessment: no apparent nausea or vomiting Anesthetic complications: no     Last Vitals:  Vitals:   11/21/16 1107 11/21/16 1117  BP: (!) 87/55 (!) 94/52  Pulse: 79 76  Resp: 17 19  Temp:    SpO2: 96% 92%    Last Pain:  Vitals:   11/21/16 1047  TempSrc: Tympanic                 Precious Haws Piscitello

## 2016-11-21 NOTE — Transfer of Care (Signed)
Immediate Anesthesia Transfer of Care Note  Patient: Sonya [name]  Procedure(s) Performed: Procedure(s): ESOPHAGOGASTRODUODENOSCOPY (EGD) (N/A) COLONOSCOPY WITH PROPOFOL (N/A)  Patient Location: PACU  Anesthesia Type:General  Level of Consciousness: sedated  Airway & Oxygen Therapy: Patient Spontanous Breathing and Patient connected to nasal cannula oxygen  Post-op Assessment: Report given to RN and Post -op Vital signs reviewed and stable  Post vital signs: Reviewed and stable  Last Vitals:  Vitals:   11/21/16 0936 11/21/16 1047  BP: 125/88   Pulse: 82 82  Resp: 14 15  Temp: 36.4 C (!) 36.1 C  SpO2: 100% 92%    Last Pain:  Vitals:   11/21/16 1047  TempSrc: Tympanic         Complications: No apparent anesthesia complications

## 2016-11-21 NOTE — Anesthesia Postprocedure Evaluation (Signed)
Anesthesia Post Note  Patient: Sonya [name]  Procedure(s) Performed: Procedure(s) (LRB): ESOPHAGOGASTRODUODENOSCOPY (EGD) (N/A) COLONOSCOPY WITH PROPOFOL (N/A)  Patient location during evaluation: Endoscopy Anesthesia Type: General Level of consciousness: awake and alert Pain management: pain level controlled Vital Signs Assessment: post-procedure vital signs reviewed and stable Respiratory status: spontaneous breathing, nonlabored ventilation, respiratory function stable and patient connected to nasal cannula oxygen Cardiovascular status: blood pressure returned to baseline and stable Postop Assessment: no apparent nausea or vomiting Anesthetic complications: no     Last Vitals:  Vitals:   11/21/16 1107 11/21/16 1117  BP: (!) 87/55 (!) 94/52  Pulse: 79 76  Resp: 17 19  Temp:    SpO2: 96% 92%    Last Pain:  Vitals:   11/21/16 1047  TempSrc: Tympanic                 Precious Haws Absalom Aro

## 2016-11-21 NOTE — Anesthesia Post-op Follow-up Note (Signed)
Anesthesia QCDR form completed.        

## 2016-11-21 NOTE — Anesthesia Preprocedure Evaluation (Signed)
Anesthesia Evaluation  Patient identified by MRN, date of birth, ID band Patient awake    Reviewed: Allergy & Precautions, H&P , NPO status , Patient's Chart, lab work & pertinent test results  History of Anesthesia Complications Negative for: history of anesthetic complications  Airway Mallampati: III  TM Distance: <3 FB Neck ROM: limited    Dental  (+) Poor Dentition, Chipped   Pulmonary neg shortness of breath, asthma , pneumonia, COPD, former smoker,           Cardiovascular Exercise Tolerance: Good (-) angina(-) Past MI and (-) DOE + dysrhythmias      Neuro/Psych PSYCHIATRIC DISORDERS Anxiety Depression  Neuromuscular disease    GI/Hepatic negative GI ROS, Neg liver ROS, hiatal hernia, GERD  Medicated and Controlled,  Endo/Other  negative endocrine ROSHypothyroidism   Renal/GU negative Renal ROS  negative genitourinary   Musculoskeletal  (+) Arthritis ,   Abdominal   Peds  Hematology negative hematology ROS (+)   Anesthesia Other Findings Past Medical History: No date: Allergic rhinitis, cause unspecified No date: Anemia No date: Anxiety No date: Arthritis No date: Benign neoplasm of colon No date: Bruising No date: Chest pain     Comment:  a. 2015 Neg MV. No date: Chronic pain syndrome No date: Degeneration of lumbar or lumbosacral intervertebral disc No date: Depressive disorder, not elsewhere classified No date: Edema No date: Emphysema No date: GERD (gastroesophageal reflux disease) No date: Hematuria, unspecified No date: Hiatal hernia No date: HLD (hyperlipidemia) No date: Hyperglycemia No date: Insomnia, unspecified No date: Lumbosacral spondylosis without myelopathy No date: Neurotic excoriations No date: Osteoporosis, unspecified No date: PAF (paroxysmal atrial fibrillation) ([place])     Comment:  a. 08/2015 in setting of asp pna-->converted on dilt.               Xarelto started  (CHA2DS2VASc= 1). 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 08/2015 Echo: EF 50-55%, no rwma, gr1 DD, mild MR,               PASP 55mmHg.  No date: Pure hypercholesterolemia No date: Scoliosis (and kyphoscoliosis), idiopathic No date: Squamous cell carcinoma No date: Tobacco use disorder No date: Unspecified hypothyroidism No date: Unspecified vitamin D deficiency No date: UTI (urinary tract infection)     Comment:  frequent  Past Surgical History: 1980: 2 harrington rods in back No date: ABDOMINAL HYSTERECTOMY     Comment:  endometriosis; ovaries resected; no cancer. 2006: BUNIONECTOMY     Comment:  both feet No date: CATARACT EXTRACTION, BILATERAL 1980: CERVICAL SPINE SURGERY     Comment:  [place] No date: EYE SURGERY 03/16/12: JOINT REPLACEMENT     Comment:  L TKR; R TKR 1980: lumbar spine surgery     Comment:  [place] 02/2010: squamous cell carcinoma resection     Comment:  [place] 1964: TONSILLECTOMY AND ADENOIDECTOMY 1990: VAGINAL HYSTERECTOMY  BMI    Body Mass Index:  35.67 kg/m      Reproductive/Obstetrics negative OB ROS                             Anesthesia Physical Anesthesia Plan  ASA: III  Anesthesia Plan: General   Post-op Pain Management:    Induction: Intravenous  PONV Risk Score and Plan: Propofol infusion  Airway Management Planned: Natural Airway and Nasal Cannula  Additional Equipment:   Intra-op Plan:   Post-operative Plan:   Informed Consent: I have reviewed the patients History and Physical, chart, labs and discussed the procedure including the risks, benefits and alternatives for the proposed anesthesia with the patient or authorized representative who has indicated his/her understanding and acceptance.   Dental Advisory Given  Plan Discussed with: Anesthesiologist, CRNA and Surgeon  Anesthesia  Plan Comments: (Patient consented for risks of anesthesia including but not limited to:  - adverse reactions to medications - risk of intubation if required - damage to teeth, lips or other oral mucosa - sore throat or hoarseness - Damage to heart, brain, lungs or loss of life  Patient voiced understanding.)        Anesthesia Quick Evaluation

## 2016-11-21 NOTE — Op Note (Signed)
[facility] Gastroenterology Patient Name: Sonya [name] Procedure Date: 11/21/2016 9:40 AM MRN: [id number] Account #: [id number] Date of Birth: 24-Nov-1950 Admit Type: Outpatient Age: 66 Room: [facility] ENDO ROOM 3 Gender: Female Note Status: Finalized Procedure:            Upper GI endoscopy Indications:          Unexplained iron deficiency anemia Providers:            Manya Silvas, MD Referring MD:         Silvana Newness (Referring MD) Medicines:            Propofol per Anesthesia Complications:        No immediate complications. Procedure:            Pre-Anesthesia Assessment:                       - After reviewing the risks and benefits, the patient                        was deemed in satisfactory condition to undergo the                        procedure.                       After obtaining informed consent, the endoscope was                        passed under direct vision. Throughout the procedure,                        the patient's blood pressure, pulse, and oxygen                        saturations were monitored continuously. The                        Colonoscope was introduced through the mouth, and                        advanced to the second part of duodenum. The upper GI                        endoscopy was accomplished without difficulty. The                        patient tolerated the procedure well. Findings:      The examined esophagus was normal. 39cm.      A few small sessile polyps with no bleeding and no stigmata of recent       bleeding were found in the gastric body. Two tiny superficial spots seen       in body without any adherent blood.      The examined duodenum was normal. Bulb and second portion normal.      No blood anywhere. Impression:           - Normal esophagus.                       - A few gastric polyps.                       -  Normal examined duodenum.                       - No specimens  collected. Recommendation:       - Perform a colonoscopy as previously scheduled. Manya Silvas, MD 11/21/2016 10:24:10 AM This report has been signed electronically. Number of Addenda: 0 Note Initiated On: 11/21/2016 9:40 AM      [facility]

## 2016-11-21 NOTE — Op Note (Addendum)
[facility] Gastroenterology Patient Name: Sonya [name] Procedure Date: 11/21/2016 9:37 AM MRN: [id number] Account #: [id number] Date of Birth: 02/01/1951 Admit Type: Outpatient Age: 66 Room: [facility] ENDO ROOM 3 Gender: Female Note Status: Finalized Procedure:            Colonoscopy Indications:          Rectal bleeding Providers:            Manya Silvas, MD Medicines:            Propofol per Anesthesia Complications:        No immediate complications. Procedure:            Pre-Anesthesia Assessment:                       - After reviewing the risks and benefits, the patient                        was deemed in satisfactory condition to undergo the                        procedure.                       After obtaining informed consent, the colonoscope was                        passed under direct vision. Throughout the procedure,                        the patient's blood pressure, pulse, and oxygen                        saturations were monitored continuously. The                        Colonoscope was introduced through the anus and                        advanced to the the cecum, identified by appendiceal                        orifice and ileocecal valve. The colonoscopy was                        performed without difficulty. The patient tolerated the                        procedure well. The quality of the bowel preparation                        was excellent. Findings:      Two small-mouthed diverticula were found in the sigmoid colon.      Internal hemorrhoids were found during retroflexion. The hemorrhoids       were medium-sized and Grade I (internal hemorrhoids that do not       prolapse).      The exam was otherwise without abnormality. Impression:           - Diverticulosis in the sigmoid colon.                       -  Internal hemorrhoids.                       - The examination was otherwise normal.                       - No  specimens collected. Recommendation:       - The findings and recommendations were discussed with                        the patient's family. Iron supplement either iv or oral. Manya Silvas, MD 11/21/2016 10:57:11 AM This report has been signed electronically. Number of Addenda: 0 Note Initiated On: 11/21/2016 9:37 AM Scope Withdrawal Time: 0 hours 11 minutes 38 seconds  Total Procedure Duration: 0 hours 18 minutes 53 seconds       [facility]

## 2016-11-21 NOTE — H&P (Signed)
Primary Care Physician:  Wardell Honour, MD Primary Gastroenterologist:  Dr. Vira Agar  Pre-Procedure History & Physical: HPI:  Sonya [name] is a 66 y.o. female is here for an endoscopy and colonoscopy.   Past Medical History:  Diagnosis Date  . Allergic rhinitis, cause unspecified   . Anemia   . Anxiety   . Arthritis   . Benign neoplasm of colon   . Bruising   . Chest pain    a. 2015 Neg MV.  . Chronic pain syndrome   . Degeneration of lumbar or lumbosacral intervertebral disc   . Depressive disorder, not elsewhere classified   . Edema   . Emphysema   . GERD (gastroesophageal reflux disease)   . Hematuria, unspecified   . Hiatal hernia   . HLD (hyperlipidemia)   . Hyperglycemia   . Insomnia, unspecified   . Lumbosacral spondylosis without myelopathy   . Neurotic excoriations   . Osteoporosis, unspecified   . PAF (paroxysmal atrial fibrillation) ([place])    a. 08/2015 in setting of asp pna-->converted on dilt. Xarelto started (CHA2DS2VASc= 1).  . Personal history of urinary calculi   . Pneumonia   . Prurigo nodularis   . Pulmonary hypertension ([place])    a. 08/2015 Echo: EF 50-55%, no rwma, gr1 DD, mild MR, PASP 36mmHg.   Marland Kitchen Pure hypercholesterolemia   . Scoliosis (and kyphoscoliosis), idiopathic   . Squamous cell carcinoma   . Tobacco use disorder   . Unspecified hypothyroidism   . Unspecified vitamin D deficiency   . UTI (urinary tract infection)    frequent    Past Surgical History:  Procedure Laterality Date  . 2 harrington rods in back  1980  . ABDOMINAL HYSTERECTOMY     endometriosis; ovaries resected; no cancer.  [place]  2006   both feet  . CATARACT EXTRACTION, BILATERAL    . [place]  . EYE SURGERY    . JOINT REPLACEMENT  03/16/12   L TKR; R TKR  . lumbar spine surgery  [place]  . squamous cell carcinoma resection  02/2010   [name]  . TONSILLECTOMY AND ADENOIDECTOMY  1964  . VAGINAL  HYSTERECTOMY  1990    Prior to Admission medications   Medication Sig Start Date End Date Taking? Authorizing Provider  albuterol (PROVENTIL HFA;VENTOLIN HFA) 108 (90 Base) MCG/ACT inhaler Inhale 2 puffs into the lungs every 6 (six) hours as needed. 07/30/16  Yes Wardell Honour, MD  AMITIZA 24 MCG capsule TAKE 1 CAPSULE BY MOUTH TWICE DAILY AS NEEDED 04/27/15  Yes [provider]  baclofen (LIORESAL) 10 MG tablet Take 0.5 tablets (5 mg total) by mouth 2 (two) times daily. 09/14/15  Yes Kelvin Cellar, MD  clonazePAM (KLONOPIN) 0.5 MG tablet Take 1 tablet (0.5 mg total) by mouth 2 (two) times daily. 06/09/16  Yes Clapacs, Madie Reno, MD  diltiazem (CARDIZEM CD) 240 MG 24 hr capsule Take 1 capsule (240 mg total) by mouth daily. 07/10/16  Yes Gollan, Kathlene November, MD  DULoxetine (CYMBALTA) 60 MG capsule Take 1 capsule (60 mg total) by mouth daily. 06/09/16  Yes Clapacs, Madie Reno, MD  fexofenadine (ALLEGRA) 180 MG tablet Take 1 tablet (180 mg total) by mouth daily. 01/02/16  Yes Wardell Honour, MD  fluticasone Asencion Islam) 50 MCG/ACT nasal spray Place 2 sprays into both nostrils daily. 07/30/16  Yes Wardell Honour, MD  levothyroxine (SYNTHROID) 50 MCG tablet Take 1 tablet (50 mcg  total) by mouth daily before breakfast. 07/30/16  Yes Wardell Honour, MD  montelukast (SINGULAIR) 10 MG tablet Take 10 mg by mouth at bedtime. 09/04/15  Yes [provider]  pantoprazole (PROTONIX) 40 MG tablet Take 1 tablet (40 mg total) by mouth daily at 12 noon. 07/30/16  Yes Wardell Honour, MD  pregabalin (LYRICA) 50 MG capsule Take 50 mg by mouth 2 (two) times daily.   Yes [provider]  rosuvastatin (CRESTOR) 20 MG tablet Take 1 tablet (20 mg total) by mouth daily. 07/30/16  Yes Wardell Honour, MD  traMADol (ULTRAM) 50 MG tablet Take 100 mg by mouth every 6 (six) hours as needed.   Yes [provider]  azithromycin (ZITHROMAX) 250 MG tablet Take 2 tabs PO x 1 dose, then 1 tab PO QD x 4 days Patient not  taking: Reported on 11/21/2016 07/30/16   Wardell Honour, MD  OXYGEN Inhale 2 L into the lungs at bedtime.    [provider]  rivaroxaban (XARELTO) 20 MG TABS tablet Take 1 tablet (20 mg total) by mouth daily with supper. 07/10/16   Minna Merritts, MD  tretinoin (RETIN-A) 0.1 % cream Apply a pea sized amount to the face nightly 20 min after washing, avoiding area around the eyes and creases around the nose. 01/25/14   [provider]  triamcinolone cream (KENALOG) 0.1 % Apply 1 application topically 2 (two) times daily. 07/30/16   Wardell Honour, MD  zolpidem (AMBIEN) 10 MG tablet Take 1 tablet (10 mg total) by mouth at bedtime as needed for sleep. 06/09/16 09/07/16  Clapacs, Madie Reno, MD    Allergies as of 11/14/2016 - Review Complete 07/30/2016  Allergen Reaction Noted  . Latex Other (See Comments) 11/06/2014  . Levofloxacin  11/13/2009  . Sulfonamide derivatives  11/13/2009    Family History  Problem Relation Age of Onset  . Coronary artery disease Brother        stents    SIster stents also  . Heart disease Brother   . Heart failure Brother        CABG  AMI @ 25  . Heart disease Brother   . Heart failure Mother        pacemaker  . Hypertension Mother   . Heart disease Mother        CHF; pacemaker  . Heart failure Father        AMI  . Cancer Father 45       colon cancer  . Heart disease Father        mutliple AMIs  . Cancer Maternal Grandmother        ovarian  . Heart disease Sister 59       cardiac stenting  . Heart disease Brother   . Heart disease Brother   . GI problems Unknown        Family history malignancy, GI tract  . Breast cancer Cousin     Social History   Social History  . Marital status: Married    Spouse name: Delvina Mizzell  . Number of children: 1  . Years of education: 64   Occupational History  . disabled     56   DDD   Social History Main Topics  . Smoking status: Former Smoker    Packs/day: 1.00    Years: 25.00     Types: Cigarettes    Start date: 11/05/1984    Quit date: 11/06/2006  . Smokeless  tobacco: Never Used     Comment: quit 11/07  . Alcohol use No  . Drug use: No  . Sexual activity: Yes    Partners: Male   Other Topics Concern  . Not on file   Social History Narrative   Marital status:  Married since 1992; second marriage; happily married; no abuse.      Children:  1 daughter; 3 stepchildren; 8 grandchildren; 2 gg.   Daughter in [place].  Husband's children in [place].      Employment:  Retired/disabled due to DDD lumbar.       Lives:   Lives with spouse;      Caffeine use cosumes a moderate amount 1 serving per day.      Always uses seat belts.       No guns in home.       Smoke alarm and carbon monoxide detectors in the home.       No Living Will.    Review of Systems: See HPI, otherwise negative ROS  Physical Exam: BP 125/88   Pulse 82   Temp 97.6 F (36.4 C) (Tympanic)   Resp 14   Ht 5\' 2"  (1.575 m)   Wt 88.5 kg (195 lb)   SpO2 100%   BMI 35.67 kg/m  General:   Alert,  pleasant and cooperative in NAD Head:  Normocephalic and atraumatic. Neck:  Supple; no masses or thyromegaly. Lungs:  Clear throughout to auscultation.    Heart:  Regular rate and rhythm. Abdomen:  Soft, mild tenderness on exam and nondistended. Normal bowel sounds, without guarding, and without rebound.   Neurologic:  Alert and  oriented x4;  grossly normal neurologically.  Impression/Plan: Sonya [name] is here for an endoscopy and colonoscopy to be performed for heme positive stool and iron def anemia.  Risks, benefits, limitations, and alternatives regarding  endoscopy and colonoscopy have been reviewed with the patient.  Questions have been answered.  All parties agreeable.   Gaylyn Cheers, MD  11/21/2016, 10:06 AM

## 2016-11-24 ENCOUNTER — Telehealth: Payer: Self-pay

## 2016-11-24 NOTE — Telephone Encounter (Signed)
received a fax requesting a refill on  clonazepam .5mg  and zolpidem tartrate 10mg  pt was last seen on  06-09-16, next appt  12-09-16.   zolpidem (AMBIEN) 10 MG tablet  Medication  Date: 06/09/2016 Department: [place] Ordering/Authorizing: Clapacs, Madie Reno, MD  Order Providers   Prescribing Provider Encounter Provider  Clapacs, Madie Reno, MD Clapacs, Madie Reno, MD  Medication Detail    Disp Refills Start End   zolpidem (AMBIEN) 10 MG tablet 90 tablet 1 06/09/2016 09/07/2016   Sig - Route: Take 1 tablet (10 mg total) by mouth at bedtime as needed for sleep. - Oral   Class: [place]   clonazePAM (KLONOPIN) 0.5 MG tablet  Medication  Date: 06/09/2016 Department: [facility] Ordering/Authorizing: Clapacs, Madie Reno, MD  Order Providers   Prescribing Provider Encounter Provider  Clapacs, Madie Reno, MD Clapacs, Madie Reno, MD  Medication Detail    Disp Refills Start End   clonazePAM (KLONOPIN) 0.5 MG tablet 180 tablet 1 06/09/2016    Sig - Route: Take 1 tablet (0.5 mg total) by mouth 2 (two) times daily. - Oral   Class: [place]

## 2016-11-27 ENCOUNTER — Ambulatory Visit (INDEPENDENT_AMBULATORY_CARE_PROVIDER_SITE_OTHER): Payer: BLUE CROSS/BLUE SHIELD | Admitting: General Surgery

## 2016-11-27 ENCOUNTER — Encounter: Payer: Self-pay | Admitting: General Surgery

## 2016-11-27 VITALS — BP 120/70 | HR 72 | Resp 15 | Ht 62.0 in | Wt 192.0 lb

## 2016-11-27 DIAGNOSIS — K625 Hemorrhage of anus and rectum: Secondary | ICD-10-CM

## 2016-11-27 DIAGNOSIS — K648 Other hemorrhoids: Secondary | ICD-10-CM | POA: Diagnosis not present

## 2016-11-27 DIAGNOSIS — K602 Anal fissure, unspecified: Secondary | ICD-10-CM

## 2016-11-27 NOTE — Patient Instructions (Addendum)
Capsule endoscopy is already scheduled for Tuesday 12-02-16. May use preparation H or Anusol (over the counter) to the rectal area twice a day  About Hemorrhoids  Hemorrhoids are swollen veins in the lower rectum and anus.  Also called piles, hemorrhoids are a common problem.  Hemorrhoids may be internal (inside the rectum) or external (around the anus).  Internal Hemorrhoids  Internal hemorrhoids are often painless, but they rarely cause bleeding.  The internal veins may stretch and fall down (prolapse) through the anus to the outside of the body.  The veins may then become irritated and painful.  External Hemorrhoids  External hemorrhoids can be easily seen or felt around the anal opening.  They are under the skin around the anus.  When the swollen veins are scratched or broken by straining, rubbing or wiping they sometimes bleed.  How Hemorrhoids Occur  Veins in the rectum and around the anus tend to swell under pressure.  Hemorrhoids can result from increased pressure in the veins of your anus or rectum.  Some sources of pressure are:   Straining to have a bowel movement because of constipation  Waiting too long to have a bowel movement  Coughing and sneezing often  Sitting for extended periods of time, including on the toilet  Diarrhea  Obesity  Trauma or injury to the anus  Some liver diseases  Stress  Family history of hemorrhoids  Pregnancy  Pregnant women should try to avoid becoming constipated, because they are more likely to have hemorrhoids during pregnancy.  In the last trimester of pregnancy, the enlarged uterus may press on blood vessels and causes hemorrhoids.  In addition, the strain of childbirth sometimes causes hemorrhoids after the birth.  Symptoms of Hemorrhoids  Some symptoms of hemorrhoids include:  Swelling and/or a tender lump around the anus  Itching, mild burning and bleeding around the anus  Painful bowel movements with or without  constipation  Bright red blood covering the stool, on toilet paper or in the toilet bowel.   Symptoms usually go away within a few days.  Always talk to your doctor about any bleeding to make sure it is not from some other causes.  Diagnosing and Treating Hemorrhoids  Diagnosis is made by an examination by your healthcare provider.  Special test can be performed by your doctor.    Most cases of hemorrhoids can be treated with:  High-fiber diet: Eat more high-fiber foods, which help prevent constipation.  Ask for more detailed fiber information on types and sources of fiber from your healthcare provider.  Fluids: Drink plenty of water.  This helps soften bowel movements so they are easier to pass.  Sitz baths and cold packs: Sitting in lukewarm water two or three times a day for 15 minutes cleases the anal area and may relieve discomfort.  If the water is too hot, swelling around the anus will get worse.  Placing a cloth-covered ice pack on the anus for ten minutes four times a day can also help reduce selling.  Gently pushing a prolapsed hemorrhoid back inside after the bath or ice pack can be helpful.  Medications: For mild discomfort, your healthcare provider may suggest over-the-counter pain medication or prescribe a cream or ointment for topical use.  The cream may contain witch hazel, zinc oxide or petroleum jelly.  Medicated suppositories are also a treatment option.  Always consult your doctor before applying medications or creams.  Procedures and surgeries: There are also a number of  procedures and surgeries to shrink or remove hemorrhoids in more serious cases.  Talk to your physician about these options.  You can often prevent hemorrhoids or keep them from becoming worse by maintaining a healthy lifestyle.  Eat a fiber-rich diet of fruits, vegetables and whole grains.  Also, drink plenty of water and exercise regularly.   2007, [employer] Doc.30Anal Fissure, Adult An  anal fissure is a small tear or crack in the skin around the opening of the butt (anus).Bleeding from the tear or crack usually stops on its own within a few minutes. The bleeding may happen every time you poop (have a bowel movement) until the tear or crack heals. Follow these instructions at home: Eating and drinking  Avoid bananas and dairy products. These foods can make it hard to poop.  Drink enough fluid to keep your pee (urine) clear or pale yellow.  Eat a lot of fruit, whole grains, and vegetables. General instructions  Keep the butt area as clean and dry as you can.  Take a warm water bath (sitz bath) as told by your doctor. Do not use soap.  Take over-the-counter and prescription medicines only as told by your doctor.  Use creams or ointments only as told by your doctor.  Keep all follow-up visits as told by your doctor. This is important. Contact a doctor if:  You have more bleeding.  You have a fever.  You have watery poop (diarrhea) that is mixed with blood.  You have pain.  You problem gets worse, not better. This information is not intended to replace advice given to you by your health care provider. Make sure you discuss any questions you have with your health care provider. Document Released: 10/09/2010 Document Revised: 07/19/2015 Document Reviewed: 05/08/2014 Elsevier Interactive Patient Education  [employer].

## 2016-11-27 NOTE — Progress Notes (Signed)
Patient ID: Sonya [name], female   DOB: 11/08/1950, 66 y.o.   MRN: [id number]  Chief Complaint  Patient presents with  . Rectal Problems    HPI Sonya [name] is a 66 y.o. female.  Here for evaluation of hemorrhoids referred by Dr Sonya [name]. Colonoscopy was 11-21-16. She is having bleeding and rectal pain for about a year that has progressively gotten worse within the last 6 months. She states it is like a "corn cob" in her rectum. Bowels move about every other day with the help of Linzess she was previously on Amitiza . Denies any hemorrhoid surgery. Bleeding is not with every bowel movement. She is on Xarelto for Afib.   HPI  Past Medical History:  Diagnosis Date  . Allergic rhinitis, cause unspecified   . Anemia   . Anxiety   . Arthritis   . Benign neoplasm of colon   . Bruising   . Chest pain    a. 2015 Neg MV.  . Chronic pain syndrome   . Degeneration of lumbar or lumbosacral intervertebral disc   . Depressive disorder, not elsewhere classified   . Edema   . Emphysema   . GERD (gastroesophageal reflux disease)   . Hematuria, unspecified   . Hiatal hernia   . HLD (hyperlipidemia)   . Hyperglycemia   . Insomnia, unspecified   . Lumbosacral spondylosis without myelopathy   . Neurotic excoriations   . Osteoporosis, unspecified   . PAF (paroxysmal atrial fibrillation) ([place])    a. 08/2015 in setting of asp pna-->converted on dilt. Xarelto started (CHA2DS2VASc= 1).  . Personal history of urinary calculi   . Pneumonia   . Prurigo nodularis   . Pulmonary hypertension ([place])    a. 08/2015 Echo: EF 50-55%, no rwma, gr1 DD, mild MR, PASP 46mmHg.   Sonya [name] Pure hypercholesterolemia   . Scoliosis (and kyphoscoliosis), idiopathic   . Squamous cell carcinoma   . Tobacco use disorder   . Unspecified hypothyroidism   . Unspecified vitamin D deficiency   . UTI (urinary tract infection)    frequent    Past Surgical History:  Procedure Laterality Date  . 2 harrington rods in  back  1980  . ABDOMINAL HYSTERECTOMY     endometriosis; ovaries resected; no cancer.  [place] Anes  2006   both feet  . CATARACT EXTRACTION, BILATERAL    . [place]  . COLONOSCOPY WITH PROPOFOL N/A 11/21/2016   Procedure: COLONOSCOPY WITH PROPOFOL;  Surgeon: Manya Silvas, MD;  Location: [facility] ENDOSCOPY;  Service: Endoscopy;  Laterality: N/A;  . ESOPHAGOGASTRODUODENOSCOPY N/A 11/21/2016   Procedure: ESOPHAGOGASTRODUODENOSCOPY (EGD);  Surgeon: Manya Silvas, MD;  Location[facility] ENDOSCOPY;  Service: Endoscopy;  Laterality: N/A;  . EYE SURGERY    . JOINT REPLACEMENT  03/16/12   L TKR; R TKR  . lumbar spine surgery  [place]  . squamous cell carcinoma resection  02/2010   Sonya [name]  . TONSILLECTOMY AND ADENOIDECTOMY  1964  . VAGINAL HYSTERECTOMY  1990    Family History  Problem Relation Age of Onset  . Coronary artery disease Brother        stents    SIster stents also  . Heart disease Brother   . Heart failure Brother        CABG  AMI @ 95  . Heart disease Brother   . Heart failure Mother        pacemaker  . Hypertension Mother   .  Heart disease Mother        CHF; pacemaker  . Heart failure Father        AMI  . Cancer Father 15       colon cancer  . Heart disease Father        mutliple AMIs  . Cancer Maternal Grandmother        ovarian  . Heart disease Sister 75       cardiac stenting  . Heart disease Brother   . Heart disease Brother   . GI problems Unknown        Family history malignancy, GI tract  . Breast cancer Cousin     Social History Social History  Substance Use Topics  . Smoking status: Former Smoker    Packs/day: 1.00    Years: 25.00    Types: Cigarettes    Start date: 11/05/1984    Quit date: 11/06/2006  . Smokeless tobacco: Never Used     Comment: quit 11/07  . Alcohol use No    Allergies  Allergen Reactions  . Latex Other (See Comments)    OPEN SORES  . Levofloxacin   . Sulfonamide  Derivatives     Current Outpatient Prescriptions  Medication Sig Dispense Refill  . albuterol (PROVENTIL HFA;VENTOLIN HFA) 108 (90 Base) MCG/ACT inhaler Inhale 2 puffs into the lungs every 6 (six) hours as needed. 3 Inhaler 1  . baclofen (LIORESAL) 10 MG tablet Take 0.5 tablets (5 mg total) by mouth 2 (two) times daily. 60 tablet 0  . clonazePAM (KLONOPIN) 0.5 MG tablet Take 1 tablet (0.5 mg total) by mouth 2 (two) times daily. 180 tablet 1  . diltiazem (CARDIZEM CD) 240 MG 24 hr capsule Take 1 capsule (240 mg total) by mouth daily. 90 capsule 3  . DULoxetine (CYMBALTA) 60 MG capsule Take 1 capsule (60 mg total) by mouth daily. 90 capsule 1  . fexofenadine (ALLEGRA) 180 MG tablet Take 1 tablet (180 mg total) by mouth daily. 90 tablet 3  . fluticasone (FLONASE) 50 MCG/ACT nasal spray Place 2 sprays into both nostrils daily. 48 g 3  . levothyroxine (SYNTHROID) 50 MCG tablet Take 1 tablet (50 mcg total) by mouth daily before breakfast. 90 tablet 3  . Linaclotide (LINZESS PO) Take by mouth daily.    . montelukast (SINGULAIR) 10 MG tablet Take 10 mg by mouth at bedtime.  11  . OXYGEN Inhale 2 L into the lungs at bedtime.    . pantoprazole (PROTONIX) 40 MG tablet Take 1 tablet (40 mg total) by mouth daily at 12 noon. 90 tablet 3  . pregabalin (LYRICA) 50 MG capsule Take 50 mg by mouth 2 (two) times daily.    . rivaroxaban (XARELTO) 20 MG TABS tablet Take 1 tablet (20 mg total) by mouth daily with supper. 90 tablet 3  . rosuvastatin (CRESTOR) 20 MG tablet Take 1 tablet (20 mg total) by mouth daily. 90 tablet 3  . traMADol (ULTRAM) 50 MG tablet Take 100 mg by mouth every 6 (six) hours as needed.    . tretinoin (RETIN-A) 0.1 % cream Apply a pea sized amount to the face nightly 20 min after washing, avoiding area around the eyes and creases around the nose.    . triamcinolone cream (KENALOG) 0.1 % Apply 1 application topically 2 (two) times daily. 30 g 0  . zolpidem (AMBIEN) 10 MG tablet Take 1 tablet  (10 mg total) by mouth at bedtime as needed for sleep. 90 tablet 1  No current facility-administered medications for this visit.     Review of Systems Review of Systems  Constitutional: Negative.   Respiratory: Negative.   Cardiovascular: Negative.     Blood pressure 120/70, pulse 72, resp. rate 15, height 5\' 2"  (1.575 m), weight 192 lb (87.1 kg).  Physical Exam Physical Exam  Constitutional: She is oriented to person, place, and time. She appears well-developed and well-nourished.  HENT:  Mouth/Throat: Oropharynx is clear and moist.  Eyes: Conjunctivae are normal. No scleral icterus.  Neck: Neck supple.  Cardiovascular: Normal rate, regular rhythm and normal heart sounds.   Pulmonary/Chest: Effort normal and breath sounds normal.  Abdominal: Soft. Normal appearance and bowel sounds are normal. There is no hepatomegaly. There is no tenderness.  Genitourinary: Rectal exam shows internal hemorrhoid and fissure.  Genitourinary Comments: Internal hemorrhoid at 3 o'clock location. Anal fissure a t 12 o'clock location -appears to be chronic with some scarring, no associated spasm of internal sphincter. No bleeding noted  Lymphadenopathy:    She has no cervical adenopathy.  Neurological: She is alert and oriented to person, place, and time.  Skin: Skin is warm and dry.  Psychiatric: She has a normal mood and affect. Her behavior is normal.    Data Reviewed Prior notes reviewed  Assessment    Internal hemorrhoids - she had a colonoscopy and endoscopy about 1 week ago and has a scheduled capsule endoscopy in the next week.   Internal hemorrhoid and anal fissure noted on exam    Plan    Capsule endoscopy is already scheduled for Tuesday 12-02-16. May use preparation H or Anusol(over the counter) to the rectal area twice a day Follow up in 3 weeks. If capsule endoscopy is normal consider banding of the internal hemorrhoid at the 3:00 location   HPI, Physical Exam, Assessment and  Plan have been scribed under the direction and in the presence of Mckinley Jewel, MD Karie Fetch, RN     I have completed the exam and reviewed the above documentation for accuracy and completeness.  I agree with the above.  Haematologist has been used and any errors in dictation or transcription are unintentional.  Evans Levee G. Jamal Collin, M.D., F.A.C.S.    Junie Panning G 11/27/2016, 3:29 PM

## 2016-11-28 NOTE — Telephone Encounter (Signed)
I will look around for the facts form.

## 2016-12-03 ENCOUNTER — Ambulatory Visit (INDEPENDENT_AMBULATORY_CARE_PROVIDER_SITE_OTHER): Payer: BLUE CROSS/BLUE SHIELD | Admitting: Family Medicine

## 2016-12-03 ENCOUNTER — Encounter: Payer: Self-pay | Admitting: Family Medicine

## 2016-12-03 VITALS — BP 128/62 | HR 96 | Temp 98.0°F | Resp 16 | Ht 62.21 in | Wt 191.0 lb

## 2016-12-03 DIAGNOSIS — K219 Gastro-esophageal reflux disease without esophagitis: Secondary | ICD-10-CM

## 2016-12-03 DIAGNOSIS — R195 Other fecal abnormalities: Secondary | ICD-10-CM

## 2016-12-03 DIAGNOSIS — E7800 Pure hypercholesterolemia, unspecified: Secondary | ICD-10-CM

## 2016-12-03 DIAGNOSIS — G894 Chronic pain syndrome: Secondary | ICD-10-CM | POA: Diagnosis not present

## 2016-12-03 DIAGNOSIS — M47816 Spondylosis without myelopathy or radiculopathy, lumbar region: Secondary | ICD-10-CM

## 2016-12-03 DIAGNOSIS — K625 Hemorrhage of anus and rectum: Secondary | ICD-10-CM | POA: Diagnosis not present

## 2016-12-03 DIAGNOSIS — E538 Deficiency of other specified B group vitamins: Secondary | ICD-10-CM | POA: Diagnosis not present

## 2016-12-03 DIAGNOSIS — D500 Iron deficiency anemia secondary to blood loss (chronic): Secondary | ICD-10-CM | POA: Diagnosis not present

## 2016-12-03 DIAGNOSIS — Z23 Encounter for immunization: Secondary | ICD-10-CM

## 2016-12-03 DIAGNOSIS — M47814 Spondylosis without myelopathy or radiculopathy, thoracic region: Secondary | ICD-10-CM | POA: Diagnosis not present

## 2016-12-03 DIAGNOSIS — K5909 Other constipation: Secondary | ICD-10-CM

## 2016-12-03 DIAGNOSIS — E034 Atrophy of thyroid (acquired): Secondary | ICD-10-CM | POA: Diagnosis not present

## 2016-12-03 DIAGNOSIS — E7439 Other disorders of intestinal carbohydrate absorption: Secondary | ICD-10-CM

## 2016-12-03 DIAGNOSIS — Z7901 Long term (current) use of anticoagulants: Secondary | ICD-10-CM

## 2016-12-03 MED ORDER — POTASSIUM CHLORIDE CRYS ER 20 MEQ PO TBCR: 20.0000 meq | EXTENDED_RELEASE_TABLET | Freq: Every day | ORAL | 1 refills | Status: DC

## 2016-12-03 MED ORDER — FERROUS SULFATE 325 (65 FE) MG PO TBEC: 325.0000 mg | DELAYED_RELEASE_TABLET | Freq: Three times a day (TID) | ORAL | 3 refills | Status: DC

## 2016-12-03 MED ORDER — FERROUS SULFATE 325 (65 FE) MG PO TBEC: 325.0000 mg | DELAYED_RELEASE_TABLET | Freq: Three times a day (TID) | ORAL | 3 refills | Status: AC

## 2016-12-03 MED ORDER — CYANOCOBALAMIN 1000 MCG/ML IJ SOLN
1000.0000 ug | INTRAMUSCULAR | Status: DC
  Administered 2016-12-03 – 2017-05-06 (×5): 1000 ug via INTRAMUSCULAR

## 2016-12-03 NOTE — Patient Instructions (Signed)
     IF you received an x-ray today, you will receive an invoice from [facility]. Please contact [facility] at [phone] with questions or concerns regarding your invoice.   IF you received labwork today, you will receive an invoice from [facility]. Please contact [facility] at [phone] with questions or concerns regarding your invoice.   Our billing staff will not be able to assist you with questions regarding bills from these companies.  You will be contacted with the lab results as soon as they are available. The fastest way to get your results is to activate your My Chart account. Instructions are located on the last page of this paperwork. If you have not heard from us regarding the results in 2 weeks, please contact this office.     

## 2016-12-03 NOTE — Progress Notes (Signed)
Subjective:    Patient ID: Sonya [name], female    DOB: [birth year], 66 y.o.   MRN: [id number]  12/03/2016  Hypothyroidism (follow-up ); Gastroesophageal Reflux; and Iron (pt states she would like labs)    HPI This 66 y.o. female presents for four month follow-up of hypothyroidism, hypercholesterolemia, and new onset anemia. Management changes made at last visit included: -new onset cough with congestion with COPD exacerbation; obtain CXR; treat with Zpack; rx for Albuterol HFA provided. If no improvement in 72 hours, call for Prednisone taper.  Start Mucinex DM. -obtain labs for chronic disease management.  -refills provided. -seeing dermatology for neurotic excoriations; responding nicely to phototherapy. -suffering with chronic lower back pain yet has decreased pain control to Tramadol only.  Followed by pain clinic. -suffering with profound fatigue for the past month yet has had acute illness; obtain labs; if fatigue persists, obtain repeat sleep study as patient has gained a lot of weight in the past few years.  -New insect bites; rx for triamcinolone cream for bug bites on neck.  Lab results from last visit included the following management: -Anemia is stable with a hemoglobin of 10.4; we will continue to monitor closely at each visit. When was your last colonoscopy? Who performed it? I would recommend completing stool cards to rule out bleeding from your GI tract. I will mail the cards to you for completion. -Sugar/glucose is normal. Hemoglobin A1c is slightly elevated at 5.8 which is consistent with a Wann. I recommend weight loss, exercise, and low-carbohydrate low-sugar food choices. You should AVOID: regular sodas, sweetened tea, fruit juices. You should LIMIT: breads, pastas, rice, potatoes, and desserts/sweets. I would recommend limiting your total carbohydrate intake per meal to 45 grams; I would limit your total carbohydrate intake per snack to 30  grams. I would also have a goal of 60 grams of protein intake per day; this would equal 10-15 grams of protein per meal and 5-10 grams of protein per snack. -Liver and kidney functions are normal. -Cholesterol is under good control. -Thyroid function is normal. -Vitamin B12 level is borderline low at 312.  I would recommend taking a Centrum Silver multivitamin one daily. -Vitamin D is normal.  Chronic pain syndrome: Saw pain specialist this morning; pain not improved with stimulator; not helping pain; wants removed.  Will schedule removal soon.   New onset anemia: -Still very tired.    -s/p GI consultation since last visit with repeat labs that had worsened; Hemoglobin 9.5 on 11/18/2016.  Hgb 11/11/16 9.1.  Feels like has a corncob in buttocks constantly; onset AFTER last visit with PCP.  Made an appointment with Dr. Vira Agar due to rectal symptoms and need for repeat colonoscopy.  S/p colonoscopy and EGD last month.  No source of bleeding on either exam.  NP performed hemosure with rectal exam; +positive.  Small hemorrhoids external non-bleeding; several internal hemorrhoids; also anal fissure.  Saw Dr. Jamal Collin for surgical consultation for bleeding hemorrhoids.  Performed rectal exam and anoscope.  No bleeding identified.  Scheduled for capsular endoscopy due to worsening anemia.  Dr. Vira Agar suggested that patient may need iv iron; has not been started on oral iron.   Started Silver [employer] daily.  Not taking iron supplement.   Has lost 15 pounds with rectal bleeding; yesterday had profuse rectal bleeding with a bowel movement.  Not having much pain with bowel movements.    Having horrible burning epigastric; usually must take Zantac towards evening.    BP Readings  from Last 3 Encounters:  12/03/16 128/62  11/27/16 120/70  11/21/16 (!) 94/52   Wt Readings from Last 3 Encounters:  12/03/16 191 lb (86.6 kg)  11/27/16 192 lb (87.1 kg)  11/21/16 195 lb (88.5 kg)   Immunization History    Administered Date(s) Administered  . Influenza Split 11/10/2011  . Influenza,inj,Quad PF,6+ Mos 12/20/2012, 10/26/2013, 12/01/2014, 01/02/2016, 12/03/2016  . Influenza-Unspecified 11/14/2009, 12/25/2010  . Pneumococcal Polysaccharide-23 01/02/2016  . Pneumococcal-Unspecified 12/18/2009  . Tdap 12/25/2010  . Zoster 02/24/2014    Review of Systems  Constitutional: Positive for fatigue. Negative for chills, diaphoresis and fever.  Eyes: Negative for visual disturbance.  Respiratory: Negative for cough and shortness of breath.   Cardiovascular: Negative for chest pain, palpitations and leg swelling.  Gastrointestinal: Positive for anal bleeding and rectal pain. Negative for abdominal distention, abdominal pain, blood in stool, constipation, diarrhea, nausea and vomiting.  Endocrine: Negative for cold intolerance, heat intolerance, polydipsia, polyphagia and polyuria.  Musculoskeletal: Positive for arthralgias and back pain.  Neurological: Negative for dizziness, tremors, seizures, syncope, facial asymmetry, speech difficulty, weakness, light-headedness, numbness and headaches.    Past Medical History:  Diagnosis Date  . Allergic rhinitis, cause unspecified   . Anemia   . Anxiety   . Arthritis   . Benign neoplasm of colon   . Bruising   . Chest pain    a. 2015 Neg MV.  . Chronic pain syndrome   . Degeneration of lumbar or lumbosacral intervertebral disc   . Depressive disorder, not elsewhere classified   . Edema   . Emphysema   . GERD (gastroesophageal reflux disease)   . Hematuria, unspecified   . Hiatal hernia   . HLD (hyperlipidemia)   . Hyperglycemia   . Insomnia, unspecified   . Lumbosacral spondylosis without myelopathy   . Neurotic excoriations   . Osteoporosis, unspecified   . PAF (paroxysmal atrial fibrillation) ([place])    a. 08/2015 in setting of asp pna-->converted on dilt. Xarelto started (CHA2DS2VASc= 1).  . Personal history of urinary calculi   . Pneumonia   .  Prurigo nodularis   . Pulmonary hypertension ([place])    a. 08/2015 Echo: EF 50-55%, no rwma, gr1 DD, mild MR, PASP 79mmHg.   Marland Kitchen Pure hypercholesterolemia   . Scoliosis (and kyphoscoliosis), idiopathic   . Squamous cell carcinoma   . Tobacco use disorder   . Unspecified hypothyroidism   . Unspecified vitamin D deficiency   . UTI (urinary tract infection)    frequent   Past Surgical History:  Procedure Laterality Date  . 2 harrington rods in back  1980  . ABDOMINAL HYSTERECTOMY     endometriosis; ovaries resected; no cancer.  Lillard Anes  2006   both feet  . CATARACT EXTRACTION, BILATERAL    . [place]  . COLONOSCOPY WITH PROPOFOL N/A 11/21/2016   Procedure: COLONOSCOPY WITH PROPOFOL;  Surgeon: Manya Silvas, MD;  Location: [facility] ENDOSCOPY;  Service: Endoscopy;  Laterality: N/A;  . ESOPHAGOGASTRODUODENOSCOPY N/A 11/21/2016   Procedure: ESOPHAGOGASTRODUODENOSCOPY (EGD);  Surgeon: Manya Silvas, MD;  Location: [facility] ENDOSCOPY;  Service: Endoscopy;  Laterality: N/A;  . EYE SURGERY    . JOINT REPLACEMENT  03/16/12   L TKR; R TKR  . lumbar spine surgery  [place]  . squamous cell carcinoma resection  02/2010   [name]  . TONSILLECTOMY AND ADENOIDECTOMY  1964  . VAGINAL HYSTERECTOMY  1990   Allergies  Allergen Reactions  .  Latex Other (See Comments)    OPEN SORES  . Levofloxacin   . Sulfonamide Derivatives    Current Outpatient Prescriptions on File Prior to Visit  Medication Sig Dispense Refill  . albuterol (PROVENTIL HFA;VENTOLIN HFA) 108 (90 Base) MCG/ACT inhaler Inhale 2 puffs into the lungs every 6 (six) hours as needed. 3 Inhaler 1  . baclofen (LIORESAL) 10 MG tablet Take 0.5 tablets (5 mg total) by mouth 2 (two) times daily. 60 tablet 0  . clonazePAM (KLONOPIN) 0.5 MG tablet Take 1 tablet (0.5 mg total) by mouth 2 (two) times daily. 180 tablet 1  . diltiazem (CARDIZEM CD) 240 MG 24 hr capsule Take 1 capsule (240 mg total) by  mouth daily. 90 capsule 3  . DULoxetine (CYMBALTA) 60 MG capsule Take 1 capsule (60 mg total) by mouth daily. 90 capsule 1  . fexofenadine (ALLEGRA) 180 MG tablet Take 1 tablet (180 mg total) by mouth daily. 90 tablet 3  . fluticasone (FLONASE) 50 MCG/ACT nasal spray Place 2 sprays into both nostrils daily. 48 g 3  . levothyroxine (SYNTHROID) 50 MCG tablet Take 1 tablet (50 mcg total) by mouth daily before breakfast. 90 tablet 3  . Linaclotide (LINZESS PO) Take by mouth daily.    . montelukast (SINGULAIR) 10 MG tablet Take 10 mg by mouth at bedtime.  11  . OXYGEN Inhale 2 L into the lungs at bedtime.    . pantoprazole (PROTONIX) 40 MG tablet Take 1 tablet (40 mg total) by mouth daily at 12 noon. 90 tablet 3  . pregabalin (LYRICA) 50 MG capsule Take 50 mg by mouth 2 (two) times daily.    . rivaroxaban (XARELTO) 20 MG TABS tablet Take 1 tablet (20 mg total) by mouth daily with supper. 90 tablet 3  . rosuvastatin (CRESTOR) 20 MG tablet Take 1 tablet (20 mg total) by mouth daily. 90 tablet 3  . traMADol (ULTRAM) 50 MG tablet Take 100 mg by mouth every 6 (six) hours as needed.    . tretinoin (RETIN-A) 0.1 % cream Apply a pea sized amount to the face nightly 20 min after washing, avoiding area around the eyes and creases around the nose.    . triamcinolone cream (KENALOG) 0.1 % Apply 1 application topically 2 (two) times daily. 30 g 0  . zolpidem (AMBIEN) 10 MG tablet Take 1 tablet (10 mg total) by mouth at bedtime as needed for sleep. 90 tablet 1   No current facility-administered medications on file prior to visit.    Social History   Social History  . Marital status: Married    Spouse name: Lyndsay Talamante  . Number of children: 1  . Years of education: 54   Occupational History  . disabled     77   DDD   Social History Main Topics  . Smoking status: Former Smoker    Packs/day: 1.00    Years: 25.00    Types: Cigarettes    Start date: 11/05/1984    Quit date: 11/06/2006  . Smokeless  tobacco: Never Used     Comment: quit 11/07  . Alcohol use No  . Drug use: No  . Sexual activity: Yes    Partners: Male   Other Topics Concern  . Not on file   Social History Narrative   Marital status:  Married since 1992; second marriage; happily married; no abuse.      Children:  1 daughter; 3 stepchildren; 8 grandchildren; 2 gg.   Daughter in [place].  Husband's children in [place].      Employment:  Retired/disabled due to DDD lumbar.       Lives:   Lives with spouse;      Caffeine use cosumes a moderate amount 1 serving per day.      Always uses seat belts.       No guns in home.       Smoke alarm and carbon monoxide detectors in the home.       No Living Will.   Family History  Problem Relation Age of Onset  . Coronary artery disease Brother        stents    SIster stents also  . Heart disease Brother   . Heart failure Brother        CABG  AMI @ 82  . Heart disease Brother   . Heart failure Mother        pacemaker  . Hypertension Mother   . Heart disease Mother        CHF; pacemaker  . Heart failure Father        AMI  . Cancer Father 56       colon cancer  . Heart disease Father        mutliple AMIs  . Cancer Maternal Grandmother        ovarian  . Heart disease Sister 14       cardiac stenting  . Heart disease Brother   . Heart disease Brother   . GI problems Unknown        Family history malignancy, GI tract  . Breast cancer Cousin        Objective:    BP 128/62   Pulse 96   Temp 98 F (36.7 C) (Oral)   Resp 16   Ht 5' 2.21" (1.58 m)   Wt 191 lb (86.6 kg)   SpO2 95%   BMI 34.70 kg/m  Physical Exam  Constitutional: She is oriented to person, place, and time. She appears well-developed and well-nourished. No distress.  HENT:  Head: Normocephalic and atraumatic.  Eyes: Pupils are equal, round, and reactive to light. Conjunctivae are normal.  Neck: Normal range of motion. Neck supple.  Cardiovascular: Normal rate, regular rhythm and normal heart  sounds.  Exam reveals no gallop and no friction rub.   No murmur heard. Pulmonary/Chest: Effort normal and breath sounds normal. She has no wheezes. She has no rales.  Neurological: She is alert and oriented to person, place, and time.  Skin: She is not diaphoretic.  Psychiatric: She has a normal mood and affect. Her behavior is normal.  Nursing note and vitals reviewed.  No results found. Depression screen [facility] 2/9 12/03/2016 07/30/2016 04/08/2016 01/02/2016 10/02/2015  Decreased Interest 0 0 0 0 0  Down, Depressed, Hopeless 0 0 0 0 0  PHQ - 2 Score 0 0 0 0 0   Fall Risk  12/03/2016 07/30/2016 04/08/2016 01/02/2016 10/02/2015  Falls in the past year? No No No No Yes  Number falls in past yr: - - - - 2 or more  Injury with Fall? - - - - Yes  Comment - - - - -        Assessment & Plan:   1. Iron deficiency anemia due to chronic blood loss   2. Vitamin B12 deficiency   3. Hypothyroidism due to acquired atrophy of thyroid   4. Need for influenza vaccination   5. Glucose intolerance   6. Pure hypercholesterolemia  7. Spondylosis of lumbar region without myelopathy or radiculopathy   8. Spondylosis of thoracic region without myelopathy or radiculopathy   9. Chronic pain syndrome   10. Constipation, chronic   11. Gastroesophageal reflux disease without esophagitis   12. Rectal bleeding   13. Chronic anticoagulation   14. Occult blood positive stool    -worsening anemia iron deficiency since last visit; s/p EGD and colonoscopy due to rectal bleeding and rectal pain; repeat labs today; initiate oral ferrous sulfate 325mg  bid.  Increase stool softener with iron supplementation. -new onset rectal bleeding and pain; s/p GI consultation; s/p colonoscopy that revealed hemorrhoids bleeding; s/p general surgery consultation/Sankar for hemorrhoidal issues. -obtain labs for chronic disease management. -s/p B12 injection for borderline B12 level at last visit; repeat today.   -worsening GERD;  warranting PPI and H2 blocker most days.  -prolonged face-to-face for 40 minutes with greater than 50% of time dedicated to counseling and coordination of care.   Orders Placed This Encounter  Procedures  . Flu Vaccine QUAD 36+ mos IM  . CBC with Differential/Platelet  . Comprehensive metabolic panel    Order Specific Question:   Has the patient fasted?    Answer:   No  . Lipid panel    Order Specific Question:   Has the patient fasted?    Answer:   No  . TSH  . Vitamin B12  . Iron and TIBC   Meds ordered this encounter  Medications  . DISCONTD: ferrous sulfate 325 (65 FE) MG EC tablet    Sig: Take 1 tablet (325 mg total) by mouth 3 (three) times daily with meals.    Dispense:  90 tablet    Refill:  3  . ferrous sulfate 325 (65 FE) MG EC tablet    Sig: Take 1 tablet (325 mg total) by mouth 3 (three) times daily with meals.    Dispense:  90 tablet    Refill:  3  . cyanocobalamin ((VITAMIN B-12)) injection 1,000 mcg  . potassium chloride SA (K-DUR,KLOR-CON) 20 MEQ tablet    Sig: Take 1 tablet (20 mEq total) by mouth daily.    Dispense:  90 tablet    Refill:  1    Return in about 4 weeks (around 12/31/2016) for recheck.   Kristi Elayne Guerin, M.D. Primary Care at [facility] previously [facility] [place] [address] [place], [place]  [phone] phone [phone] fax

## 2016-12-04 LAB — IRON AND TIBC
IRON SATURATION: 6 % — AB (ref 15–55)
Iron: 32 ug/dL (ref 27–139)
TIBC: 536 ug/dL — AB (ref 250–450)
UIBC: 504 ug/dL — ABNORMAL HIGH (ref 118–369)

## 2016-12-04 LAB — LIPID PANEL
CHOL/HDL RATIO: 3 ratio (ref 0.0–4.4)
Cholesterol, Total: 142 mg/dL (ref 100–199)
HDL: 48 mg/dL (ref >39)
LDL CALC: 78 mg/dL (ref 0–99)
Triglycerides: 82 mg/dL (ref 0–149)
VLDL CHOLESTEROL CAL: 16 mg/dL (ref 5–40)

## 2016-12-04 LAB — CBC WITH DIFFERENTIAL/PLATELET
BASOS: 0 %
Basophils Absolute: 0 10*3/uL (ref 0.0–0.2)
EOS (ABSOLUTE): 0.1 10*3/uL (ref 0.0–0.4)
EOS: 2 %
HEMATOCRIT: 32.2 % — AB (ref 34.0–46.6)
HEMOGLOBIN: 9.1 g/dL — AB (ref 11.1–15.9)
IMMATURE GRANS (ABS): 0 10*3/uL (ref 0.0–0.1)
Immature Granulocytes: 0 %
LYMPHS ABS: 1.9 10*3/uL (ref 0.7–3.1)
LYMPHS: 28 %
MCH: 20.1 pg — AB (ref 26.6–33.0)
MCHC: 28.3 g/dL — AB (ref 31.5–35.7)
MCV: 71 fL — ABNORMAL LOW (ref 79–97)
MONOCYTES: 8 %
Monocytes Absolute: 0.5 10*3/uL (ref 0.1–0.9)
NEUTROS ABS: 4.3 10*3/uL (ref 1.4–7.0)
Neutrophils: 62 %
Platelets: 319 10*3/uL (ref 150–379)
RBC: 4.52 x10E6/uL (ref 3.77–5.28)
RDW: 18.2 % — ABNORMAL HIGH (ref 12.3–15.4)
WBC: 6.9 10*3/uL (ref 3.4–10.8)

## 2016-12-04 LAB — COMPREHENSIVE METABOLIC PANEL
ALBUMIN: 4.3 g/dL (ref 3.6–4.8)
ALK PHOS: 91 IU/L (ref 39–117)
ALT: 16 IU/L (ref 0–32)
AST: 25 IU/L (ref 0–40)
Albumin/Globulin Ratio: 2 (ref 1.2–2.2)
BILIRUBIN TOTAL: 0.5 mg/dL (ref 0.0–1.2)
BUN/Creatinine Ratio: 9 — ABNORMAL LOW (ref 12–28)
BUN: 7 mg/dL — AB (ref 8–27)
CO2: 22 mmol/L (ref 20–29)
CREATININE: 0.77 mg/dL (ref 0.57–1.00)
Calcium: 8.9 mg/dL (ref 8.7–10.3)
Chloride: 104 mmol/L (ref 96–106)
GFR calc Af Amer: 94 mL/min/{1.73_m2} (ref >59)
GFR, EST NON AFRICAN AMERICAN: 82 mL/min/{1.73_m2} (ref >59)
GLOBULIN, TOTAL: 2.2 g/dL (ref 1.5–4.5)
Glucose: 84 mg/dL (ref 65–99)
Potassium: 4.4 mmol/L (ref 3.5–5.2)
SODIUM: 139 mmol/L (ref 134–144)
TOTAL PROTEIN: 6.5 g/dL (ref 6.0–8.5)

## 2016-12-04 LAB — TSH: TSH: 2.07 u[IU]/mL (ref 0.450–4.500)

## 2016-12-04 LAB — VITAMIN B12: Vitamin B-12: 348 pg/mL (ref 232–1245)

## 2016-12-09 ENCOUNTER — Encounter: Payer: Self-pay | Admitting: Family Medicine

## 2016-12-09 ENCOUNTER — Encounter: Payer: Self-pay | Admitting: Psychiatry

## 2016-12-09 ENCOUNTER — Ambulatory Visit (INDEPENDENT_AMBULATORY_CARE_PROVIDER_SITE_OTHER): Payer: BLUE CROSS/BLUE SHIELD | Admitting: Psychiatry

## 2016-12-09 VITALS — BP 137/86 | HR 94 | Temp 98.2°F | Wt 190.6 lb

## 2016-12-09 DIAGNOSIS — F331 Major depressive disorder, recurrent, moderate: Secondary | ICD-10-CM | POA: Diagnosis not present

## 2016-12-09 DIAGNOSIS — F411 Generalized anxiety disorder: Secondary | ICD-10-CM | POA: Diagnosis not present

## 2016-12-09 MED ORDER — CLONAZEPAM 0.5 MG PO TABS: 0.5000 mg | ORAL_TABLET | Freq: Three times a day (TID) | ORAL | 1 refills | Status: DC | PRN

## 2016-12-09 MED ORDER — ZOLPIDEM TARTRATE ER 12.5 MG PO TBCR: 12.5000 mg | EXTENDED_RELEASE_TABLET | Freq: Every evening | ORAL | 1 refills | Status: AC | PRN

## 2016-12-09 MED ORDER — DULOXETINE HCL 60 MG PO CPEP: 60.0000 mg | ORAL_CAPSULE | Freq: Every day | ORAL | 1 refills | Status: DC

## 2016-12-09 NOTE — Progress Notes (Signed)
Follow-up 66 year old woman with depression and anxiety. Continues to have a lot of stress especially related to medical problems. Mood is not necessarily more depressed however. She does feel more nervous much of the time especially during the day. Also continues to have difficulty sleeping even with the 10 mg of Ambien. Denies suicidal thoughts. Does not appear to be confused or delirious.  Neatly dressed and groomed. Good eye contact. Normal psychomotor activity. Speech normal rate tone and volume. Affect slightly flat thoughts lucid no suicidal or homicidal ideation. Cognitively intact good judgment and insight.  I agreed to allow the patient to increase her clonazepam to a half milligram 3 times a day. Still a lowish dose. Also I agreed to try changing her Ambien to the extended release 12.5 mg although I told her there is a chance that this might not be paid for. I will [facility] while continuing her duloxetine. Supportive counseling. Review of plan. Follow-up 6 months.

## 2016-12-12 ENCOUNTER — Telehealth: Payer: Self-pay

## 2016-12-12 NOTE — Telephone Encounter (Signed)
pt called stated that she called her pharmacy today and they dont have the rxs for the klonopin or the [place]. pt states that both need to [facility]

## 2016-12-12 NOTE — Telephone Encounter (Signed)
I faxed [facility] in to [facility]

## 2016-12-17 ENCOUNTER — Ambulatory Visit (INDEPENDENT_AMBULATORY_CARE_PROVIDER_SITE_OTHER): Payer: BLUE CROSS/BLUE SHIELD | Admitting: General Surgery

## 2016-12-17 ENCOUNTER — Encounter: Payer: Self-pay | Admitting: General Surgery

## 2016-12-17 VITALS — BP 130/84 | HR 82 | Resp 12 | Ht 65.0 in | Wt 190.0 lb

## 2016-12-17 DIAGNOSIS — K648 Other hemorrhoids: Secondary | ICD-10-CM | POA: Diagnosis not present

## 2016-12-17 NOTE — Patient Instructions (Signed)
Patient to return as needed. The patient is aware to call back for any questions or concerns. 

## 2016-12-17 NOTE — Progress Notes (Signed)
Patient ID: Sonya [name], female   DOB: [birth year], 66 y.o.   MRN: [id number]  Chief Complaint  Patient presents with  . Follow-up    HPI Sonya [name] is a 66 y.o. female here today for her three week follow up rectal bleeding . Patient states she is not bleeding anymore. Admits some constipation that began with iron supplementation. Is using Linzess which helps most of the time.  HPI  Past Medical History:  Diagnosis Date  . Allergic rhinitis, cause unspecified   . Anemia   . Anxiety   . Arthritis   . Benign neoplasm of colon   . Bruising   . Chest pain    a. 2015 Neg MV.  . Chronic pain syndrome   . Degeneration of lumbar or lumbosacral intervertebral disc   . Depressive disorder, not elsewhere classified   . Edema   . Emphysema   . GERD (gastroesophageal reflux disease)   . Hematuria, unspecified   . Hiatal hernia   . HLD (hyperlipidemia)   . Hyperglycemia   . Insomnia, unspecified   . Lumbosacral spondylosis without myelopathy   . Neurotic excoriations   . Osteoporosis, unspecified   . PAF (paroxysmal atrial fibrillation) ([place])    a. 08/2015 in setting of asp pna-->converted on dilt. Xarelto started (CHA2DS2VASc= 1).  . Personal history of urinary calculi   . Pneumonia   . Prurigo nodularis   . Pulmonary hypertension ([place])    a. 08/2015 Echo: EF 50-55%, no rwma, gr1 DD, mild MR, PASP 46mmHg.   Marland Kitchen Pure hypercholesterolemia   . Scoliosis (and kyphoscoliosis), idiopathic   . Squamous cell carcinoma   . Tobacco use disorder   . Unspecified hypothyroidism   . Unspecified vitamin D deficiency   . UTI (urinary tract infection)    frequent    Past Surgical History:  Procedure Laterality Date  . 2 harrington rods in back  1980  . ABDOMINAL HYSTERECTOMY     endometriosis; ovaries resected; no cancer.  Lillard Anes  2006   both feet  . CATARACT EXTRACTION, BILATERAL    . [place]  . COLONOSCOPY WITH PROPOFOL N/A  11/21/2016   Procedure: COLONOSCOPY WITH PROPOFOL;  Surgeon: Manya Silvas, MD;  Location: [facility] ENDOSCOPY;  Service: Endoscopy;  Laterality: N/A;  . ESOPHAGOGASTRODUODENOSCOPY N/A 11/21/2016   Procedure: ESOPHAGOGASTRODUODENOSCOPY (EGD);  Surgeon: Manya Silvas, MD;  Location: [facility] ENDOSCOPY;  Service: Endoscopy;  Laterality: N/A;  . EYE SURGERY    . JOINT REPLACEMENT  03/16/12   L TKR; R TKR  . lumbar spine surgery  [place]  . squamous cell carcinoma resection  02/2010   [name]  . TONSILLECTOMY AND ADENOIDECTOMY  1964  . VAGINAL HYSTERECTOMY  1990    Family History  Problem Relation Age of Onset  . Coronary artery disease Brother        stents    SIster stents also  . Heart disease Brother   . Heart failure Brother        CABG  AMI @ 14  . Heart disease Brother   . Heart failure Mother        pacemaker  . Hypertension Mother   . Heart disease Mother        CHF; pacemaker  . Heart failure Father        AMI  . Cancer Father 43       colon cancer  . Heart disease Father  mutliple AMIs  . Cancer Maternal Grandmother        ovarian  . Heart disease Sister 53       cardiac stenting  . Heart disease Brother   . Heart disease Brother   . GI problems Unknown        Family history malignancy, GI tract  . Breast cancer Cousin     Social History Social History  Substance Use Topics  . Smoking status: Former Smoker    Packs/day: 1.00    Years: 25.00    Types: Cigarettes    Start date: 11/05/1984    Quit date: 11/06/2006  . Smokeless tobacco: Never Used     Comment: quit 11/07  . Alcohol use No    Allergies  Allergen Reactions  . Latex Other (See Comments)    OPEN SORES  . Levofloxacin   . Sulfonamide Derivatives     Current Outpatient Prescriptions  Medication Sig Dispense Refill  . albuterol (PROVENTIL HFA;VENTOLIN HFA) 108 (90 Base) MCG/ACT inhaler Inhale 2 puffs into the lungs every 6 (six) hours as needed. 3 Inhaler 1  . baclofen  (LIORESAL) 10 MG tablet Take 0.5 tablets (5 mg total) by mouth 2 (two) times daily. 60 tablet 0  . clonazePAM (KLONOPIN) 0.5 MG tablet Take 1 tablet (0.5 mg total) by mouth 3 (three) times daily as needed for anxiety. 270 tablet 1  . diltiazem (CARDIZEM CD) 240 MG 24 hr capsule Take 1 capsule (240 mg total) by mouth daily. 90 capsule 3  . DULoxetine (CYMBALTA) 60 MG capsule Take 1 capsule (60 mg total) by mouth daily. 90 capsule 1  . ferrous sulfate 325 (65 FE) MG EC tablet Take 1 tablet (325 mg total) by mouth 3 (three) times daily with meals. 90 tablet 3  . fexofenadine (ALLEGRA) 180 MG tablet Take 1 tablet (180 mg total) by mouth daily. 90 tablet 3  . fluticasone (FLONASE) 50 MCG/ACT nasal spray Place 2 sprays into both nostrils daily. 48 g 3  . levothyroxine (SYNTHROID) 50 MCG tablet Take 1 tablet (50 mcg total) by mouth daily before breakfast. 90 tablet 3  . Linaclotide (LINZESS PO) Take by mouth daily.    . montelukast (SINGULAIR) 10 MG tablet Take 10 mg by mouth at bedtime.  11  . OXYGEN Inhale 2 L into the lungs at bedtime.    . pantoprazole (PROTONIX) 40 MG tablet Take 1 tablet (40 mg total) by mouth daily at 12 noon. 90 tablet 3  . potassium chloride SA (K-DUR,KLOR-CON) 20 MEQ tablet Take 1 tablet (20 mEq total) by mouth daily. 90 tablet 1  . pregabalin (LYRICA) 50 MG capsule Take 50 mg by mouth 2 (two) times daily.    . rivaroxaban (XARELTO) 20 MG TABS tablet Take 1 tablet (20 mg total) by mouth daily with supper. 90 tablet 3  . rosuvastatin (CRESTOR) 20 MG tablet Take 1 tablet (20 mg total) by mouth daily. 90 tablet 3  . traMADol (ULTRAM) 50 MG tablet Take 100 mg by mouth every 6 (six) hours as needed.    . tretinoin (RETIN-A) 0.1 % cream Apply a pea sized amount to the face nightly 20 min after washing, avoiding area around the eyes and creases around the nose.    . triamcinolone cream (KENALOG) 0.1 % Apply 1 application topically 2 (two) times daily. 30 g 0  . zolpidem (AMBIEN CR)  12.5 MG CR tablet Take 1 tablet (12.5 mg total) by mouth at bedtime as needed for  sleep. 90 tablet 1   Current Facility-Administered Medications  Medication Dose Route Frequency Provider Last Rate Last Dose  . cyanocobalamin ((VITAMIN B-12)) injection 1,000 mcg  1,000 mcg Intramuscular Q30 days Wardell Honour, MD   1,000 mcg at 12/03/16 1318    Review of Systems Review of Systems  Constitutional: Negative.   Respiratory: Negative.   Cardiovascular: Negative.     Blood pressure 130/84, pulse 82, resp. rate 12, height 5\' 5"  (1.651 m), weight 190 lb (86.2 kg).  Physical Exam Physical Exam  Constitutional: She is oriented to person, place, and time. She appears well-developed and well-nourished.  Genitourinary: Rectal exam shows internal hemorrhoid.     Genitourinary Comments: No anal fissure appreciated  Neurological: She is alert and oriented to person, place, and time.  Skin: Skin is warm and dry.    Data Reviewed Prior notes reviewed   Assessment    Internal hemorrhoid: 3 ocl, non-bleeding, smaller since last visit. Symptoms have significantly improved. Previous anal fissure resolved from last visit.    Plan    Surgical correction not indicated at this time as symptoms significanlty improved. Can continue using anusol prn.  Patient to return as needed. The patient is aware to call back for any questions or concerns.     HPI, Physical Exam, Assessment and Plan have been scribed under the direction and in the presence of Mckinley Jewel, MD  Gaspar Cola, CMA  I have completed the exam and reviewed the above documentation for accuracy and completeness.  I agree with the above.  Haematologist has been used and any errors in dictation or transcription are unintentional.  Seeplaputhur G. Jamal Collin, M.D., F.A.C.S.   Junie Panning G 12/17/2016, 3:14 PM

## 2016-12-18 ENCOUNTER — Telehealth: Payer: Self-pay | Admitting: Cardiovascular Disease

## 2016-12-18 NOTE — Telephone Encounter (Signed)
Received request from [facility] to stop blood thinner for scheduled Spinal cord stimulator implantation with Dr. Vira Blanco. Patient is on Xarelto therapy and will route to Dr. Rockey Situ for review. Route to Fax # [phone] to attention [place].

## 2016-12-21 NOTE — Telephone Encounter (Signed)
Ok to stop xarelto 3 days prior to procedure Would wait on restarting xarelto until surgery says it is ok At least 24 hours if not more

## 2016-12-22 NOTE — Telephone Encounter (Signed)
Encounter faxed via EPIC to [place].

## 2016-12-31 ENCOUNTER — Ambulatory Visit (INDEPENDENT_AMBULATORY_CARE_PROVIDER_SITE_OTHER): Payer: Medicare Other | Admitting: Family Medicine

## 2016-12-31 ENCOUNTER — Other Ambulatory Visit: Payer: Self-pay

## 2016-12-31 ENCOUNTER — Encounter: Payer: Self-pay | Admitting: Family Medicine

## 2016-12-31 VITALS — BP 116/68 | HR 103 | Temp 98.6°F | Resp 16 | Ht 61.02 in | Wt 185.0 lb

## 2016-12-31 DIAGNOSIS — G894 Chronic pain syndrome: Secondary | ICD-10-CM

## 2016-12-31 DIAGNOSIS — F419 Anxiety disorder, unspecified: Secondary | ICD-10-CM

## 2016-12-31 DIAGNOSIS — M47816 Spondylosis without myelopathy or radiculopathy, lumbar region: Secondary | ICD-10-CM | POA: Diagnosis not present

## 2016-12-31 DIAGNOSIS — F329 Major depressive disorder, single episode, unspecified: Secondary | ICD-10-CM

## 2016-12-31 DIAGNOSIS — M47814 Spondylosis without myelopathy or radiculopathy, thoracic region: Secondary | ICD-10-CM | POA: Diagnosis not present

## 2016-12-31 DIAGNOSIS — K219 Gastro-esophageal reflux disease without esophagitis: Secondary | ICD-10-CM | POA: Diagnosis not present

## 2016-12-31 DIAGNOSIS — D500 Iron deficiency anemia secondary to blood loss (chronic): Secondary | ICD-10-CM

## 2016-12-31 DIAGNOSIS — K625 Hemorrhage of anus and rectum: Secondary | ICD-10-CM | POA: Diagnosis not present

## 2016-12-31 DIAGNOSIS — F32A Depression, unspecified: Secondary | ICD-10-CM

## 2016-12-31 DIAGNOSIS — E538 Deficiency of other specified B group vitamins: Secondary | ICD-10-CM | POA: Diagnosis not present

## 2016-12-31 DIAGNOSIS — I4891 Unspecified atrial fibrillation: Secondary | ICD-10-CM

## 2016-12-31 LAB — POCT CBC
GRANULOCYTE PERCENT: 69.8 % (ref 37–80)
HCT, POC: 38.2 % (ref 37.7–47.9)
Hemoglobin: 12.2 g/dL (ref 12.2–16.2)
Lymph, poc: 2 (ref 0.6–3.4)
MCH: 23.3 pg — AB (ref 27–31.2)
MCHC: 31.9 g/dL (ref 31.8–35.4)
MCV: 73.1 fL — AB (ref 80–97)
MID (CBC): 0.6 (ref 0–0.9)
MPV: 6.6 fL (ref 0–99.8)
PLATELET COUNT, POC: 355 10*3/uL (ref 142–424)
POC Granulocyte: 6.1 (ref 2–6.9)
POC LYMPH PERCENT: 23.5 %L (ref 10–50)
POC MID %: 6.7 % (ref 0–12)
RBC: 5.23 M/uL (ref 4.04–5.48)
RDW, POC: 27.8 %
WBC: 8.7 10*3/uL (ref 4.6–10.2)

## 2016-12-31 NOTE — Patient Instructions (Addendum)
  Please call your psychiatrist for a recommendation of a therapist.    IF you received an x-ray today, you will receive an invoice from [facility]. Please contact [facility] at [phone] with questions or concerns regarding your invoice.   IF you received labwork today, you will receive an invoice from [place]. Please contact [facility] at [phone] with questions or concerns regarding your invoice.   Our billing staff will not be able to assist you with questions regarding bills from these companies.  You will be contacted with the lab results as soon as they are available. The fastest way to get your results is to activate your My Chart account. Instructions are located on the last page of this paperwork. If you have not heard from [place] regarding the results in 2 weeks, please contact this office.

## 2016-12-31 NOTE — Progress Notes (Signed)
Subjective:    Patient ID: Sonya [name], female    DOB: Feb 05, 1951, 66 y.o.   MRN: [id number]  12/31/2016  Anemia (1 month follow-up )    HPI This 66 y.o. female presents for [place] for iron deficiency anemia, vitamin B12 deficiency. Really sad and depression and tired.  Seeing psychiatrist.  Knows what must do; husband said he would evict patient. Husband was screaming; must leave.  Medicare is confusing.  Gets letter than taking extended disability away. Will receive $940 per month for SS.  Pension is $600.   Cannot eat or sleep.  Has medications but trying to get off everything. Taking ferrous sulfate 325mg  two daily and sometimes three daily; did have to decrease to two daily.  Having a very hard time with constipation. Still feels like has corncob in rectal area.  Has also seen the surgeon; capsule endoscopy.  Surgeon recommended hemorrhoid cream; returned to see surgeon.  Capsular colonoscopy.  No follow-up iwht GI.  Had an appointment for Monday but not sure why returning.  Physician called after endscopy, return if problems recur.  No polyps.  Surgeon did not recommend any hemorrhoid surgery.  Fissure is healing.   Internal hemorrhoids present.  Horrible pain with bowel movements has improved.  Linzess is working well.  121mcg daily Linzess.  Gave samples of Movantik.  On 01/22/17, they are removing the implant in upper back; not efffective.    Plans to leave December 8,2018 and return beginning of year.  Management changes made at last visit included: -worsening anemia iron deficiency since last visit; s/p EGD and colonoscopy due to rectal bleeding and rectal pain; repeat labs today; initiate oral ferrous sulfate 325mg  bid.  Increase stool softener with iron supplementation. -new onset rectal bleeding and pain; s/p GI consultation; s/p colonoscopy that revealed hemorrhoids bleeding; s/p general surgery consultation/Sankar for hemorrhoidal issues. -obtain labs for  chronic disease management. -s/p B12 injection for borderline B12 level at last visit; repeat today.   -worsening GERD; warranting PPI and H2 blocker most days.  Lab results from last visit included: Hemoglobin remains low at 9.1. Start FERROUS SULFATE 325MG  one tablet twice daily as discussed at visit. Also, the ferrous sulfate will turn your stools really dark and possibly black.  -Iron levels are really low.  -Vitamin B12 level remains borderline low; I hope the B12 injection helps you feel a little better. I recommend giving you another Vitamin B12 injection at your next visit. -Thyroid function is normal.   BP Readings from Last 3 Encounters:  12/31/16 116/68  12/17/16 130/84  12/03/16 128/62   Wt Readings from Last 3 Encounters:  12/31/16 185 lb (83.9 kg)  12/17/16 190 lb (86.2 kg)  12/03/16 191 lb (86.6 kg)   Immunization History  Administered Date(s) Administered  . Influenza Split 11/10/2011  . Influenza,inj,Quad PF,6+ Mos 12/20/2012, 10/26/2013, 12/01/2014, 01/02/2016, 12/03/2016  . Influenza-Unspecified 11/14/2009, 12/25/2010  . Pneumococcal Polysaccharide-23 01/02/2016  . Pneumococcal-Unspecified 12/18/2009  . Tdap 12/25/2010  . Zoster 02/24/2014    Review of Systems  Constitutional: Negative for chills, diaphoresis, fatigue and fever.  Eyes: Negative for visual disturbance.  Respiratory: Negative for cough and shortness of breath.   Cardiovascular: Negative for chest pain, palpitations and leg swelling.  Gastrointestinal: Positive for rectal pain. Negative for abdominal distention, abdominal pain, anal bleeding, blood in stool, constipation, diarrhea, nausea and vomiting.  Endocrine: Negative for cold intolerance, heat intolerance, polydipsia, polyphagia and polyuria.  Musculoskeletal: Positive for arthralgias and back  pain.  Neurological: Negative for dizziness, tremors, seizures, syncope, facial asymmetry, speech difficulty, weakness, light-headedness,  numbness and headaches.  Psychiatric/Behavioral: Positive for dysphoric mood. Negative for self-injury, sleep disturbance and suicidal ideas. The patient is nervous/anxious.     Past Medical History:  Diagnosis Date  . Allergic rhinitis, cause unspecified   . Anemia   . Anxiety   . Arthritis   . Benign neoplasm of colon   . Bruising   . Chest pain    a. 2015 Neg MV.  . Chronic pain syndrome   . Degeneration of lumbar or lumbosacral intervertebral disc   . Depressive disorder, not elsewhere classified   . Edema   . Emphysema   . GERD (gastroesophageal reflux disease)   . Hematuria, unspecified   . Hiatal hernia   . HLD (hyperlipidemia)   . Hyperglycemia   . Insomnia, unspecified   . Lumbosacral spondylosis without myelopathy   . Neurotic excoriations   . Osteoporosis, unspecified   . PAF (paroxysmal atrial fibrillation) ([place])    a. 08/2015 in setting of asp pna-->converted on dilt. Xarelto started (CHA2DS2VASc= 1).  . Personal history of urinary calculi   . Pneumonia   . Prurigo nodularis   . Pulmonary hypertension ([place])    a. 08/2015 Echo: EF 50-55%, no rwma, gr1 DD, mild MR, PASP 27mmHg.   Marland Kitchen Pure hypercholesterolemia   . Scoliosis (and kyphoscoliosis), idiopathic   . Squamous cell carcinoma   . Tobacco use disorder   . Unspecified hypothyroidism   . Unspecified vitamin D deficiency   . UTI (urinary tract infection)    frequent   Past Surgical History:  Procedure Laterality Date  . 2 harrington rods in back  1980  . ABDOMINAL HYSTERECTOMY     endometriosis; ovaries resected; no cancer.  [place] Anes  2006   both feet  . CATARACT EXTRACTION, BILATERAL    . [place]  . COLONOSCOPY WITH PROPOFOL N/A 11/21/2016   Procedure: COLONOSCOPY WITH PROPOFOL;  Surgeon: Manya Silvas, MD;  [facility] ENDOSCOPY;  Service: Endoscopy;  Laterality: N/A;  . ESOPHAGOGASTRODUODENOSCOPY N/A 11/21/2016   Procedure: ESOPHAGOGASTRODUODENOSCOPY  (EGD);  Surgeon: Manya Silvas, MD;  Location: [facility] ENDOSCOPY;  Service: Endoscopy;  Laterality: N/A;  . EYE SURGERY    . JOINT REPLACEMENT  03/16/12   L TKR; R TKR  . lumbar spine surgery  [place]  . squamous cell carcinoma resection  02/2010   [name]  . TONSILLECTOMY AND ADENOIDECTOMY  1964  . VAGINAL HYSTERECTOMY  1990   Allergies  Allergen Reactions  . Latex Other (See Comments)    OPEN SORES  . Levofloxacin   . Sulfonamide Derivatives    Current Outpatient Medications on File Prior to Visit  Medication Sig Dispense Refill  . albuterol (PROVENTIL HFA;VENTOLIN HFA) 108 (90 Base) MCG/ACT inhaler Inhale 2 puffs into the lungs every 6 (six) hours as needed. 3 Inhaler 1  . baclofen (LIORESAL) 10 MG tablet Take 0.5 tablets (5 mg total) by mouth 2 (two) times daily. 60 tablet 0  . clonazePAM (KLONOPIN) 0.5 MG tablet Take 1 tablet (0.5 mg total) by mouth 3 (three) times daily as needed for anxiety. 270 tablet 1  . diltiazem (CARDIZEM CD) 240 MG 24 hr capsule Take 1 capsule (240 mg total) by mouth daily. 90 capsule 3  . DULoxetine (CYMBALTA) 60 MG capsule Take 1 capsule (60 mg total) by mouth daily. 90 capsule 1  . ferrous  sulfate 325 (65 FE) MG EC tablet Take 1 tablet (325 mg total) by mouth 3 (three) times daily with meals. 90 tablet 3  . fexofenadine (ALLEGRA) 180 MG tablet Take 1 tablet (180 mg total) by mouth daily. 90 tablet 3  . fluticasone (FLONASE) 50 MCG/ACT nasal spray Place 2 sprays into both nostrils daily. 48 g 3  . levothyroxine (SYNTHROID) 50 MCG tablet Take 1 tablet (50 mcg total) by mouth daily before breakfast. 90 tablet 3  . Linaclotide (LINZESS PO) Take by mouth daily.    . montelukast (SINGULAIR) 10 MG tablet Take 10 mg by mouth at bedtime.  11  . OXYGEN Inhale 2 L into the lungs at bedtime.    . pantoprazole (PROTONIX) 40 MG tablet Take 1 tablet (40 mg total) by mouth daily at 12 noon. 90 tablet 3  . potassium chloride SA (K-DUR,KLOR-CON) 20 MEQ tablet  Take 1 tablet (20 mEq total) by mouth daily. 90 tablet 1  . rivaroxaban (XARELTO) 20 MG TABS tablet Take 1 tablet (20 mg total) by mouth daily with supper. 90 tablet 3  . rosuvastatin (CRESTOR) 20 MG tablet Take 1 tablet (20 mg total) by mouth daily. 90 tablet 3  . traMADol (ULTRAM) 50 MG tablet Take 100 mg by mouth every 6 (six) hours as needed.    . tretinoin (RETIN-A) 0.1 % cream Apply a pea sized amount to the face nightly 20 min after washing, avoiding area around the eyes and creases around the nose.    . triamcinolone cream (KENALOG) 0.1 % Apply 1 application topically 2 (two) times daily. 30 g 0  . zolpidem (AMBIEN CR) 12.5 MG CR tablet Take 1 tablet (12.5 mg total) by mouth at bedtime as needed for sleep. 90 tablet 1   Current Facility-Administered Medications on File Prior to Visit  Medication Dose Route Frequency Provider Last Rate Last Dose  . cyanocobalamin ((VITAMIN B-12)) injection 1,000 mcg  1,000 mcg Intramuscular Q30 days Wardell Honour, MD   1,000 mcg at 12/31/16 1229   Social History   Socioeconomic History  . Marital status: Married    Spouse name: Alisea Matte  . Number of children: 1  . Years of education: 16  . Highest education level: Not on file  Social Needs  . Financial resource strain: Not on file  . Food insecurity - worry: Not on file  . Food insecurity - inability: Not on file  . Transportation needs - medical: Not on file  . Transportation needs - non-medical: Not on file  Occupational History  . Occupation: disabled    Comment: 1993   DDD  Tobacco Use  . Smoking status: Former Smoker    Packs/day: 1.00    Years: 25.00    Pack years: 25.00    Types: Cigarettes    Start date: 11/05/1984    Last attempt to quit: 11/06/2006    Years since quitting: 10.2  . Smokeless tobacco: Never Used  . Tobacco comment: quit 11/07  Substance and Sexual Activity  . Alcohol use: No    Alcohol/week: 0.0 oz  . Drug use: No  . Sexual activity: Yes    Partners:  Male  Other Topics Concern  . Not on file  Social History Narrative   Marital status:  Married since 1992; second marriage; happily married; no abuse.      Children:  1 daughter; 3 stepchildren; 8 grandchildren; 2 gg.   Daughter in [place].  Husband's children in [place].  Employment:  Retired/disabled due to DDD lumbar.       Lives:   Lives with spouse;      Caffeine use cosumes a moderate amount 1 serving per day.      Always uses seat belts.       No guns in home.       Smoke alarm and carbon monoxide detectors in the home.       No Living Will.   Family History  Problem Relation Age of Onset  . Coronary artery disease Brother        stents    SIster stents also  . Heart disease Brother   . Heart failure Brother        CABG  AMI @ 10  . Heart disease Brother   . Heart failure Mother        pacemaker  . Hypertension Mother   . Heart disease Mother        CHF; pacemaker  . Heart failure Father        AMI  . Cancer Father 34       colon cancer  . Heart disease Father        mutliple AMIs  . Cancer Maternal Grandmother        ovarian  . Heart disease Sister 35       cardiac stenting  . Heart disease Brother   . Heart disease Brother   . GI problems Unknown        Family history malignancy, GI tract  . Breast cancer Cousin        Objective:    BP 116/68   Pulse (!) 103   Temp 98.6 F (37 C) (Oral)   Resp 16   Ht 5' 1.02" (1.55 m)   Wt 185 lb (83.9 kg)   SpO2 97%   BMI 34.93 kg/m  Physical Exam  Constitutional: She is oriented to person, place, and time. She appears well-developed and well-nourished. No distress.  HENT:  Head: Normocephalic and atraumatic.  Right Ear: External ear normal.  Left Ear: External ear normal.  Nose: Nose normal.  Mouth/Throat: Oropharynx is clear and moist.  Eyes: Conjunctivae and EOM are normal. Pupils are equal, round, and reactive to light.  Neck: Normal range of motion. Neck supple. Carotid bruit is not present. No  thyromegaly present.  Cardiovascular: Normal rate, regular rhythm, normal heart sounds and intact distal pulses. Exam reveals no gallop and no friction rub.  No murmur heard. Pulmonary/Chest: Effort normal and breath sounds normal. She has no wheezes. She has no rales.  Abdominal: Soft. Bowel sounds are normal. She exhibits no distension and no mass. There is no tenderness. There is no rebound and no guarding.  Lymphadenopathy:    She has no cervical adenopathy.  Neurological: She is alert and oriented to person, place, and time. No cranial nerve deficit.  Skin: Skin is warm and dry. No rash noted. She is not diaphoretic. No erythema. No pallor.  Psychiatric: She has a normal mood and affect. Her behavior is normal.   No results found. Depression screen [facility] 2/9 12/31/2016 12/03/2016 07/30/2016 04/08/2016 01/02/2016  Decreased Interest 0 0 0 0 0  Down, Depressed, Hopeless 0 0 0 0 0  PHQ - 2 Score 0 0 0 0 0   Fall Risk  12/31/2016 12/03/2016 07/30/2016 04/08/2016 01/02/2016  Falls in the past year? No No No No No  Number falls in past yr: - - - - -  Injury with Fall? - - - - -  Comment - - - - -       Assessment & Plan:   1. Iron deficiency anemia due to chronic blood loss   2. Vitamin B12 deficiency   3. Gastroesophageal reflux disease without esophagitis   4. Atrial fibrillation with RVR ([place])   5. Chronic pain syndrome   6. Anxiety and depression   7. Spondylosis of thoracic region without myelopathy or radiculopathy   8. Spondylosis of lumbar region without myelopathy or radiculopathy   9. Rectal bleeding     Stable iron deficiency anemia.  Obtain labs.  Tolerating oral iron.  Administer repeat B12 injection today based on severity of vitamin D deficiency.  Patient undergoing marital strain at current time.  Will be visiting family in [place] in the upcoming month and may have a prolonged visit for 4-6 weeks.  Considering divorce at this time.  Suffering with chronic pain due to  thoracic and lumbar spine pathology.  Followed by pain management and neurosurgery.  Plan to remove nerve stimulator in the upcoming few months due to lack of pain control.  Anal fissure significantly improved.  External hemorrhoids have decreased in size.  Status post follow-up with Dr. Margarita Rana general surgery.  No surgery indicated at this time which is excellent news.  Status post colonoscopy without evidence of other active bleeding.  Orders Placed This Encounter  Procedures  . Comprehensive metabolic panel  . Iron  . POCT CBC   No orders of the defined types were placed in this encounter.   Return in about 4 weeks (around 01/28/2017) for recheck.   Kristi Elayne Guerin, M.D. Primary Care at [facility] previously [facility] [address] [place], [place]  [phone] phone [phone] fax

## 2017-01-01 LAB — COMPREHENSIVE METABOLIC PANEL
A/G RATIO: 2.1 (ref 1.2–2.2)
ALT: 21 IU/L (ref 0–32)
AST: 28 IU/L (ref 0–40)
Albumin: 4.8 g/dL (ref 3.6–4.8)
Alkaline Phosphatase: 103 IU/L (ref 39–117)
BUN/Creatinine Ratio: 8 — ABNORMAL LOW (ref 12–28)
BUN: 8 mg/dL (ref 8–27)
Bilirubin Total: 0.5 mg/dL (ref 0.0–1.2)
CALCIUM: 9.2 mg/dL (ref 8.7–10.3)
CO2: 23 mmol/L (ref 20–29)
Chloride: 101 mmol/L (ref 96–106)
Creatinine, Ser: 0.96 mg/dL (ref 0.57–1.00)
GFR, EST AFRICAN AMERICAN: 72 mL/min/{1.73_m2} (ref >59)
GFR, EST NON AFRICAN AMERICAN: 63 mL/min/{1.73_m2} (ref >59)
GLOBULIN, TOTAL: 2.3 g/dL (ref 1.5–4.5)
Glucose: 82 mg/dL (ref 65–99)
POTASSIUM: 4.2 mmol/L (ref 3.5–5.2)
Sodium: 142 mmol/L (ref 134–144)
TOTAL PROTEIN: 7.1 g/dL (ref 6.0–8.5)

## 2017-01-01 LAB — IRON: Iron: 42 ug/dL (ref 27–139)

## 2017-01-08 ENCOUNTER — Encounter: Payer: Self-pay | Admitting: Family Medicine

## 2017-01-20 DIAGNOSIS — F329 Major depressive disorder, single episode, unspecified: Secondary | ICD-10-CM | POA: Insufficient documentation

## 2017-01-20 DIAGNOSIS — F419 Anxiety disorder, unspecified: Secondary | ICD-10-CM

## 2017-01-28 ENCOUNTER — Encounter: Payer: Self-pay | Admitting: Family Medicine

## 2017-01-28 ENCOUNTER — Ambulatory Visit (INDEPENDENT_AMBULATORY_CARE_PROVIDER_SITE_OTHER): Payer: Medicare Other | Admitting: Family Medicine

## 2017-01-28 VITALS — BP 128/88 | HR 74 | Temp 98.7°F | Resp 16 | Ht 61.0 in | Wt 189.0 lb

## 2017-01-28 DIAGNOSIS — F329 Major depressive disorder, single episode, unspecified: Secondary | ICD-10-CM | POA: Diagnosis not present

## 2017-01-28 DIAGNOSIS — F419 Anxiety disorder, unspecified: Secondary | ICD-10-CM

## 2017-01-28 DIAGNOSIS — E538 Deficiency of other specified B group vitamins: Secondary | ICD-10-CM | POA: Diagnosis not present

## 2017-01-28 DIAGNOSIS — J4541 Moderate persistent asthma with (acute) exacerbation: Secondary | ICD-10-CM

## 2017-01-28 DIAGNOSIS — M47816 Spondylosis without myelopathy or radiculopathy, lumbar region: Secondary | ICD-10-CM

## 2017-01-28 DIAGNOSIS — K5909 Other constipation: Secondary | ICD-10-CM | POA: Diagnosis not present

## 2017-01-28 DIAGNOSIS — M47814 Spondylosis without myelopathy or radiculopathy, thoracic region: Secondary | ICD-10-CM | POA: Diagnosis not present

## 2017-01-28 DIAGNOSIS — G894 Chronic pain syndrome: Secondary | ICD-10-CM | POA: Diagnosis not present

## 2017-01-28 DIAGNOSIS — D500 Iron deficiency anemia secondary to blood loss (chronic): Secondary | ICD-10-CM | POA: Diagnosis not present

## 2017-01-28 MED ORDER — LINACLOTIDE 145 MCG PO CAPS: 145.0000 ug | ORAL_CAPSULE | Freq: Every day | ORAL | 1 refills | Status: DC

## 2017-01-28 NOTE — Progress Notes (Signed)
Subjective:    Patient ID: Sonya [name], female    DOB: 1950-05-12, 66 y.o.   MRN: [id number]  01/28/2017  Anemia (anemia, iron deficiency, now having increased constipation and no relief with miralax, and linzess) and Depression (13 on screening tool)    HPI This 66 y.o. female presents for evaluation of anemia iron deficiency, B12 deficiency, and anxiety/depression.  Management changes made at last visit include: -Stable iron deficiency anemia.  Obtain labs.  Tolerating oral iron. -Administer repeat B12 injection today based on severity of vitamin D deficiency. -Patient undergoing marital strain at current time.  Will be visiting family in [place] in the upcoming month and may have a prolonged visit for 4-6 weeks.  Considering divorce at this time. -Suffering with chronic pain due to thoracic and lumbar spine pathology.  Followed by pain management and neurosurgery.  Plan to remove nerve stimulator in the upcoming few months due to lack of pain control. -Anal fissure significantly improved.  External hemorrhoids have decreased in size.  Status post follow-up with Dr. Margarita Rana general surgery.  No surgery indicated at this time which is excellent news.  Status post colonoscopy without evidence of other active bleeding.  Went to [place] for Thanksgiving from Wednesday to Sunday. Leaving Saturday for [place].  Everyone keeps talking about snow.  Worry about snow in the mountains.  Driving.  Staying one month.  Returning to attend a couple of appointments. Dermatologist.   No bowel movement in six days. Linzess 145mg .  Tried to double 149mg ; also taking Miralax.  Miserable.    S/p spinal stimulator removal which was painful.  Norco for one week.    Husband and patient have talked.  Husband is being polite especially in the morning.  Going to bed at 7:00pm and slept until 9:00am.  Not sure if   COPD: saw Dr. Raul Del yesterday; had fever 100.0.  Diagnosed with bronchitis.   Cough; rare wheezing; using nebulizer and Flonase. Allegra.  Prescribed Doxycycline, Symbicort, Singulair. Xopenex to replace Albuterol.    BP Readings from Last 3 Encounters:  01/28/17 128/88  12/31/16 116/68  12/17/16 130/84   Wt Readings from Last 3 Encounters:  01/28/17 189 lb (85.7 kg)  12/31/16 185 lb (83.9 kg)  12/17/16 190 lb (86.2 kg)   Immunization History  Administered Date(s) Administered  . Influenza Split 11/10/2011  . Influenza,inj,Quad PF,6+ Mos 12/20/2012, 10/26/2013, 12/01/2014, 01/02/2016, 12/03/2016  . Influenza-Unspecified 11/14/2009, 12/25/2010  . Pneumococcal Polysaccharide-23 01/02/2016  . Pneumococcal-Unspecified 12/18/2009  . Tdap 12/25/2010  . Zoster 02/24/2014    Review of Systems  Constitutional: Negative for chills, diaphoresis, fatigue and fever.  Eyes: Negative for visual disturbance.  Respiratory: Positive for cough and wheezing. Negative for shortness of breath.   Cardiovascular: Negative for chest pain, palpitations and leg swelling.  Gastrointestinal: Negative for abdominal distention, abdominal pain, anal bleeding, blood in stool, constipation, diarrhea, nausea, rectal pain and vomiting.  Endocrine: Negative for cold intolerance, heat intolerance, polydipsia, polyphagia and polyuria.  Musculoskeletal: Positive for back pain.  Neurological: Negative for dizziness, tremors, seizures, syncope, facial asymmetry, speech difficulty, weakness, light-headedness, numbness and headaches.  Psychiatric/Behavioral: Positive for dysphoric mood. The patient is nervous/anxious.     Past Medical History:  Diagnosis Date  . Allergic rhinitis, cause unspecified   . Anemia   . Anxiety   . Arthritis   . Benign neoplasm of colon   . Bruising   . Chest pain    a. 2015 Neg MV.  . Chronic  pain syndrome   . Degeneration of lumbar or lumbosacral intervertebral disc   . Depressive disorder, not elsewhere classified   . Edema   . Emphysema   . GERD  (gastroesophageal reflux disease)   . Hematuria, unspecified   . Hiatal hernia   . HLD (hyperlipidemia)   . Hyperglycemia   . Insomnia, unspecified   . Lumbosacral spondylosis without myelopathy   . Neurotic excoriations   . Osteoporosis, unspecified   . PAF (paroxysmal atrial fibrillation) ([place])    a. 08/2015 in setting of asp pna-->converted on dilt. Xarelto started (CHA2DS2VASc= 1).  . Personal history of urinary calculi   . Pneumonia   . Prurigo nodularis   . Pulmonary hypertension ([place])    a. 08/2015 Echo: EF 50-55%, no rwma, gr1 DD, mild MR, PASP 70mmHg.   Marland Kitchen Pure hypercholesterolemia   . Scoliosis (and kyphoscoliosis), idiopathic   . Squamous cell carcinoma   . Tobacco use disorder   . Unspecified hypothyroidism   . Unspecified vitamin D deficiency   . UTI (urinary tract infection)    frequent   Past Surgical History:  Procedure Laterality Date  . 2 harrington rods in back  1980  . ABDOMINAL HYSTERECTOMY     endometriosis; ovaries resected; no cancer.  [place] Anes  2006   both feet  . CATARACT EXTRACTION, BILATERAL    . [place]  . COLONOSCOPY WITH PROPOFOL N/A 11/21/2016   Procedure: COLONOSCOPY WITH PROPOFOL;  Surgeon: Manya Silvas, MD;  Location: [facility] ENDOSCOPY;  Service: Endoscopy;  Laterality: N/A;  . ESOPHAGOGASTRODUODENOSCOPY N/A 11/21/2016   Procedure: ESOPHAGOGASTRODUODENOSCOPY (EGD);  Surgeon: Manya Silvas, MD;  Location[facility] ENDOSCOPY;  Service: Endoscopy;  Laterality: N/A;  . EYE SURGERY    . JOINT REPLACEMENT  03/16/12   L TKR; R TKR  . lumbar spine surgery  Agency  . OTHER SURGICAL HISTORY  11/29   spinal stimulator removed   . squamous cell carcinoma resection  02/2010   [name]  . TONSILLECTOMY AND ADENOIDECTOMY  1964  . VAGINAL HYSTERECTOMY  1990   Allergies  Allergen Reactions  . Latex Other (See Comments)    OPEN SORES  . Levofloxacin   . Sulfonamide Derivatives    Current Outpatient  Medications on File Prior to Visit  Medication Sig Dispense Refill  . albuterol (PROVENTIL HFA;VENTOLIN HFA) 108 (90 Base) MCG/ACT inhaler Inhale 2 puffs into the lungs every 6 (six) hours as needed. 3 Inhaler 1  . baclofen (LIORESAL) 10 MG tablet Take 0.5 tablets (5 mg total) by mouth 2 (two) times daily. 60 tablet 0  . clonazePAM (KLONOPIN) 0.5 MG tablet Take 1 tablet (0.5 mg total) by mouth 3 (three) times daily as needed for anxiety. 270 tablet 1  . diltiazem (CARDIZEM CD) 240 MG 24 hr capsule Take 1 capsule (240 mg total) by mouth daily. 90 capsule 3  . DULoxetine (CYMBALTA) 60 MG capsule Take 1 capsule (60 mg total) by mouth daily. 90 capsule 1  . ferrous sulfate 325 (65 FE) MG EC tablet Take 1 tablet (325 mg total) by mouth 3 (three) times daily with meals. 90 tablet 3  . fexofenadine (ALLEGRA) 180 MG tablet Take 1 tablet (180 mg total) by mouth daily. 90 tablet 3  . fluticasone (FLONASE) 50 MCG/ACT nasal spray Place 2 sprays into both nostrils daily. 48 g 3  . furosemide (LASIX) 40 MG tablet Take 40 mg by mouth as needed.    Marland Kitchen  levothyroxine (SYNTHROID) 50 MCG tablet Take 1 tablet (50 mcg total) by mouth daily before breakfast. 90 tablet 3  . montelukast (SINGULAIR) 10 MG tablet Take 10 mg by mouth at bedtime.  11  . OXYGEN Inhale 2 L into the lungs at bedtime.    . pantoprazole (PROTONIX) 40 MG tablet Take 1 tablet (40 mg total) by mouth daily at 12 noon. 90 tablet 3  . potassium chloride SA (K-DUR,KLOR-CON) 20 MEQ tablet Take 1 tablet (20 mEq total) by mouth daily. 90 tablet 1  . rivaroxaban (XARELTO) 20 MG TABS tablet Take 1 tablet (20 mg total) by mouth daily with supper. 90 tablet 3  . rosuvastatin (CRESTOR) 20 MG tablet Take 1 tablet (20 mg total) by mouth daily. 90 tablet 3  . traMADol (ULTRAM) 50 MG tablet Take 100 mg by mouth every 6 (six) hours as needed.    . tretinoin (RETIN-A) 0.1 % cream Apply a pea sized amount to the face nightly 20 min after washing, avoiding area around  the eyes and creases around the nose.    . triamcinolone cream (KENALOG) 0.1 % Apply 1 application topically 2 (two) times daily. 30 g 0  . zolpidem (AMBIEN CR) 12.5 MG CR tablet Take 1 tablet (12.5 mg total) by mouth at bedtime as needed for sleep. 90 tablet 1   Current Facility-Administered Medications on File Prior to Visit  Medication Dose Route Frequency Provider Last Rate Last Dose  . cyanocobalamin ((VITAMIN B-12)) injection 1,000 mcg  1,000 mcg Intramuscular Q30 days Wardell Honour, MD   1,000 mcg at 01/28/17 1318   Social History   Socioeconomic History  . Marital status: Married    Spouse name: Eulogia Dismore  . Number of children: 1  . Years of education: 91  . Highest education level: Not on file  Social Needs  . Financial resource strain: Not on file  . Food insecurity - worry: Not on file  . Food insecurity - inability: Not on file  . Transportation needs - medical: Not on file  . Transportation needs - non-medical: Not on file  Occupational History  . Occupation: disabled    Comment: 1993   DDD  Tobacco Use  . Smoking status: Former Smoker    Packs/day: 1.00    Years: 25.00    Pack years: 25.00    Types: Cigarettes    Start date: 11/05/1984    Last attempt to quit: 11/06/2006    Years since quitting: 10.2  . Smokeless tobacco: Never Used  . Tobacco comment: quit 11/07  Substance and Sexual Activity  . Alcohol use: No    Alcohol/week: 0.0 oz  . Drug use: No  . Sexual activity: Yes    Partners: Male  Other Topics Concern  . Not on file  Social History Narrative   Marital status:  Married since 1992; second marriage; happily married; no abuse.      Children:  1 daughter; 3 stepchildren; 8 grandchildren; 2 gg.   Daughter in [place].  Husband's children in [place].      Employment:  Retired/disabled due to DDD lumbar.       Lives:   Lives with spouse;      Caffeine use cosumes a moderate amount 1 serving per day.      Always uses seat belts.       No guns in  home.       Smoke alarm and carbon monoxide detectors in the home.  No Living Will.   Family History  Problem Relation Age of Onset  . Coronary artery disease Brother        stents    SIster stents also  . Heart disease Brother   . Heart failure Brother        CABG  AMI @ 58  . Heart disease Brother   . Heart failure Mother        pacemaker  . Hypertension Mother   . Heart disease Mother        CHF; pacemaker  . Heart failure Father        AMI  . Cancer Father 9       colon cancer  . Heart disease Father        mutliple AMIs  . Cancer Maternal Grandmother        ovarian  . Heart disease Sister 21       cardiac stenting  . Heart disease Brother   . Heart disease Brother   . GI problems Unknown        Family history malignancy, GI tract  . Breast cancer Cousin        Objective:    BP 128/88   Pulse 74   Temp 98.7 F (37.1 C) (Oral)   Resp 16   Ht 5\' 1"  (1.549 m)   Wt 189 lb (85.7 kg)   SpO2 98%   BMI 35.71 kg/m  Physical Exam  Constitutional: She is oriented to person, place, and time. She appears well-developed and well-nourished. No distress.  HENT:  Head: Normocephalic and atraumatic.  Right Ear: External ear normal.  Left Ear: External ear normal.  Nose: Nose normal.  Mouth/Throat: Oropharynx is clear and moist.  Eyes: Conjunctivae and EOM are normal. Pupils are equal, round, and reactive to light.  Neck: Normal range of motion. Neck supple. Carotid bruit is not present. No thyromegaly present.  Cardiovascular: Normal rate, regular rhythm, normal heart sounds and intact distal pulses. Exam reveals no gallop and no friction rub.  No murmur heard. Pulmonary/Chest: Effort normal and breath sounds normal. She has no wheezes. She has no rales.  Abdominal: Soft. Bowel sounds are normal. She exhibits no distension and no mass. There is no tenderness. There is no rebound and no guarding.  Lymphadenopathy:    She has no cervical adenopathy.  Neurological:  She is alert and oriented to person, place, and time. No cranial nerve deficit.  Skin: Skin is warm and dry. No rash noted. She is not diaphoretic. No erythema. No pallor.  Psychiatric: She has a normal mood and affect. Her behavior is normal.   No results found. Depression screen [facility] 2/9 01/28/2017 12/31/2016 12/03/2016 07/30/2016 04/08/2016  Decreased Interest 2 0 0 0 0  Down, Depressed, Hopeless 3 0 0 0 0  PHQ - 2 Score 5 0 0 0 0  Altered sleeping 2 - - - -  Tired, decreased energy 3 - - - -  Change in appetite 3 - - - -  Feeling bad or failure about yourself  0 - - - -  Trouble concentrating 0 - - - -  Moving slowly or fidgety/restless 0 - - - -  Suicidal thoughts 0 - - - -  PHQ-9 Score 13 - - - -  Difficult doing work/chores Somewhat difficult - - - -   Fall Risk  01/28/2017 12/31/2016 12/03/2016 07/30/2016 04/08/2016  Falls in the past year? No No No No No  Number falls in  past yr: - - - - -  Injury with Fall? - - - - -  [place]:   1. Iron deficiency anemia due to chronic blood loss   2. Vitamin B12 deficiency   3. Moderate persistent asthma with acute exacerbation   4. Constipation, chronic   5. Spondylosis of lumbar region without myelopathy or radiculopathy   6. 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 Anxiety and depression   8. Chronic pain syndrome    Improving iron deficiency anemia.  Decrease iron therapy to 1 tablet daily. Status post B12 injection in office. New onset acute bronchitis with acute exacerbation.  Status post evaluation by pulmonology yesterday. Status post removal of nerve stimulator for chronic back pain.  Trying to avoid long-term opiates. Persistent anxiety and depression due to marital discord.  Traveling to [place] for the holidays and will be gone for an entire month.  Plans to return in January for follow-up and to attend medical appointments.  Followed by pain management as well  as psychiatry.  Counseling provided in office. Repeat labs today; refill of Linzess provided.Increase Miralax to bid for one week.   Orders Placed This Encounter  Procedures  . CBC with Differential/Platelet  . Iron  . Iron and TIBC   Meds ordered this encounter  Medications  . linaclotide (LINZESS) 145 MCG CAPS capsule    Sig: Take 1 capsule (145 mcg total) by mouth daily before breakfast.    Dispense:  90 capsule    Refill:  1    Return in about 6 weeks (around 03/11/2017) for follow-up chronic medical conditions.   Kristi Elayne Guerin, M.D. Primary Care at [facility] [address] [place], [place]  [address] [phone] phone [phone] fax

## 2017-01-28 NOTE — Patient Instructions (Addendum)
   Continue Linzess 145mg  daily. Increase MIralax to twice daily for several days until you get relief. Decrease iron to one daily.  IF you received an x-ray today, you will receive an invoice from [facility]. Please contact [facility] at [phone] with questions or concerns regarding your invoice.   IF you received labwork today, you will receive an invoice from [facility]. Please contact [facility] at [phone] with questions or concerns regarding your invoice.   Our billing staff will not be able to assist you with questions regarding bills from these companies.  You will be contacted with the lab results as soon as they are available. The fastest way to get your results is to activate your My Chart account. Instructions are located on the last page of this paperwork. If you have not heard from [place] regarding the results in 2 weeks, please contact this office.

## 2017-01-29 LAB — CBC WITH DIFFERENTIAL/PLATELET
BASOS ABS: 0 10*3/uL (ref 0.0–0.2)
Basos: 0 %
EOS (ABSOLUTE): 0.1 10*3/uL (ref 0.0–0.4)
Eos: 1 %
Hematocrit: 37.2 % (ref 34.0–46.6)
Hemoglobin: 11.3 g/dL (ref 11.1–15.9)
Immature Grans (Abs): 0 10*3/uL (ref 0.0–0.1)
Immature Granulocytes: 0 %
LYMPHS ABS: 1 10*3/uL (ref 0.7–3.1)
LYMPHS: 18 %
MCH: 24.4 pg — AB (ref 26.6–33.0)
MCHC: 30.4 g/dL — ABNORMAL LOW (ref 31.5–35.7)
MCV: 80 fL (ref 79–97)
Monocytes Absolute: 0.4 10*3/uL (ref 0.1–0.9)
Monocytes: 7 %
Neutrophils Absolute: 4.2 10*3/uL (ref 1.4–7.0)
Neutrophils: 74 %
PLATELETS: 300 10*3/uL (ref 150–379)
RBC: 4.63 x10E6/uL (ref 3.77–5.28)
RDW: 24.7 % — AB (ref 12.3–15.4)
WBC: 5.7 10*3/uL (ref 3.4–10.8)

## 2017-01-29 LAB — IRON AND TIBC
IRON SATURATION: 6 % — AB (ref 15–55)
Iron: 28 ug/dL (ref 27–139)
TIBC: 484 ug/dL — AB (ref 250–450)
UIBC: 456 ug/dL — ABNORMAL HIGH (ref 118–369)

## 2017-03-04 ENCOUNTER — Ambulatory Visit: Payer: Medicare Other | Admitting: Family Medicine

## 2017-03-04 ENCOUNTER — Other Ambulatory Visit: Payer: Self-pay

## 2017-03-04 ENCOUNTER — Encounter: Payer: Self-pay | Admitting: Family Medicine

## 2017-03-04 ENCOUNTER — Ambulatory Visit (INDEPENDENT_AMBULATORY_CARE_PROVIDER_SITE_OTHER): Payer: Medicare HMO | Admitting: Family Medicine

## 2017-03-04 ENCOUNTER — Ambulatory Visit (INDEPENDENT_AMBULATORY_CARE_PROVIDER_SITE_OTHER): Payer: Medicare HMO

## 2017-03-04 VITALS — BP 110/86 | HR 84 | Temp 98.5°F | Resp 24 | Ht 60.83 in | Wt 182.2 lb

## 2017-03-04 DIAGNOSIS — E7800 Pure hypercholesterolemia, unspecified: Secondary | ICD-10-CM | POA: Diagnosis not present

## 2017-03-04 DIAGNOSIS — D508 Other iron deficiency anemias: Secondary | ICD-10-CM

## 2017-03-04 DIAGNOSIS — G894 Chronic pain syndrome: Secondary | ICD-10-CM | POA: Diagnosis not present

## 2017-03-04 DIAGNOSIS — K5903 Drug induced constipation: Secondary | ICD-10-CM | POA: Diagnosis not present

## 2017-03-04 DIAGNOSIS — Z79891 Long term (current) use of opiate analgesic: Secondary | ICD-10-CM | POA: Diagnosis not present

## 2017-03-04 DIAGNOSIS — E538 Deficiency of other specified B group vitamins: Secondary | ICD-10-CM

## 2017-03-04 DIAGNOSIS — R059 Cough, unspecified: Secondary | ICD-10-CM

## 2017-03-04 DIAGNOSIS — R042 Hemoptysis: Secondary | ICD-10-CM

## 2017-03-04 DIAGNOSIS — R05 Cough: Secondary | ICD-10-CM

## 2017-03-04 DIAGNOSIS — J4541 Moderate persistent asthma with (acute) exacerbation: Secondary | ICD-10-CM | POA: Diagnosis not present

## 2017-03-04 DIAGNOSIS — Z630 Problems in relationship with spouse or partner: Secondary | ICD-10-CM

## 2017-03-04 DIAGNOSIS — J0101 Acute recurrent maxillary sinusitis: Secondary | ICD-10-CM

## 2017-03-04 DIAGNOSIS — Z79899 Other long term (current) drug therapy: Secondary | ICD-10-CM | POA: Diagnosis not present

## 2017-03-04 DIAGNOSIS — M79606 Pain in leg, unspecified: Secondary | ICD-10-CM | POA: Diagnosis not present

## 2017-03-04 DIAGNOSIS — R69 Illness, unspecified: Secondary | ICD-10-CM | POA: Diagnosis not present

## 2017-03-04 DIAGNOSIS — M47817 Spondylosis without myelopathy or radiculopathy, lumbosacral region: Secondary | ICD-10-CM | POA: Diagnosis not present

## 2017-03-04 DIAGNOSIS — M961 Postlaminectomy syndrome, not elsewhere classified: Secondary | ICD-10-CM | POA: Diagnosis not present

## 2017-03-04 LAB — POC INFLUENZA A&B (BINAX/QUICKVUE)
Influenza A, POC: NEGATIVE
Influenza B, POC: NEGATIVE

## 2017-03-04 MED ORDER — PREDNISONE 20 MG PO TABS: ORAL_TABLET | ORAL | 0 refills | Status: AC

## 2017-03-04 MED ORDER — AMOXICILLIN-POT CLAVULANATE 875-125 MG PO TABS: 1.0000 | ORAL_TABLET | Freq: Two times a day (BID) | ORAL | 0 refills | Status: DC

## 2017-03-04 MED ORDER — LINACLOTIDE 290 MCG PO CAPS: 290.0000 ug | ORAL_CAPSULE | Freq: Every day | ORAL | 3 refills | Status: AC

## 2017-03-04 NOTE — Progress Notes (Signed)
Subjective:    Patient ID: Sonya [name], female    DOB: [birth year], 67 y.o.   MRN: [id number]  03/04/2017  Chronic Conditions (1 month ) and Anemia    HPI This 67 y.o. female presents for one month follow-up of iron deficiency anemia, B12 deficiency, and vitamin D deficiency.  Management changes made at last visit include the following: Improving iron deficiency anemia.  Decrease iron therapy to 1 tablet daily. Status post B12 injection in office. New onset acute bronchitis with acute exacerbation.  Status post evaluation by pulmonology yesterday. Status post removal of nerve stimulator for chronic back pain.  Trying to avoid long-term opiates. Persistent anxiety and depression due to marital discord.  Traveling to [place] for the holidays and will be gone for an entire month.  Plans to return in January for follow-up and to attend medical appointments.  Followed by pain management as well as psychiatry.  Counseling provided in office. Repeat labs today; refill of Linzess provided.Increase Miralax to bid for one week.   Had a really wonderful time in [place].  Returned last night.  Continues to suffer with chest congestion; treated by Dr. Raul Del; now having sputum production in morning and nighttime; thick and bloody; moderate amount of blood.  Very thick. Quarter sized amount of mucous with blood.  After takes a shower and can steam self, will have less.  Increased sputum with activity today by attending pain management and PCP appointment.  Everyone in [place] is sick. With coughing, has horrible headache.  Feels like sinus congestion; behind eyes.  [place] treated with Symbicort but cost prohibitive; Prednisone, Xopenex, Doxycycline, Singulair.  While traveling, got sick again on six days ago. +low grade fever Tmax 100.0.  +chills/sweats.  +HA. Horrible cough; mild sore throat for one day; +rhinorrhea; +nasal congestion.  Still bloody mucous. Bloody nasal congestion.  Only  Flonase nasal spray. +SOB.  Intermittent wheezing morning and nighttime.  Takign Xopenex or nebulizer twice daily.  Taking one iron daily.    Will need to get an attorney from the [employer]; if the divorce is uncontested, they will pay for it all. If it will be contested, they will not pay for everything. Must see a judge first for legal separation.  Broke so cannot afford to live alone.  Will stay with sister; sister lives alone.  The house patient grew up in.  Talked a lot.  She has tried to help patient financially.  Drove straight to [place] alone; took 12 hours. Sister paid for gas and a hotel.  Went to see mother much more than usually does.  Spent more time with great grandchildren.  Happy in [place].    With Levaquin, developed a rash while in the sun for extended amount of time.  BP Readings from Last 3 Encounters:  03/04/17 110/86  01/28/17 128/88  12/31/16 116/68   Wt Readings from Last 3 Encounters:  03/04/17 182 lb 3.2 oz (82.6 kg)  01/28/17 189 lb (85.7 kg)  12/31/16 185 lb (83.9 kg)   Immunization History  Administered Date(s) Administered  . Influenza Split 11/10/2011  . Influenza,inj,Quad PF,6+ Mos 12/20/2012, 10/26/2013, 12/01/2014, 01/02/2016, 12/03/2016  . Influenza-Unspecified 11/14/2009, 12/25/2010  . Pneumococcal Polysaccharide-23 01/02/2016  . Pneumococcal-Unspecified 12/18/2009  . Tdap 12/25/2010  . Zoster 02/24/2014    Review of Systems  Constitutional: Positive for chills, diaphoresis and fatigue. Negative for fever.  HENT: Positive for congestion, postnasal drip, rhinorrhea, sinus pressure and sinus pain.   Eyes: Negative for visual disturbance.  Respiratory: Positive for cough, shortness of breath and wheezing.   Cardiovascular: Negative for chest pain, palpitations and leg swelling.  Gastrointestinal: Positive for constipation. Negative for abdominal distention, abdominal pain, anal bleeding, blood in stool, diarrhea, nausea, rectal pain and vomiting.    Endocrine: Negative for cold intolerance, heat intolerance, polydipsia, polyphagia and polyuria.  Musculoskeletal: Positive for back pain.  Neurological: Negative for dizziness, tremors, seizures, syncope, facial asymmetry, speech difficulty, weakness, light-headedness, numbness and headaches.  Psychiatric/Behavioral: Positive for dysphoric mood. The patient is nervous/anxious.     Past Medical History:  Diagnosis Date  . Allergic rhinitis, cause unspecified   . Anemia   . Anxiety   . Arthritis   . Benign neoplasm of colon   . Bruising   . Chest pain    a. 2015 Neg MV.  . Chronic pain syndrome   . Degeneration of lumbar or lumbosacral intervertebral disc   . Depressive disorder, not elsewhere classified   . Edema   . Emphysema   . GERD (gastroesophageal reflux disease)   . Hematuria, unspecified   . Hiatal hernia   . HLD (hyperlipidemia)   . Hyperglycemia   . Insomnia, unspecified   . Lumbosacral spondylosis without myelopathy   . Neurotic excoriations   . Osteoporosis, unspecified   . PAF (paroxysmal atrial fibrillation) ([place])    a. 08/2015 in setting of asp pna-->converted on dilt. Xarelto started (CHA2DS2VASc= 1).  . Personal history of urinary calculi   . Pneumonia   . Prurigo nodularis   . Pulmonary hypertension ([place])    a. 08/2015 Echo: EF 50-55%, no rwma, gr1 DD, mild MR, PASP 76mmHg.   Marland Kitchen Pure hypercholesterolemia   . Scoliosis (and kyphoscoliosis), idiopathic   . Squamous cell carcinoma   . Tobacco use disorder   . Unspecified hypothyroidism   . Unspecified vitamin D deficiency   . UTI (urinary tract infection)    frequent   Past Surgical History:  Procedure Laterality Date  . 2 harrington rods in back  1980  . ABDOMINAL HYSTERECTOMY     endometriosis; ovaries resected; no cancer.  [place] Anes  2006   both feet  . CATARACT EXTRACTION, BILATERAL    . [place]  . COLONOSCOPY WITH PROPOFOL N/A 11/21/2016   Procedure:  COLONOSCOPY WITH PROPOFOL;  Surgeon: Manya Silvas, MD;  Location: [facility] ENDOSCOPY;  Service: Endoscopy;  Laterality: N/A;  . ESOPHAGOGASTRODUODENOSCOPY N/A 11/21/2016   Procedure: ESOPHAGOGASTRODUODENOSCOPY (EGD);  Surgeon: Manya Silvas, MD;  Location: [facility] ENDOSCOPY;  Service: Endoscopy;  Laterality: N/A;  . EYE SURGERY    . JOINT REPLACEMENT  03/16/12   L TKR; R TKR  . lumbar spine surgery  [place]  . OTHER SURGICAL HISTORY  11/29   spinal stimulator removed   . squamous cell carcinoma resection  02/2010   [name]  . TONSILLECTOMY AND ADENOIDECTOMY  1964  . VAGINAL HYSTERECTOMY  1990   Allergies  Allergen Reactions  . Latex Other (See Comments)    OPEN SORES  . Levofloxacin   . Sulfonamide Derivatives    Current Outpatient Medications on File Prior to Visit  Medication Sig Dispense Refill  . albuterol (PROVENTIL HFA;VENTOLIN HFA) 108 (90 Base) MCG/ACT inhaler Inhale 2 puffs into the lungs every 6 (six) hours as needed. 3 Inhaler 1  . baclofen (LIORESAL) 10 MG tablet Take 0.5 tablets (5 mg total) by mouth 2 (two) times daily. 60 tablet 0  . clonazePAM (KLONOPIN) 0.5  MG tablet Take 1 tablet (0.5 mg total) by mouth 3 (three) times daily as needed for anxiety. 270 tablet 1  . diltiazem (CARDIZEM CD) 240 MG 24 hr capsule Take 1 capsule (240 mg total) by mouth daily. 90 capsule 3  . DULoxetine (CYMBALTA) 60 MG capsule Take 1 capsule (60 mg total) by mouth daily. 90 capsule 1  . ferrous sulfate 325 (65 FE) MG EC tablet Take 1 tablet (325 mg total) by mouth 3 (three) times daily with meals. 90 tablet 3  . fexofenadine (ALLEGRA) 180 MG tablet Take 1 tablet (180 mg total) by mouth daily. 90 tablet 3  . fluticasone (FLONASE) 50 MCG/ACT nasal spray Place 2 sprays into both nostrils daily. 48 g 3  . furosemide (LASIX) 40 MG tablet Take 40 mg by mouth as needed.    Marland Kitchen levothyroxine (SYNTHROID) 50 MCG tablet Take 1 tablet (50 mcg total) by mouth daily before breakfast. 90 tablet 3   . montelukast (SINGULAIR) 10 MG tablet Take 10 mg by mouth at bedtime.  11  . OXYGEN Inhale 2 L into the lungs at bedtime.    . pantoprazole (PROTONIX) 40 MG tablet Take 1 tablet (40 mg total) by mouth daily at 12 noon. 90 tablet 3  . potassium chloride SA (K-DUR,KLOR-CON) 20 MEQ tablet Take 1 tablet (20 mEq total) by mouth daily. 90 tablet 1  . rivaroxaban (XARELTO) 20 MG TABS tablet Take 1 tablet (20 mg total) by mouth daily with supper. 90 tablet 3  . rosuvastatin (CRESTOR) 20 MG tablet Take 1 tablet (20 mg total) by mouth daily. 90 tablet 3  . traMADol (ULTRAM) 50 MG tablet Take 100 mg by mouth every 6 (six) hours as needed.    . zolpidem (AMBIEN CR) 12.5 MG CR tablet Take 1 tablet (12.5 mg total) by mouth at bedtime as needed for sleep. 90 tablet 1   Current Facility-Administered Medications on File Prior to Visit  Medication Dose Route Frequency Provider Last Rate Last Dose  . cyanocobalamin ((VITAMIN B-12)) injection 1,000 mcg  1,000 mcg Intramuscular Q30 days Wardell Honour, MD   1,000 mcg at 03/04/17 1251   Social History   Socioeconomic History  . Marital status: Married    Spouse name: Denys Labree  . Number of children: 1  . Years of education: 21  . Highest education level: Not on file  Social Needs  . Financial resource strain: Not on file  . Food insecurity - worry: Not on file  . Food insecurity - inability: Not on file  . Transportation needs - medical: Not on file  . Transportation needs - non-medical: Not on file  Occupational History  . Occupation: disabled    Comment: 1993   DDD  Tobacco Use  . Smoking status: Former Smoker    Packs/day: 1.00    Years: 25.00    Pack years: 25.00    Types: Cigarettes    Start date: 11/05/1984    Last attempt to quit: 11/06/2006    Years since quitting: 10.3  . Smokeless tobacco: Never Used  . Tobacco comment: quit 11/07  Substance and Sexual Activity  . Alcohol use: No    Alcohol/week: 0.0 oz  . Drug use: No  .  Sexual activity: Yes    Partners: Male  Other Topics Concern  . Not on file  Social History Narrative   Marital status:  Married since 1992; second marriage; happily married; no abuse.      Children:  1  daughter; 3 stepchildren; 8 grandchildren; 2 gg.   Daughter in [place].  Husband's children in [place].      Employment:  Retired/disabled due to DDD lumbar.       Lives:   Lives with spouse;      Caffeine use cosumes a moderate amount 1 serving per day.      Always uses seat belts.       No guns in home.       Smoke alarm and carbon monoxide detectors in the home.       No Living Will.   Family History  Problem Relation Age of Onset  . Coronary artery disease Brother        stents    SIster stents also  . Heart disease Brother   . Heart failure Brother        CABG  AMI @ 54  . Heart disease Brother   . Heart failure Mother        pacemaker  . Hypertension Mother   . Heart disease Mother        CHF; pacemaker  . Heart failure Father        AMI  . Cancer Father 63       colon cancer  . Heart disease Father        mutliple AMIs  . Cancer Maternal Grandmother        ovarian  . Heart disease Sister 53       cardiac stenting  . Heart disease Brother   . Heart disease Brother   . GI problems Unknown        Family history malignancy, GI tract  . Breast cancer Cousin        Objective:    BP 110/86   Pulse 84   Temp 98.5 F (36.9 C)   Resp (!) 24   Ht 5' 0.83" (1.545 m)   Wt 182 lb 3.2 oz (82.6 kg)   SpO2 95%   BMI 34.62 kg/m  Physical Exam  Constitutional: She is oriented to person, place, and time. She appears well-developed and well-nourished. No distress.  HENT:  Head: Normocephalic and atraumatic.  Right Ear: External ear normal.  Left Ear: External ear normal.  Nose: Nose normal.  Mouth/Throat: Oropharynx is clear and moist.  Eyes: Conjunctivae and EOM are normal. Pupils are equal, round, and reactive to light.  Neck: Normal range of motion. Neck supple.  Carotid bruit is not present. No thyromegaly present.  Cardiovascular: Normal rate, regular rhythm, normal heart sounds and intact distal pulses. Exam reveals no gallop and no friction rub.  No murmur heard. Pulmonary/Chest: Effort normal and breath sounds normal. She has no wheezes. She has no rales.  Abdominal: Soft. Bowel sounds are normal. She exhibits no distension and no mass. There is no tenderness. There is no rebound and no guarding.  Lymphadenopathy:    She has no cervical adenopathy.  Neurological: She is alert and oriented to person, place, and time. No cranial nerve deficit. She exhibits normal muscle tone. Coordination normal.  Skin: Skin is warm and dry. No rash noted. She is not diaphoretic. No erythema. No pallor.  Psychiatric: She has a normal mood and affect. Her behavior is normal. Judgment and thought content normal.   No results found. Depression screen [facility] 2/9 03/04/2017 01/28/2017 12/31/2016 12/03/2016 07/30/2016  Decreased Interest 0 2 0 0 0  Down, Depressed, Hopeless 0 3 0 0 0  PHQ - 2 Score 0 5 0  0 0  Altered sleeping - 2 - - -  Tired, decreased energy - 3 - - -  Change in appetite - 3 - - -  Feeling bad or failure about yourself  - 0 - - -  Trouble concentrating - 0 - - -  Moving slowly or fidgety/restless - 0 - - -  Suicidal thoughts - 0 - - -  PHQ-9 Score - 13 - - -  Difficult doing work/chores - Somewhat difficult - - -   Fall Risk  03/04/2017 01/28/2017 12/31/2016 12/03/2016 07/30/2016  Falls in the past year? No No No No No  Number falls in past yr: - - - - -  Injury with [place]:   1. Other iron deficiency anemia   2. Pure hypercholesterolemia   3. Cough   4. Bloody sputum   5. Vitamin B12 deficiency   6. Drug-induced constipation   7. Acute recurrent maxillary sinusitis   8. Moderate persistent asthma with acute exacerbation   9. Marital conflict    Persistent and worsening cough with asthma  exacerbation.  Obtain chest x-ray due to his bloody sputum and CBC.  Prescription for Augmentin and prednisone provided to treat acute sinusitis and asthma exacerbation.  Having bloody nasal congestion as well, so bloody sputum may be secondary to postnasal drainage.  Iron deficiency anemia stable.  Repeat labs today.  Will adjust iron therapy based on hemoglobin level.  Patient continues to suffer with ongoing constipation.  Prescription for Linzess 290 mcg capsules provided.  Iron supplementation is contributing to worsening constipation.  Wean iron supplement when appropriate.  Cholesterol stable on current medication; no changes at this time.  Patient undergoing marital strain at this time.  Patient plans to separate from husband and move to [place] where her family is located.  Patient plans to meet in the upcoming 3-6 months.  Recommend follow-up visit in 3 months unless has departed to [place].  There is been a true pleasure to take care of this patient over the past 8 years.  I wished her the very best.   Orders Placed This Encounter  Procedures  . DG Chest 2 View    Standing Status:   Future    Number of Occurrences:   1    Standing Expiration Date:   03/04/2018    Order Specific Question:   Reason for Exam (SYMPTOM  OR DIAGNOSIS REQUIRED)    Answer:   cough, asthma, bloody sputum    Order Specific Question:   Preferred imaging location?    Answer:   External  . CBC with Differential/Platelet  . Comprehensive metabolic panel  . Iron  . POC Influenza A&B(BINAX/QUICKVUE)   Meds ordered this encounter  Medications  . linaclotide (LINZESS) 290 MCG CAPS capsule    Sig: Take 1 capsule (290 mcg total) by mouth daily before breakfast.    Dispense:  90 capsule    Refill:  3  . amoxicillin-clavulanate (AUGMENTIN) 875-125 MG tablet    Sig: Take 1 tablet by mouth 2 (two) times daily.    Dispense:  20 tablet    Refill:  0  . predniSONE (DELTASONE) 20 MG tablet    Sig: Take 3 PO QAM x  1 day, 2 PO QAM x 5 days, 1 PO QAM x 5 days    Dispense:  18 tablet    Refill:  0    Return in about 3 months (around 06/02/2017) for follow-up chronic medical conditions.   Demario Faniel Elayne Guerin, M.D. Primary Care at [facility] previously [facility] [place] [address] [place],   [address] [phone] phone [phone] fax

## 2017-03-04 NOTE — Patient Instructions (Signed)
     IF you received an x-ray today, you will receive an invoice from [facility]. Please contact [facility] at [phone] with questions or concerns regarding your invoice.   IF you received labwork today, you will receive an invoice from [facility]. Please contact [facility] at [phone] with questions or concerns regarding your invoice.   Our billing staff will not be able to assist you with questions regarding bills from these companies.  You will be contacted with the lab results as soon as they are available. The fastest way to get your results is to activate your My Chart account. Instructions are located on the last page of this paperwork. If you have not heard from us regarding the results in 2 weeks, please contact this office.     

## 2017-03-05 LAB — CBC WITH DIFFERENTIAL/PLATELET
Basophils Absolute: 0 10*3/uL (ref 0.0–0.2)
Basos: 0 %
EOS (ABSOLUTE): 0.1 10*3/uL (ref 0.0–0.4)
EOS: 1 %
HEMATOCRIT: 39.8 % (ref 34.0–46.6)
HEMOGLOBIN: 12.3 g/dL (ref 11.1–15.9)
IMMATURE GRANS (ABS): 0 10*3/uL (ref 0.0–0.1)
Immature Granulocytes: 0 %
LYMPHS: 20 %
Lymphocytes Absolute: 2 10*3/uL (ref 0.7–3.1)
MCH: 24.9 pg — AB (ref 26.6–33.0)
MCHC: 30.9 g/dL — ABNORMAL LOW (ref 31.5–35.7)
MCV: 81 fL (ref 79–97)
MONOCYTES: 9 %
Monocytes Absolute: 0.9 10*3/uL (ref 0.1–0.9)
NEUTROS ABS: 6.8 10*3/uL (ref 1.4–7.0)
Neutrophils: 70 %
Platelets: 388 10*3/uL — ABNORMAL HIGH (ref 150–379)
RBC: 4.94 x10E6/uL (ref 3.77–5.28)
RDW: 20 % — ABNORMAL HIGH (ref 12.3–15.4)
WBC: 9.8 10*3/uL (ref 3.4–10.8)

## 2017-03-05 LAB — COMPREHENSIVE METABOLIC PANEL
ALBUMIN: 4.2 g/dL (ref 3.6–4.8)
ALT: 12 IU/L (ref 0–32)
AST: 21 IU/L (ref 0–40)
Albumin/Globulin Ratio: 1.4 (ref 1.2–2.2)
Alkaline Phosphatase: 114 IU/L (ref 39–117)
BILIRUBIN TOTAL: 0.4 mg/dL (ref 0.0–1.2)
BUN/Creatinine Ratio: 11 — ABNORMAL LOW (ref 12–28)
BUN: 9 mg/dL (ref 8–27)
CO2: 24 mmol/L (ref 20–29)
CREATININE: 0.85 mg/dL (ref 0.57–1.00)
Calcium: 9.1 mg/dL (ref 8.7–10.3)
Chloride: 99 mmol/L (ref 96–106)
GFR calc non Af Amer: 72 mL/min/{1.73_m2} (ref >59)
GFR, EST AFRICAN AMERICAN: 83 mL/min/{1.73_m2} (ref >59)
GLOBULIN, TOTAL: 2.9 g/dL (ref 1.5–4.5)
GLUCOSE: 80 mg/dL (ref 65–99)
Potassium: 4.1 mmol/L (ref 3.5–5.2)
Sodium: 140 mmol/L (ref 134–144)
TOTAL PROTEIN: 7.1 g/dL (ref 6.0–8.5)

## 2017-03-05 LAB — IRON: Iron: 33 ug/dL (ref 27–139)

## 2017-03-11 ENCOUNTER — Ambulatory Visit: Payer: Medicare Other | Admitting: Family Medicine

## 2017-03-26 DIAGNOSIS — J4520 Mild intermittent asthma, uncomplicated: Secondary | ICD-10-CM | POA: Diagnosis not present

## 2017-03-26 DIAGNOSIS — R0609 Other forms of dyspnea: Secondary | ICD-10-CM | POA: Diagnosis not present

## 2017-04-01 DIAGNOSIS — M47817 Spondylosis without myelopathy or radiculopathy, lumbosacral region: Secondary | ICD-10-CM | POA: Diagnosis not present

## 2017-04-01 DIAGNOSIS — Z79891 Long term (current) use of opiate analgesic: Secondary | ICD-10-CM | POA: Diagnosis not present

## 2017-04-01 DIAGNOSIS — M961 Postlaminectomy syndrome, not elsewhere classified: Secondary | ICD-10-CM | POA: Diagnosis not present

## 2017-04-01 DIAGNOSIS — G894 Chronic pain syndrome: Secondary | ICD-10-CM | POA: Diagnosis not present

## 2017-04-01 DIAGNOSIS — M79606 Pain in leg, unspecified: Secondary | ICD-10-CM | POA: Diagnosis not present

## 2017-04-01 DIAGNOSIS — Z79899 Other long term (current) drug therapy: Secondary | ICD-10-CM | POA: Diagnosis not present

## 2017-04-21 ENCOUNTER — Encounter: Payer: Self-pay | Admitting: Cardiovascular Disease

## 2017-04-21 ENCOUNTER — Ambulatory Visit (INDEPENDENT_AMBULATORY_CARE_PROVIDER_SITE_OTHER): Payer: Medicare HMO | Admitting: Cardiovascular Disease

## 2017-04-21 VITALS — BP 112/70 | HR 88 | Ht 62.0 in | Wt 179.0 lb

## 2017-04-21 DIAGNOSIS — I480 Paroxysmal atrial fibrillation: Secondary | ICD-10-CM | POA: Diagnosis not present

## 2017-04-21 NOTE — Progress Notes (Signed)
Cardiology Office Note  Date:  04/21/2017   ID:  Sonya [name], DOB [birth year], MRN [id number]  PCP:  Wardell Honour, MD   Chief Complaint  Patient presents with  . Other    12 month follow up. Patient denies chest pain and SOB. Patient states this will be her last visit with [place] due to her moving to [place]. Meds reviewed verbally with patient.     HPI:  Sonya [name] is a 67 year old woman with a history of Paroxysmal atrial fibrillation in the setting of narcotic overdose  09/12/2015 smoking who stopped more than 4 years ago  chronic back pain with Harrington rods placed, placed 1980 spinal stimulator for pain anemia with iron deficiency,  emphysema seen on CT scan,  pneumonia x2 in May and August 2011,  symptoms of swelling in her legs, total knee replacements bilaterally in 2014,  workup with echocardiogram and stress test, previous symptoms of fatigue,  shortness of breath and edema.  H/o depression Previous stress test 04/2013 She presents today for followup after episodes of chest pain and atrial fibrillation.  In follow-up today she reports that she feels well No significant atrial fib Previous atrial fib had "heart burn" No heartburn symptoms recently  Tolerating diltiazem and Xarelto  Difficulty losing weight No regular exercise program secondary to chronic back pain  EKG  personally reviewed by myself showing  normal sinus rhythm with rate of 85 beats per minute, no significant ST-T wave changes  Other past medical history reviewed On prior office visit she reported Rare episodes of atrial fib, Lasts <45 min 1 episode came on when she was having trouble with GERD  Previous fall, broke rod in her back Saw orthopedics, did not want to fix it (too many complications )  Admission to hospital for narcoic overdose: 09/10/2015 Found by husband CXR with large right side infiultrate Given lasix, ABX, aspiated,  Diagnosed with atrial fibrillation  09/12/2015 NSR on 10/02/2015 On xarelto   Echo 09/11/2015: EF 50 to 55% RVSP 40  Mg  Previous labs reviewed personally by myself and with the patient on todays visit Total 155, LDL 88 HBA1C 5.5   stress test September 26 2009 showed no ischemia, poor exercise tolerance for her age, ejection fraction 71%   Echocardiogram done June 2011 shows normal systolic function, mild MR, normal right systolic function and pressure     PMH:   has a past medical history of Allergic rhinitis, cause unspecified, Anemia, Anxiety, Arthritis, Benign neoplasm of colon, Bruising, Chest pain, Chronic pain syndrome, Degeneration of lumbar or lumbosacral intervertebral disc, Depressive disorder, not elsewhere classified, Edema, Emphysema, GERD (gastroesophageal reflux disease), Hematuria, unspecified, Hiatal hernia, HLD (hyperlipidemia), Hyperglycemia, Insomnia, unspecified, Lumbosacral spondylosis without myelopathy, Neurotic excoriations, Osteoporosis, unspecified, PAF (paroxysmal atrial fibrillation) ([place]), Personal history of urinary calculi, Pneumonia, Prurigo nodularis, Pulmonary hypertension ([place]), Pure hypercholesterolemia, Scoliosis (and kyphoscoliosis), idiopathic, Squamous cell carcinoma, Tobacco use disorder, Unspecified hypothyroidism, Unspecified vitamin D deficiency, and UTI (urinary tract infection).  PSH:    Past Surgical History:  Procedure Laterality Date  . 2 harrington rods in back  1980  . ABDOMINAL HYSTERECTOMY     endometriosis; ovaries resected; no cancer.  Sonya [name]  2006   both feet  . CATARACT EXTRACTION, BILATERAL    . [place]  . COLONOSCOPY WITH PROPOFOL N/A 11/21/2016   Procedure: COLONOSCOPY WITH PROPOFOL;  Surgeon: Manya Silvas, MD;  Location: [facility]COPY;  Service: Endoscopy;  Laterality: N/A;  .  ESOPHAGOGASTRODUODENOSCOPY N/A 11/21/2016   Procedure: ESOPHAGOGASTRODUODENOSCOPY (EGD);  Surgeon: Manya Silvas, MD;  Location[facility]  ENDOSCOPY;  Service: Endoscopy;  Laterality: N/A;  . EYE SURGERY    . JOINT REPLACEMENT  03/16/12   L TKR; R TKR  . lumbar spine surgery  [place]  . OTHER SURGICAL HISTORY  11/29   spinal stimulator removed   . squamous cell carcinoma resection  02/2010   [name]  . TONSILLECTOMY AND ADENOIDECTOMY  1964  . VAGINAL HYSTERECTOMY  1990    Current Outpatient Medications  Medication Sig Dispense Refill  . albuterol (PROVENTIL HFA;VENTOLIN HFA) 108 (90 Base) MCG/ACT inhaler Inhale 2 puffs into the lungs every 6 (six) hours as needed. 3 Inhaler 1  . amoxicillin-clavulanate (AUGMENTIN) 875-125 MG tablet Take 1 tablet by mouth 2 (two) times daily. 20 tablet 0  . baclofen (LIORESAL) 10 MG tablet Take 0.5 tablets (5 mg total) by mouth 2 (two) times daily. 60 tablet 0  . clonazePAM (KLONOPIN) 0.5 MG tablet Take 1 tablet (0.5 mg total) by mouth 3 (three) times daily as needed for anxiety. 270 tablet 1  . diltiazem (CARDIZEM CD) 240 MG 24 hr capsule Take 1 capsule (240 mg total) by mouth daily. 90 capsule 3  . DULoxetine (CYMBALTA) 60 MG capsule Take 1 capsule (60 mg total) by mouth daily. 90 capsule 1  . ferrous sulfate 325 (65 FE) MG EC tablet Take 1 tablet (325 mg total) by mouth 3 (three) times daily with meals. 90 tablet 3  . fexofenadine (ALLEGRA) 180 MG tablet Take 1 tablet (180 mg total) by mouth daily. 90 tablet 3  . fluticasone (FLONASE) 50 MCG/ACT nasal spray Place 2 sprays into both nostrils daily. 48 g 3  . furosemide (LASIX) 40 MG tablet Take 40 mg by mouth as needed.    Marland Kitchen levothyroxine (SYNTHROID) 50 MCG tablet Take 1 tablet (50 mcg total) by mouth daily before breakfast. 90 tablet 3  . linaclotide (LINZESS) 290 MCG CAPS capsule Take 1 capsule (290 mcg total) by mouth daily before breakfast. 90 capsule 3  . montelukast (SINGULAIR) 10 MG tablet Take 10 mg by mouth at bedtime.  11  . OXYGEN Inhale 2 L into the lungs at bedtime.    . pantoprazole (PROTONIX) 40 MG tablet Take 1  tablet (40 mg total) by mouth daily at 12 noon. 90 tablet 3  . potassium chloride SA (K-DUR,KLOR-CON) 20 MEQ tablet Take 1 tablet (20 mEq total) by mouth daily. 90 tablet 1  . predniSONE (DELTASONE) 20 MG tablet Take 3 PO QAM x 1 day, 2 PO QAM x 5 days, 1 PO QAM x 5 days 18 tablet 0  . rivaroxaban (XARELTO) 20 MG TABS tablet Take 1 tablet (20 mg total) by mouth daily with supper. 90 tablet 3  . rosuvastatin (CRESTOR) 20 MG tablet Take 1 tablet (20 mg total) by mouth daily. 90 tablet 3  . traMADol (ULTRAM) 50 MG tablet Take 100 mg by mouth every 6 (six) hours as needed.    . zolpidem (AMBIEN CR) 12.5 MG CR tablet Take 1 tablet (12.5 mg total) by mouth at bedtime as needed for sleep. 90 tablet 1   Current Facility-Administered Medications  Medication Dose Route Frequency Provider Last Rate Last Dose  . cyanocobalamin ((VITAMIN B-12)) injection 1,000 mcg  1,000 mcg Intramuscular Q30 days Wardell Honour, MD   1,000 mcg at 03/04/17 1251     Allergies:   Latex; Levofloxacin; and Sulfonamide derivatives  Social History:  The patient  reports that she quit smoking about 10 years ago. Her smoking use included cigarettes. She started smoking about 32 years ago. She has a 25.00 pack-year smoking history. she has never used smokeless tobacco. She reports that she does not drink alcohol or use drugs.   Family History:   family history includes Breast cancer in her cousin; Cancer in her maternal grandmother; Cancer (age of onset: 68) in her father; Coronary artery disease in her brother; GI problems in her unknown relative; Heart disease in her brother, brother, brother, brother, father, and mother; Heart disease (age of onset: 56) in her sister; Heart failure in her brother, father, and mother; Hypertension in her mother.    Review of Systems: Review of Systems  Constitutional: Negative.   Respiratory: Negative.   Cardiovascular: Negative.   Gastrointestinal: Negative.   Musculoskeletal: Negative.    Psychiatric/Behavioral: Negative.   All other systems reviewed and are negative.    PHYSICAL EXAM: VS:  BP 112/70 (BP Location: Left Arm, Patient Position: Sitting, Cuff Size: Normal)   Pulse 88   Ht 5\' 2"  (1.575 m)   Wt 179 lb (81.2 kg)   BMI 32.74 kg/m  , BMI Body mass index is 32.74 kg/m. Constitutional:  oriented to person, place, and time. No distress. Obese HENT:  Head: Normocephalic and atraumatic.  Eyes:  no discharge. No scleral icterus.  Neck: Normal range of motion. Neck supple. No JVD present.  Cardiovascular: Normal rate, regular rhythm, normal heart sounds and intact distal pulses. Exam reveals no gallop and no friction rub. No edema No murmur heard. Pulmonary/Chest: Effort normal and breath sounds normal. No stridor. No respiratory distress.  no wheezes.  no rales.  no tenderness.  Abdominal: Soft.  no distension.  no tenderness.  Musculoskeletal: Normal range of motion.  no  tenderness or deformity.  Neurological:  normal muscle tone. Coordination normal. No atrophy Skin: Skin is warm and dry. No rash noted. not diaphoretic.  Psychiatric:  normal mood and affect. behavior is normal. Thought content normal.    Recent Labs: 12/03/2016: TSH 2.070 03/04/2017: ALT 12; BUN 9; Creatinine, Ser 0.85; Hemoglobin 12.3; Platelets 388; Potassium 4.1; Sodium 140    Lipid Panel Lab Results  Component Value Date   CHOL 142 12/03/2016   HDL 48 12/03/2016   LDLCALC 78 12/03/2016   TRIG 82 12/03/2016      Wt Readings from Last 3 Encounters:  04/21/17 179 lb (81.2 kg)  03/04/17 182 lb 3.2 oz (82.6 kg)  01/28/17 189 lb (85.7 kg)       ASSESSMENT AND PLAN:   Paroxysmal atrial fibrillation ([place]) - Plan: EKG 12-Lead Maintaining normal sinus rhythm Tolerating anticoagulation and Cardizem No medication changes made Reports that she is moving to [place] We will refill her medications, she will establish with cardiology  Pure hypercholesterolemia Cholesterol is at  goal on the current lipid regimen. No changes to the medications were made.  Stable  Spondylosis of thoracic region without myelopathy or radiculopathy Chronic back pain, unable to exercise, gait instability  Chronic obstructive pulmonary disease, unspecified COPD type ([place])  stable, no recent exacerbation, on inhalers  Morbid obesity Recommended continued exercise, low carbohydrate diet   Total encounter time more than 25 minutes  Greater than 50% was spent in counseling and coordination of care with the patient  Disposition:   F/U  12 months   Orders Placed This Encounter  Procedures  . EKG 12-Lead     Signed,  Esmond Plants, M.D., Ph.D. 04/21/2017  [place], [place]

## 2017-04-21 NOTE — Patient Instructions (Signed)

## 2017-04-24 ENCOUNTER — Ambulatory Visit: Payer: BLUE CROSS/BLUE SHIELD | Admitting: Cardiovascular Disease

## 2017-04-29 DIAGNOSIS — M961 Postlaminectomy syndrome, not elsewhere classified: Secondary | ICD-10-CM | POA: Diagnosis not present

## 2017-04-29 DIAGNOSIS — M79606 Pain in leg, unspecified: Secondary | ICD-10-CM | POA: Diagnosis not present

## 2017-04-29 DIAGNOSIS — G894 Chronic pain syndrome: Secondary | ICD-10-CM | POA: Diagnosis not present

## 2017-04-29 DIAGNOSIS — M47817 Spondylosis without myelopathy or radiculopathy, lumbosacral region: Secondary | ICD-10-CM | POA: Diagnosis not present

## 2017-05-05 ENCOUNTER — Encounter: Payer: Self-pay | Admitting: Psychiatry

## 2017-05-05 ENCOUNTER — Other Ambulatory Visit: Payer: Self-pay

## 2017-05-05 ENCOUNTER — Ambulatory Visit (INDEPENDENT_AMBULATORY_CARE_PROVIDER_SITE_OTHER): Payer: Medicare HMO | Admitting: Psychiatry

## 2017-05-05 VITALS — BP 108/70 | HR 91 | Temp 98.3°F | Wt 184.8 lb

## 2017-05-05 DIAGNOSIS — F411 Generalized anxiety disorder: Secondary | ICD-10-CM | POA: Diagnosis not present

## 2017-05-05 DIAGNOSIS — G4734 Idiopathic sleep related nonobstructive alveolar hypoventilation: Secondary | ICD-10-CM | POA: Diagnosis not present

## 2017-05-05 DIAGNOSIS — R69 Illness, unspecified: Secondary | ICD-10-CM | POA: Diagnosis not present

## 2017-05-05 DIAGNOSIS — F331 Major depressive disorder, recurrent, moderate: Secondary | ICD-10-CM

## 2017-05-05 DIAGNOSIS — J310 Chronic rhinitis: Secondary | ICD-10-CM | POA: Diagnosis not present

## 2017-05-05 DIAGNOSIS — J4520 Mild intermittent asthma, uncomplicated: Secondary | ICD-10-CM | POA: Diagnosis not present

## 2017-05-05 MED ORDER — DULOXETINE HCL 60 MG PO CPEP: 60.0000 mg | ORAL_CAPSULE | Freq: Every day | ORAL | 1 refills | Status: AC

## 2017-05-05 MED ORDER — CLONAZEPAM 0.5 MG PO TABS: 0.5000 mg | ORAL_TABLET | Freq: Three times a day (TID) | ORAL | 1 refills | Status: AC | PRN

## 2017-05-05 NOTE — Progress Notes (Signed)
Follow-up for this 67 year old woman with a history of depression and anxiety.  She tells me that at long last she is actually going to move to [place] next month.  Because of that she anticipates this will be the last time I will see her.  Generally she is feeling very upbeat.  No sign of depression.  Sleeping well.  Physical problems under good control.  Anxiety under good control.  Neatly groomed dressed and groomed.  Good eye contact normal psychomotor activity.  Speech normal rate tone and volume.  Affect euthymic thoughts lucid no sign of dangerousness.  Renew medicines for a total of 6 months.  Patient is planning to find a new provider up in the [place] area.  She knows she can get in touch with me sooner if needed.

## 2017-05-06 ENCOUNTER — Ambulatory Visit: Payer: Medicare HMO | Admitting: Family Medicine

## 2017-05-06 ENCOUNTER — Encounter: Payer: Self-pay | Admitting: Family Medicine

## 2017-05-06 VITALS — BP 118/68 | HR 65 | Temp 98.6°F | Resp 16 | Ht 62.0 in | Wt 180.6 lb

## 2017-05-06 DIAGNOSIS — E034 Atrophy of thyroid (acquired): Secondary | ICD-10-CM | POA: Diagnosis not present

## 2017-05-06 DIAGNOSIS — E2839 Other primary ovarian failure: Secondary | ICD-10-CM

## 2017-05-06 DIAGNOSIS — D500 Iron deficiency anemia secondary to blood loss (chronic): Secondary | ICD-10-CM | POA: Diagnosis not present

## 2017-05-06 DIAGNOSIS — Z23 Encounter for immunization: Secondary | ICD-10-CM

## 2017-05-06 DIAGNOSIS — E538 Deficiency of other specified B group vitamins: Secondary | ICD-10-CM | POA: Diagnosis not present

## 2017-05-06 DIAGNOSIS — Z1231 Encounter for screening mammogram for malignant neoplasm of breast: Secondary | ICD-10-CM | POA: Diagnosis not present

## 2017-05-06 DIAGNOSIS — R739 Hyperglycemia, unspecified: Secondary | ICD-10-CM | POA: Diagnosis not present

## 2017-05-06 DIAGNOSIS — E7800 Pure hypercholesterolemia, unspecified: Secondary | ICD-10-CM | POA: Diagnosis not present

## 2017-05-06 MED ORDER — CYANOCOBALAMIN 1000 MCG/ML IJ SOLN: 1000.0000 ug | INTRAMUSCULAR | Status: DC

## 2017-05-06 NOTE — Progress Notes (Signed)
Subjective:    Patient ID: Lauraine Rinne, female    DOB: Mar 08, 1950, 67 y.o.   MRN: [id number]  05/06/2017  Follow-up (moving, B12 injection, anemia)    HPI This 67 y.o. female presents for ONE MONTH follow-up of Vitamin B12 deficiency, iron deficiency, marital strain.  Management changes made at last visit include the following: -Persistent and worsening cough with asthma exacerbation.  Obtain chest x-ray due to his bloody sputum and CBC.  Prescription for Augmentin and prednisone provided to treat acute sinusitis and asthma exacerbation.  Having bloody nasal congestion as well, so bloody sputum may be secondary to postnasal drainage. -Iron deficiency anemia stable.  Repeat labs today.  Will adjust iron therapy based on hemoglobin level. -Patient continues to suffer with ongoing constipation.  Prescription for Linzess 290 mcg capsules provided.  Iron supplementation is contributing to worsening constipation.  Wean iron supplement when appropriate. -Cholesterol stable on current medication; no changes at this time. -Patient undergoing marital strain at this time.  Patient plans to separate from husband and move to [place] where her family is located.  Patient plans to meet in the upcoming 3-6 months.  Recommend follow-up visit in 3 months unless has departed to [place].  There is been a true pleasure to take care of this patient over the past 8 years.  I wished her the very best.   Stable at this time; moving to [place] in the upcoming two months.  Presenting for repeat labs and repeat B12 injection.  S/p follow up with pain management.  Very stressful time yet very hopeful to return to [place] to be with family.   BP Readings from Last 3 Encounters:  05/06/17 118/68  04/21/17 112/70  03/04/17 110/86   Wt Readings from Last 3 Encounters:  05/06/17 180 lb 9.6 oz (81.9 kg)  04/21/17 179 lb (81.2 kg)  03/04/17 182 lb 3.2 oz (82.6 kg)   Immunization History  Administered  Date(s) Administered  . Influenza Split 11/10/2011  . Influenza,inj,Quad PF,6+ Mos 12/20/2012, 10/26/2013, 12/01/2014, 01/02/2016, 12/03/2016  . Influenza-Unspecified 11/14/2009, 12/25/2010  . Pneumococcal Polysaccharide-23 01/02/2016  . Pneumococcal-Unspecified 12/18/2009  . Tdap 12/25/2010  . Zoster 02/24/2014    Review of Systems  Constitutional: Negative for chills, diaphoresis, fatigue and fever.  Eyes: Negative for visual disturbance.  Respiratory: Negative for cough, shortness of breath, wheezing and stridor.   Cardiovascular: Negative for chest pain, palpitations and leg swelling.  Gastrointestinal: Negative for abdominal pain, constipation, diarrhea, nausea and vomiting.  Endocrine: Negative for cold intolerance, heat intolerance, polydipsia, polyphagia and polyuria.  Musculoskeletal: Positive for back pain.  Neurological: Negative for dizziness, tremors, seizures, syncope, facial asymmetry, speech difficulty, weakness, light-headedness, numbness and headaches.  Psychiatric/Behavioral: Positive for dysphoric mood. Negative for self-injury, sleep disturbance and suicidal ideas. The patient is nervous/anxious.     Past Medical History:  Diagnosis Date  . Allergic rhinitis, cause unspecified   . Anemia   . Anxiety   . Arthritis   . Benign neoplasm of colon   . Bruising   . Chest pain    a. 2015 Neg MV.  . Chronic pain syndrome   . Degeneration of lumbar or lumbosacral intervertebral disc   . Depressive disorder, not elsewhere classified   . Edema   . Emphysema   . GERD (gastroesophageal reflux disease)   . Hematuria, unspecified   . Hiatal hernia   . HLD (hyperlipidemia)   . Hyperglycemia   . Insomnia, unspecified   . Lumbosacral spondylosis without myelopathy   .  Neurotic excoriations   . Osteoporosis, unspecified   . PAF (paroxysmal atrial fibrillation) ([place])    a. 08/2015 in setting of asp pna-->converted on dilt. Xarelto started (CHA2DS2VASc= 1).  . Personal  history of urinary calculi   . Pneumonia   . Prurigo nodularis   . Pulmonary hypertension ([place])    a. 08/2015 Echo: EF 50-55%, no rwma, gr1 DD, mild MR, PASP 88mmHg.   Marland Kitchen Pure hypercholesterolemia   . Scoliosis (and kyphoscoliosis), idiopathic   . Squamous cell carcinoma   . Tobacco use disorder   . Unspecified hypothyroidism   . Unspecified vitamin D deficiency   . UTI (urinary tract infection)    frequent   Past Surgical History:  Procedure Laterality Date  . 2 harrington rods in back  1980  . ABDOMINAL HYSTERECTOMY     endometriosis; ovaries resected; no cancer.  [place] Anes  2006   both feet  . CATARACT EXTRACTION, BILATERAL    . [place]  . COLONOSCOPY WITH PROPOFOL N/A 11/21/2016   Procedure: COLONOSCOPY WITH PROPOFOL;  Surgeon: Manya Silvas, MD;  Location[facility] ENDOSCOPY;  Service: Endoscopy;  Laterality: N/A;  . ESOPHAGOGASTRODUODENOSCOPY N/A 11/21/2016   Procedure: ESOPHAGOGASTRODUODENOSCOPY (EGD);  Surgeon: Manya Silvas, MD;  Location: [facility] ENDOSCOPY;  Service: Endoscopy;  Laterality: N/A;  . EYE SURGERY    . JOINT REPLACEMENT  03/16/12   L TKR; R TKR  . lumbar spine surgery  [place]  . OTHER SURGICAL HISTORY  11/29   spinal stimulator removed   . squamous cell carcinoma resection  02/2010   [name]  . TONSILLECTOMY AND ADENOIDECTOMY  1964  . VAGINAL HYSTERECTOMY  1990   Allergies  Allergen Reactions  . Latex Other (See Comments)    OPEN SORES  . Levofloxacin   . Sulfonamide Derivatives    Current Outpatient Medications on File Prior to Visit  Medication Sig Dispense Refill  . albuterol (PROVENTIL HFA;VENTOLIN HFA) 108 (90 Base) MCG/ACT inhaler Inhale 2 puffs into the lungs every 6 (six) hours as needed. 3 Inhaler 1  . clonazePAM (KLONOPIN) 0.5 MG tablet Take 1 tablet (0.5 mg total) by mouth 3 (three) times daily as needed for anxiety. 270 tablet 1  . diltiazem (CARDIZEM CD) 240 MG 24 hr capsule Take 1  capsule (240 mg total) by mouth daily. 90 capsule 3  . DULoxetine (CYMBALTA) 60 MG capsule Take 1 capsule (60 mg total) by mouth daily. 90 capsule 1  . ferrous sulfate 325 (65 FE) MG EC tablet Take 1 tablet (325 mg total) by mouth 3 (three) times daily with meals. 90 tablet 3  . fexofenadine (ALLEGRA) 180 MG tablet Take 1 tablet (180 mg total) by mouth daily. 90 tablet 3  . fluticasone (FLONASE) 50 MCG/ACT nasal spray Place 2 sprays into both nostrils daily. 48 g 3  . furosemide (LASIX) 40 MG tablet Take 40 mg by mouth as needed.    Marland Kitchen levothyroxine (SYNTHROID) 50 MCG tablet Take 1 tablet (50 mcg total) by mouth daily before breakfast. 90 tablet 3  . linaclotide (LINZESS) 290 MCG CAPS capsule Take 1 capsule (290 mcg total) by mouth daily before breakfast. 90 capsule 3  . montelukast (SINGULAIR) 10 MG tablet Take 10 mg by mouth at bedtime.  11  . OXYGEN Inhale 2 L into the lungs at bedtime.    . pantoprazole (PROTONIX) 40 MG tablet Take 1 tablet (40 mg total) by mouth daily at 12 noon.  90 tablet 3  . potassium chloride SA (K-DUR,KLOR-CON) 20 MEQ tablet Take 1 tablet (20 mEq total) by mouth daily. 90 tablet 1  . predniSONE (DELTASONE) 20 MG tablet Take 3 PO QAM x 1 day, 2 PO QAM x 5 days, 1 PO QAM x 5 days 18 tablet 0  . rivaroxaban (XARELTO) 20 MG TABS tablet Take 1 tablet (20 mg total) by mouth daily with supper. 90 tablet 3  . rosuvastatin (CRESTOR) 20 MG tablet Take 1 tablet (20 mg total) by mouth daily. 90 tablet 3  . traMADol (ULTRAM) 50 MG tablet Take 100 mg by mouth every 6 (six) hours as needed.    . zolpidem (AMBIEN CR) 12.5 MG CR tablet Take 1 tablet (12.5 mg total) by mouth at bedtime as needed for sleep. 90 tablet 1   No current facility-administered medications on file prior to visit.    Social History   Socioeconomic History  . Marital status: Married    Spouse name: Ly Wass  . Number of children: 1  . Years of education: 14  . Highest education level: Not on file    Social Needs  . Financial resource strain: Not on file  . Food insecurity - worry: Not on file  . Food insecurity - inability: Not on file  . Transportation needs - medical: Not on file  . Transportation needs - non-medical: Not on file  Occupational History  . Occupation: disabled    Comment: 1993   DDD  Tobacco Use  . Smoking status: Former Smoker    Packs/day: 1.00    Years: 25.00    Pack years: 25.00    Types: Cigarettes    Start date: 11/05/1984    Last attempt to quit: 11/06/2006    Years since quitting: 10.5  . Smokeless tobacco: Never Used  . Tobacco comment: quit 11/07  Substance and Sexual Activity  . Alcohol use: No    Alcohol/week: 0.0 oz  . Drug use: No  . Sexual activity: Yes    Partners: Male  Other Topics Concern  . Not on file  Social History Narrative   Marital status:  Married since 1992; second marriage; happily married; no abuse.      Children:  1 daughter; 3 stepchildren; 8 grandchildren; 2 gg.   Daughter in [place].  Husband's children in [place].      Employment:  Retired/disabled due to DDD lumbar.       Lives:   Lives with spouse;      Caffeine use cosumes a moderate amount 1 serving per day.      Always uses seat belts.       No guns in home.       Smoke alarm and carbon monoxide detectors in the home.       No Living Will.   Family History  Problem Relation Age of Onset  . Coronary artery disease Brother        stents    SIster stents also  . Heart disease Brother   . Heart failure Brother        CABG  AMI @ 52  . Heart disease Brother   . Heart failure Mother        pacemaker  . Hypertension Mother   . Heart disease Mother        CHF; pacemaker  . Heart failure Father        AMI  . Cancer Father 41       colon  cancer  . Heart disease Father        mutliple AMIs  . Cancer Maternal Grandmother        ovarian  . Heart disease Sister 83       cardiac stenting  . Heart disease Brother   . Heart disease Brother   . GI problems Unknown         Family history malignancy, GI tract  . Breast cancer Cousin        Objective:    BP 118/68   Pulse 65   Temp 98.6 F (37 C)   Resp 16   Ht 5\' 2"  (1.575 m)   Wt 180 lb 9.6 oz (81.9 kg)   SpO2 98%   BMI 33.03 kg/m  Physical Exam  Constitutional: She is oriented to person, place, and time. She appears well-developed and well-nourished. No distress.  HENT:  Head: Normocephalic and atraumatic.  Right Ear: External ear normal.  Left Ear: External ear normal.  Nose: Nose normal.  Mouth/Throat: Oropharynx is clear and moist.  Eyes: Conjunctivae and EOM are normal. Pupils are equal, round, and reactive to light.  Neck: Normal range of motion. Neck supple. Carotid bruit is not present. No thyromegaly present.  Cardiovascular: Normal rate, regular rhythm, normal heart sounds and intact distal pulses. Exam reveals no gallop and no friction rub.  No murmur heard. Pulmonary/Chest: Effort normal and breath sounds normal. She has no wheezes. She has no rales.  Abdominal: Soft. Bowel sounds are normal. She exhibits no distension and no mass. There is no tenderness. There is no rebound and no guarding.  Lymphadenopathy:    She has no cervical adenopathy.  Neurological: She is alert and oriented to person, place, and time. No cranial nerve deficit.  Skin: Skin is warm and dry. No rash noted. She is not diaphoretic. No erythema. No pallor.  Psychiatric: She has a normal mood and affect. Her behavior is normal.   No results found. Depression screen [facility] 2/9 05/06/2017 03/04/2017 01/28/2017 12/31/2016 12/03/2016  Decreased Interest 0 0 2 0 0  Down, Depressed, Hopeless 0 0 3 0 0  PHQ - 2 Score 0 0 5 0 0  Altered sleeping - - 2 - -  Tired, decreased energy - - 3 - -  Change in appetite - - 3 - -  Feeling bad or failure about yourself  - - 0 - -  Trouble concentrating - - 0 - -  Moving slowly or fidgety/restless - - 0 - -  Suicidal thoughts - - 0 - -  PHQ-9 Score - - 13 - -  Difficult  doing work/chores - - Somewhat difficult - -   Fall Risk  05/06/2017 03/04/2017 01/28/2017 12/31/2016 12/03/2016  Falls in the past year? No No No No No  Number falls in past yr: - - - - -  Injury with [place]:   1. Vitamin B12 deficiency   2. Hypothyroidism due to acquired atrophy of thyroid   3. Hyperglycemia   4. Iron deficiency anemia due to chronic blood loss   5. Pure hypercholesterolemia   6. Need for prophylactic vaccination against Streptococcus pneumoniae (pneumococcus)   7. Encounter for screening mammogram for breast cancer   8. Estrogen deficiency     Vitamin B12 deficiency: s/p B12 injection in office. Obtain labs.  Hypothyroidism: stable.  No changes to therapy.  Hyperglycemia:  I recommend weight loss, exercise, and low-carbohydrate low-sugar food choices. You should AVOID: regular sodas, sweetened tea, fruit juices.  You should LIMIT: breads, pastas, rice, potatoes, and desserts/sweets.  I would recommend limiting your total carbohydrate intake per meal to 45 grams; I would limit your total carbohydrate intake per snack to 30 grams.  I would also have a goal of 60 grams of protein intake per day; this would equal 10-15 grams of protein per meal and 5-10 grams of protein per snack. Iron deficiency anemia: stable. Repeat CBC today. Hypercholesterolemia: stable; obtain labs; continue current medications. Anxiety and depression: with ongoing marital strain; followed by Dr. Weber Cooks of psychiatry.  S/p recent follow-up visit on 05/05/17. Asthma: stable; recent follow-up with pulmonology on 05/05/17.   Atrial fibrillation: stable; s/p follow-up with Dr.Gollan in February 2019.  Orders Placed This Encounter  Procedures  . MM Digital Screening    Standing Status:   Future    Standing Expiration Date:   07/07/2018    Order Specific Question:   Reason for Exam (SYMPTOM  OR DIAGNOSIS REQUIRED)    Answer:   annual screening     Order Specific Question:   Preferred imaging location?    Answer:   [facility]  . DG Bone Density    Standing Status:   Future    Standing Expiration Date:   07/07/2018    Order Specific Question:   Reason for Exam (SYMPTOM  OR DIAGNOSIS REQUIRED)    Answer:   estrogen deficiency    Order Specific Question:   Preferred imaging location?    Answer:   [facility]  . Pneumococcal conjugate vaccine 13-valent IM  . Comprehensive metabolic panel  . Lipid panel  . CBC with Differential/Platelet  . TSH  . Iron  . Hemoglobin A1c  . Vitamin B12   Meds ordered this encounter  Medications  . cyanocobalamin ((VITAMIN B-12)) injection 1,000 mcg    Return in about 4 weeks (around 06/03/2017) for recheck.   Winna Golla Elayne Guerin, M.D. [facility] [address] [place], [place]  [address] [phone] phone [phone] fax

## 2017-05-06 NOTE — Patient Instructions (Signed)
We recommend that you schedule a mammogram for breast cancer screening. Typically, you do not need a referral to do this. Please contact a local imaging center to schedule your mammogram.  [facility] - [phone]  *ask for the Radiology Department [facility]) - [phone] or [phone]  [facility] - [phone] [facility] - [phone] [facility] - [phone]  *ask for the Radiology Department [facility] - [phone]  *ask for the Radiology Department [facility] - [phone]  *ask for the Mammography Department [facility] - [phone]     IF you received an x-ray today, you will receive an invoice from [facility]. Please contact [facility] at [phone] with questions or concerns regarding your invoice.   IF you received labwork today, you will receive an invoice from [facility]. Please contact [facility] at [phone] with questions or concerns regarding your invoice.   Our billing staff will not be able to assist you with questions regarding bills from these companies.  You will be contacted with the lab results as soon as they are available. The fastest way to get your results is to activate your My Chart account. Instructions are located on the last page of this paperwork. If you have not heard from us regarding the results in 2 weeks, please contact this office.      

## 2017-05-07 LAB — COMPREHENSIVE METABOLIC PANEL
ALT: 22 IU/L (ref 0–32)
AST: 26 IU/L (ref 0–40)
Albumin/Globulin Ratio: 1.7 (ref 1.2–2.2)
Albumin: 4.4 g/dL (ref 3.6–4.8)
Alkaline Phosphatase: 100 IU/L (ref 39–117)
BUN/Creatinine Ratio: 18 (ref 12–28)
BUN: 15 mg/dL (ref 8–27)
Bilirubin Total: 0.4 mg/dL (ref 0.0–1.2)
CALCIUM: 9.1 mg/dL (ref 8.7–10.3)
CO2: 23 mmol/L (ref 20–29)
CREATININE: 0.82 mg/dL (ref 0.57–1.00)
Chloride: 102 mmol/L (ref 96–106)
GFR, EST AFRICAN AMERICAN: 87 mL/min/{1.73_m2} (ref >59)
GFR, EST NON AFRICAN AMERICAN: 75 mL/min/{1.73_m2} (ref >59)
GLOBULIN, TOTAL: 2.6 g/dL (ref 1.5–4.5)
Glucose: 84 mg/dL (ref 65–99)
Potassium: 4.7 mmol/L (ref 3.5–5.2)
SODIUM: 139 mmol/L (ref 134–144)
Total Protein: 7 g/dL (ref 6.0–8.5)

## 2017-05-07 LAB — LIPID PANEL
CHOL/HDL RATIO: 3 ratio (ref 0.0–4.4)
Cholesterol, Total: 158 mg/dL (ref 100–199)
HDL: 53 mg/dL (ref >39)
LDL CALC: 90 mg/dL (ref 0–99)
TRIGLYCERIDES: 76 mg/dL (ref 0–149)
VLDL Cholesterol Cal: 15 mg/dL (ref 5–40)

## 2017-05-07 LAB — HEMOGLOBIN A1C
Est. average glucose Bld gHb Est-mCnc: 128 mg/dL
Hgb A1c MFr Bld: 6.1 % — ABNORMAL HIGH (ref 4.8–5.6)

## 2017-05-07 LAB — CBC WITH DIFFERENTIAL/PLATELET
BASOS ABS: 0 10*3/uL (ref 0.0–0.2)
Basos: 0 %
EOS (ABSOLUTE): 0.1 10*3/uL (ref 0.0–0.4)
Eos: 1 %
Hematocrit: 38.6 % (ref 34.0–46.6)
Hemoglobin: 11.9 g/dL (ref 11.1–15.9)
Immature Grans (Abs): 0 10*3/uL (ref 0.0–0.1)
Immature Granulocytes: 0 %
LYMPHS ABS: 1.6 10*3/uL (ref 0.7–3.1)
Lymphs: 23 %
MCH: 24 pg — AB (ref 26.6–33.0)
MCHC: 30.8 g/dL — AB (ref 31.5–35.7)
MCV: 78 fL — ABNORMAL LOW (ref 79–97)
Monocytes Absolute: 0.7 10*3/uL (ref 0.1–0.9)
Monocytes: 10 %
NEUTROS ABS: 4.6 10*3/uL (ref 1.4–7.0)
Neutrophils: 66 %
PLATELETS: 308 10*3/uL (ref 150–379)
RBC: 4.95 x10E6/uL (ref 3.77–5.28)
RDW: 15.9 % — AB (ref 12.3–15.4)
WBC: 7 10*3/uL (ref 3.4–10.8)

## 2017-05-07 LAB — VITAMIN B12: Vitamin B-12: >2000 pg/mL — ABNORMAL HIGH (ref 232–1245)

## 2017-05-07 LAB — IRON: Iron: 45 ug/dL (ref 27–139)

## 2017-05-07 LAB — TSH: TSH: 2.08 u[IU]/mL (ref 0.450–4.500)

## 2017-05-12 ENCOUNTER — Ambulatory Visit
Admission: RE | Admit: 2017-05-12 | Discharge: 2017-05-12 | Disposition: A | Discharge status: Home / Self Care | Payer: Medicare HMO | Source: Ambulatory Visit | Attending: Family Medicine | Admitting: Family Medicine

## 2017-05-12 DIAGNOSIS — Z1231 Encounter for screening mammogram for malignant neoplasm of breast: Secondary | ICD-10-CM | POA: Insufficient documentation

## 2017-05-26 NOTE — Progress Notes (Signed)
Subjective:    Patient ID: Sonya [name], female    DOB: 10/03/1950, 67 y.o.   MRN: [id number]  05/27/2017  Follow-up ([place] another [place]. Here for vit B shot) and Medication Refill (needing refill on Allegra and Lasix)    HPI This 67 y.o. female presents for evaluation of three week follow-up of vitamin b12 deficiency. No iron for two months.   Leaving tomorrow to move to [place].  Staying the night in [facility] and leave Friday morning.   Movers have come.  Everything is there.  Movers came this week.  Flew in and rented a [place].  Took belongings to storage unit.  Really hurting.     BP Readings from Last 3 Encounters:  05/27/17 126/84  05/06/17 118/68  04/21/17 112/70   Wt Readings from Last 3 Encounters:  05/27/17 177 lb (80.3 kg)  05/06/17 180 lb 9.6 oz (81.9 kg)  04/21/17 179 lb (81.2 kg)   Immunization History  Administered Date(s) Administered  . Influenza Split 11/10/2011  . Influenza,inj,Quad PF,6+ Mos 12/20/2012, 10/26/2013, 12/01/2014, 01/02/2016, 12/03/2016  . Influenza-Unspecified 11/14/2009, 12/25/2010  . Pneumococcal Conjugate-13 05/06/2017  . Pneumococcal Polysaccharide-23 01/02/2016  . Pneumococcal-Unspecified 12/18/2009  . Tdap 12/25/2010  . Zoster 02/24/2014    Review of Systems  Constitutional: Negative for chills, diaphoresis, fatigue and fever.  HENT: Positive for sneezing.   Eyes: Negative for visual disturbance.  Respiratory: Negative for cough and shortness of breath.   Cardiovascular: Negative for chest pain, palpitations and leg swelling.  Gastrointestinal: Negative for abdominal pain, constipation, diarrhea, nausea and vomiting.  Endocrine: Negative for cold intolerance, heat intolerance, polydipsia, polyphagia and polyuria.  Musculoskeletal: Positive for back pain.  Neurological: Negative for dizziness, tremors, seizures, syncope, facial asymmetry, speech difficulty, weakness, light-headedness, numbness and headaches.    Psychiatric/Behavioral: Positive for dysphoric mood. The patient is nervous/anxious.     Past Medical History:  Diagnosis Date  . Allergic rhinitis, cause unspecified   . Anemia   . Anxiety   . Arthritis   . Benign neoplasm of colon   . Bruising   . Chest pain    a. 2015 Neg MV.  . Chronic pain syndrome   . Degeneration of lumbar or lumbosacral intervertebral disc   . Depressive disorder, not elsewhere classified   . Edema   . Emphysema   . GERD (gastroesophageal reflux disease)   . Hematuria, unspecified   . Hiatal hernia   . HLD (hyperlipidemia)   . Hyperglycemia   . Insomnia, unspecified   . Lumbosacral spondylosis without myelopathy   . Neurotic excoriations   . Osteoporosis, unspecified   . PAF (paroxysmal atrial fibrillation) ([place])    a. 08/2015 in setting of asp pna-->converted on dilt. Xarelto started (CHA2DS2VASc= 1).  . Personal history of urinary calculi   . Pneumonia   . Prurigo nodularis   . Pulmonary hypertension ([place])    a. 08/2015 Echo: EF 50-55%, no rwma, gr1 DD, mild MR, PASP 84mmHg.   Marland Kitchen Pure hypercholesterolemia   . Scoliosis (and kyphoscoliosis), idiopathic   . Squamous cell carcinoma   . Tobacco use disorder   . Unspecified hypothyroidism   . Unspecified vitamin D deficiency   . UTI (urinary tract infection)    frequent   Past Surgical History:  Procedure Laterality Date  . 2 harrington rods in back  1980  . ABDOMINAL HYSTERECTOMY     endometriosis; ovaries resected; no cancer.  [place] Anes  2006   both feet  .  CATARACT EXTRACTION, BILATERAL    . [place]  . COLONOSCOPY WITH PROPOFOL N/A 11/21/2016   Procedure: COLONOSCOPY WITH PROPOFOL;  Surgeon: Manya Silvas, MD;  [facility];  Service: Endoscopy;  Laterality: N/A;  . ESOPHAGOGASTRODUODENOSCOPY N/A 11/21/2016   Procedure: ESOPHAGOGASTRODUODENOSCOPY (EGD);  Surgeon: Manya Silvas, MD;  Location: [facility] ENDOSCOPY;  Service: Endoscopy;   Laterality: N/A;  . EYE SURGERY    . JOINT REPLACEMENT  03/16/12   L TKR; R TKR  . lumbar spine surgery  [place]  . OTHER SURGICAL HISTORY  11/29   spinal stimulator removed   . squamous cell carcinoma resection  02/2010   [name]  . TONSILLECTOMY AND ADENOIDECTOMY  1964  . VAGINAL HYSTERECTOMY  1990   Allergies  Allergen Reactions  . Latex Other (See Comments)    OPEN SORES  . Levofloxacin   . Sulfonamide Derivatives    Current Outpatient Medications on File Prior to Visit  Medication Sig Dispense Refill  . albuterol (PROVENTIL HFA;VENTOLIN HFA) 108 (90 Base) MCG/ACT inhaler Inhale 2 puffs into the lungs every 6 (six) hours as needed. 3 Inhaler 1  . clonazePAM (KLONOPIN) 0.5 MG tablet Take 1 tablet (0.5 mg total) by mouth 3 (three) times daily as needed for anxiety. 270 tablet 1  . diltiazem (CARDIZEM CD) 240 MG 24 hr capsule Take 1 capsule (240 mg total) by mouth daily. 90 capsule 3  . DULoxetine (CYMBALTA) 60 MG capsule Take 1 capsule (60 mg total) by mouth daily. 90 capsule 1  . ferrous sulfate 325 (65 FE) MG EC tablet Take 1 tablet (325 mg total) by mouth 3 (three) times daily with meals. 90 tablet 3  . fluticasone (FLONASE) 50 MCG/ACT nasal spray Place 2 sprays into both nostrils daily. 48 g 3  . levothyroxine (SYNTHROID) 50 MCG tablet Take 1 tablet (50 mcg total) by mouth daily before breakfast. 90 tablet 3  . linaclotide (LINZESS) 290 MCG CAPS capsule Take 1 capsule (290 mcg total) by mouth daily before breakfast. 90 capsule 3  . montelukast (SINGULAIR) 10 MG tablet Take 10 mg by mouth at bedtime.  11  . OXYGEN Inhale 2 L into the lungs at bedtime.    . pantoprazole (PROTONIX) 40 MG tablet Take 1 tablet (40 mg total) by mouth daily at 12 noon. 90 tablet 3  . predniSONE (DELTASONE) 20 MG tablet Take 3 PO QAM x 1 day, 2 PO QAM x 5 days, 1 PO QAM x 5 days 18 tablet 0  . rivaroxaban (XARELTO) 20 MG TABS tablet Take 1 tablet (20 mg total) by mouth daily with supper. 90  tablet 3  . rosuvastatin (CRESTOR) 20 MG tablet Take 1 tablet (20 mg total) by mouth daily. 90 tablet 3  . traMADol (ULTRAM) 50 MG tablet Take 100 mg by mouth every 6 (six) hours as needed.    . zolpidem (AMBIEN CR) 12.5 MG CR tablet Take 1 tablet (12.5 mg total) by mouth at bedtime as needed for sleep. 90 tablet 1   No current facility-administered medications on file prior to visit.    Social History   Socioeconomic History  . Marital status: Married    Spouse name: Danyla Wattley  . Number of children: 1  . Years of education: 67  . Highest education level: Not on file  Occupational History  . Occupation: disabled    Comment: 1993   DDD  Social Needs  . Emergency planning/management officer  strain: Not on file  . Food insecurity:    Worry: Not on file    Inability: Not on file  . Transportation needs:    Medical: Not on file    Non-medical: Not on file  Tobacco Use  . Smoking status: Former Smoker    Packs/day: 1.00    Years: 25.00    Pack years: 25.00    Types: Cigarettes    Start date: 11/05/1984    Last attempt to quit: 11/06/2006    Years since quitting: 10.5  . Smokeless tobacco: Never Used  . Tobacco comment: quit 11/07  Substance and Sexual Activity  . Alcohol use: No    Alcohol/week: 0.0 oz  . Drug use: No  . Sexual activity: Yes    Partners: Male  Lifestyle  . Physical activity:    Days per week: Not on file    Minutes per session: Not on file  . Stress: Not on file  Relationships  . Social connections:    Talks on phone: Not on file    Gets together: Not on file    Attends religious service: Not on file    Active member of club or organization: Not on file    Attends meetings of clubs or organizations: Not on file    Relationship status: Not on file  . Intimate partner violence:    Fear of current or ex partner: Not on file    Emotionally abused: Not on file    Physically abused: Not on file    Forced sexual activity: Not on file  Other Topics Concern  . Not on  file  Social History Narrative   Marital status:  Married since 1992; second marriage; happily married; no abuse.      Children:  1 daughter; 3 stepchildren; 8 grandchildren; 2 gg.   Daughter in [place].  Husband's children in [place].      Employment:  Retired/disabled due to DDD lumbar.       Lives:   Lives with spouse;      Caffeine use cosumes a moderate amount 1 serving per day.      Always uses seat belts.       No guns in home.       Smoke alarm and carbon monoxide detectors in the home.       No Living Will.   Family History  Problem Relation Age of Onset  . Coronary artery disease Brother        stents    SIster stents also  . Heart disease Brother   . Heart failure Brother        CABG  AMI @ 46  . Heart disease Brother   . Heart failure Mother        pacemaker  . Hypertension Mother   . Heart disease Mother        CHF; pacemaker  . Heart failure Father        AMI  . Cancer Father 16       colon cancer  . Heart disease Father        mutliple AMIs  . Cancer Maternal Grandmother        ovarian  . Heart disease Sister 6       cardiac stenting  . Heart disease Brother   . Heart disease Brother   . GI problems Unknown        Family history malignancy, GI tract  . Breast cancer Cousin  Objective:    BP 126/84 (BP Location: Left Arm, Patient Position: Sitting, Cuff Size: Normal)   Pulse 98   Temp 98.4 F (36.9 C) (Oral)   Ht 5' 0.63" (1.54 m)   Wt 177 lb (80.3 kg)   SpO2 100%   BMI 33.85 kg/m  Physical Exam  Constitutional: She is oriented to person, place, and time. She appears well-developed and well-nourished. No distress.  HENT:  Head: Normocephalic and atraumatic.  Right Ear: External ear normal.  Left Ear: External ear normal.  Nose: Nose normal.  Mouth/Throat: Oropharynx is clear and moist.  Eyes: Pupils are equal, round, and reactive to light. Conjunctivae and EOM are normal.  Neck: Normal range of motion. Neck supple. Carotid bruit is not  present. No thyromegaly present.  Cardiovascular: Normal rate, regular rhythm, normal heart sounds and intact distal pulses. Exam reveals no gallop and no friction rub.  No murmur heard. Pulmonary/Chest: Effort normal and breath sounds normal. She has no wheezes. She has no rales.  Abdominal: Soft. Bowel sounds are normal. She exhibits no distension and no mass. There is no tenderness. There is no rebound and no guarding.  Lymphadenopathy:    She has no cervical adenopathy.  Neurological: She is alert and oriented to person, place, and time. No cranial nerve deficit.  Skin: Skin is warm and dry. No rash noted. She is not diaphoretic. No erythema. No pallor.  Psychiatric: She has a normal mood and affect. Her behavior is normal.   No results found. Depression screen [facility] 2/9 05/27/2017 05/06/2017 03/04/2017 01/28/2017 12/31/2016  Decreased Interest 0 0 0 2 0  Down, Depressed, Hopeless 0 0 0 3 0  PHQ - 2 Score 0 0 0 5 0  Altered sleeping - - - 2 -  Tired, decreased energy - - - 3 -  Change in appetite - - - 3 -  Feeling bad or failure about yourself  - - - 0 -  Trouble concentrating - - - 0 -  Moving slowly or fidgety/restless - - - 0 -  Suicidal thoughts - - - 0 -  PHQ-9 Score - - - 13 -  Difficult doing work/chores - - - Somewhat difficult -   Fall Risk  05/27/2017 05/06/2017 03/04/2017 01/28/2017 12/31/2016  Falls in the past year? No No No No No  Number falls in past yr: - - - - -  Injury with [place]:   1. Atrial fibrillation with RVR ([place])   2. Moderate persistent asthma with acute exacerbation   3. Glucose intolerance (impaired glucose tolerance)   4. Hypothyroidism due to acquired atrophy of thyroid   5. Anxiety and depression   6. Vitamin B12 deficiency     Stable chronic medical conditions including atrial fibrillation with rapid ventricular response, moderate persistent asthma, glucose intolerance, hypothyroidism, anxiety with  depression, vitamin B12 deficiency.  Status post B12 injection in office today.  Refills of chronic medications provided as well.  I wish patient the very best with her future endeavors in [place].  No orders of the defined types were placed in this encounter.  Meds ordered this encounter  Medications  . cyanocobalamin ((VITAMIN B-12)) injection 1,000 mcg  . furosemide (LASIX) 40 MG tablet    Sig: Take 1 tablet (40 mg total) by mouth daily as needed.    Dispense:  90 tablet  Refill:  3  . potassium chloride SA (K-DUR,KLOR-CON) 20 MEQ tablet    Sig: Take 1 tablet (20 mEq total) by mouth daily.    Dispense:  90 tablet    Refill:  3  . fexofenadine (ALLEGRA) 180 MG tablet    Sig: Take 1 tablet (180 mg total) by mouth daily.    Dispense:  90 tablet    Refill:  3    No follow-ups on file.   Chivon Lepage Elayne Guerin, M.D. Primary Care at [facility] previously [facility] [address] [place], [place]  [address] [phone] phone [phone] fax

## 2017-05-27 ENCOUNTER — Encounter: Payer: Self-pay | Admitting: Family Medicine

## 2017-05-27 ENCOUNTER — Ambulatory Visit (INDEPENDENT_AMBULATORY_CARE_PROVIDER_SITE_OTHER): Payer: Medicare HMO | Admitting: Family Medicine

## 2017-05-27 ENCOUNTER — Other Ambulatory Visit: Payer: Self-pay

## 2017-05-27 VITALS — BP 126/84 | HR 98 | Temp 98.4°F | Ht 60.63 in | Wt 177.0 lb

## 2017-05-27 DIAGNOSIS — E034 Atrophy of thyroid (acquired): Secondary | ICD-10-CM | POA: Diagnosis not present

## 2017-05-27 DIAGNOSIS — J4541 Moderate persistent asthma with (acute) exacerbation: Secondary | ICD-10-CM | POA: Diagnosis not present

## 2017-05-27 DIAGNOSIS — M961 Postlaminectomy syndrome, not elsewhere classified: Secondary | ICD-10-CM | POA: Diagnosis not present

## 2017-05-27 DIAGNOSIS — F32A Depression, unspecified: Secondary | ICD-10-CM

## 2017-05-27 DIAGNOSIS — F329 Major depressive disorder, single episode, unspecified: Secondary | ICD-10-CM

## 2017-05-27 DIAGNOSIS — R7302 Impaired glucose tolerance (oral): Secondary | ICD-10-CM

## 2017-05-27 DIAGNOSIS — I4891 Unspecified atrial fibrillation: Secondary | ICD-10-CM | POA: Diagnosis not present

## 2017-05-27 DIAGNOSIS — G894 Chronic pain syndrome: Secondary | ICD-10-CM | POA: Diagnosis not present

## 2017-05-27 DIAGNOSIS — M549 Dorsalgia, unspecified: Secondary | ICD-10-CM | POA: Diagnosis not present

## 2017-05-27 DIAGNOSIS — R69 Illness, unspecified: Secondary | ICD-10-CM | POA: Diagnosis not present

## 2017-05-27 DIAGNOSIS — E538 Deficiency of other specified B group vitamins: Secondary | ICD-10-CM

## 2017-05-27 DIAGNOSIS — F419 Anxiety disorder, unspecified: Secondary | ICD-10-CM | POA: Diagnosis not present

## 2017-05-27 MED ORDER — FEXOFENADINE HCL 180 MG PO TABS: 180.0000 mg | ORAL_TABLET | Freq: Every day | ORAL | 3 refills | Status: AC

## 2017-05-27 MED ORDER — POTASSIUM CHLORIDE CRYS ER 20 MEQ PO TBCR: 20.0000 meq | EXTENDED_RELEASE_TABLET | Freq: Every day | ORAL | 3 refills | Status: AC

## 2017-05-27 MED ORDER — FUROSEMIDE 40 MG PO TABS: 40.0000 mg | ORAL_TABLET | Freq: Every day | ORAL | 3 refills | Status: AC | PRN

## 2017-05-27 MED ORDER — CYANOCOBALAMIN 1000 MCG/ML IJ SOLN: 1000.0000 ug | INTRAMUSCULAR | Status: AC

## 2017-05-27 NOTE — Patient Instructions (Signed)
     IF you received an x-ray today, you will receive an invoice from [facility]. Please contact [facility] at [phone] with questions or concerns regarding your invoice.   IF you received labwork today, you will receive an invoice from [facility]. Please contact [facility] at [phone] with questions or concerns regarding your invoice.   Our billing staff will not be able to assist you with questions regarding bills from these companies.  You will be contacted with the lab results as soon as they are available. The fastest way to get your results is to activate your My Chart account. Instructions are located on the last page of this paperwork. If you have not heard from us regarding the results in 2 weeks, please contact this office.     

## 2017-05-28 ENCOUNTER — Other Ambulatory Visit: Payer: Medicare HMO

## 2017-06-09 ENCOUNTER — Ambulatory Visit: Payer: BLUE CROSS/BLUE SHIELD | Admitting: Psychiatry

## 2017-06-25 DIAGNOSIS — E785 Hyperlipidemia, unspecified: Secondary | ICD-10-CM | POA: Diagnosis not present

## 2017-06-25 DIAGNOSIS — I4892 Unspecified atrial flutter: Secondary | ICD-10-CM | POA: Diagnosis not present

## 2017-06-25 DIAGNOSIS — M549 Dorsalgia, unspecified: Secondary | ICD-10-CM | POA: Diagnosis not present

## 2017-06-25 DIAGNOSIS — E039 Hypothyroidism, unspecified: Secondary | ICD-10-CM | POA: Diagnosis not present

## 2017-06-30 DIAGNOSIS — E785 Hyperlipidemia, unspecified: Secondary | ICD-10-CM | POA: Diagnosis not present

## 2017-06-30 DIAGNOSIS — I4891 Unspecified atrial fibrillation: Secondary | ICD-10-CM | POA: Diagnosis not present

## 2017-06-30 DIAGNOSIS — I10 Essential (primary) hypertension: Secondary | ICD-10-CM | POA: Diagnosis not present

## 2017-06-30 DIAGNOSIS — R9431 Abnormal electrocardiogram [ECG] [EKG]: Secondary | ICD-10-CM | POA: Diagnosis not present

## 2017-07-09 DIAGNOSIS — Z0000 Encounter for general adult medical examination without abnormal findings: Secondary | ICD-10-CM | POA: Diagnosis not present

## 2017-07-09 DIAGNOSIS — I4892 Unspecified atrial flutter: Secondary | ICD-10-CM | POA: Diagnosis not present

## 2017-07-09 DIAGNOSIS — R69 Illness, unspecified: Secondary | ICD-10-CM | POA: Diagnosis not present

## 2017-07-09 DIAGNOSIS — Z79899 Other long term (current) drug therapy: Secondary | ICD-10-CM | POA: Diagnosis not present

## 2017-07-09 DIAGNOSIS — E785 Hyperlipidemia, unspecified: Secondary | ICD-10-CM | POA: Diagnosis not present

## 2017-07-09 DIAGNOSIS — M1990 Unspecified osteoarthritis, unspecified site: Secondary | ICD-10-CM | POA: Diagnosis not present

## 2017-07-09 DIAGNOSIS — E039 Hypothyroidism, unspecified: Secondary | ICD-10-CM | POA: Diagnosis not present

## 2017-07-14 DIAGNOSIS — M25542 Pain in joints of left hand: Secondary | ICD-10-CM | POA: Diagnosis not present

## 2017-07-14 DIAGNOSIS — G8929 Other chronic pain: Secondary | ICD-10-CM | POA: Diagnosis not present

## 2017-07-14 DIAGNOSIS — G4733 Obstructive sleep apnea (adult) (pediatric): Secondary | ICD-10-CM | POA: Diagnosis not present

## 2017-07-14 DIAGNOSIS — M25541 Pain in joints of right hand: Secondary | ICD-10-CM | POA: Diagnosis not present

## 2017-07-14 DIAGNOSIS — I4891 Unspecified atrial fibrillation: Secondary | ICD-10-CM | POA: Diagnosis not present

## 2017-07-14 DIAGNOSIS — G5603 Carpal tunnel syndrome, bilateral upper limbs: Secondary | ICD-10-CM | POA: Diagnosis not present

## 2017-07-21 ENCOUNTER — Encounter: Payer: Self-pay | Admitting: Family Medicine

## 2017-07-23 DIAGNOSIS — M8589 Other specified disorders of bone density and structure, multiple sites: Secondary | ICD-10-CM | POA: Diagnosis not present
